# Patient Record
Sex: Female | Born: 1935
Health system: Southern US, Community
[De-identification: ages and names within clinical notes are randomized; demographics above are authoritative.]

## PROBLEM LIST (undated history)

## (undated) DIAGNOSIS — M7120 Synovial cyst of popliteal space [Baker], unspecified knee: Secondary | ICD-10-CM

## (undated) DIAGNOSIS — Z85528 Personal history of other malignant neoplasm of kidney: Secondary | ICD-10-CM

## (undated) DIAGNOSIS — I2699 Other pulmonary embolism without acute cor pulmonale: Secondary | ICD-10-CM

## (undated) DIAGNOSIS — I10 Essential (primary) hypertension: Secondary | ICD-10-CM

## (undated) DIAGNOSIS — E7800 Pure hypercholesterolemia, unspecified: Secondary | ICD-10-CM

## (undated) DIAGNOSIS — M1990 Unspecified osteoarthritis, unspecified site: Secondary | ICD-10-CM

## (undated) DIAGNOSIS — J189 Pneumonia, unspecified organism: Secondary | ICD-10-CM

## (undated) DIAGNOSIS — N289 Disorder of kidney and ureter, unspecified: Secondary | ICD-10-CM

## (undated) DIAGNOSIS — R112 Nausea with vomiting, unspecified: Secondary | ICD-10-CM

## (undated) DIAGNOSIS — Z9889 Other specified postprocedural states: Secondary | ICD-10-CM

## (undated) DIAGNOSIS — C801 Malignant (primary) neoplasm, unspecified: Secondary | ICD-10-CM

## (undated) DIAGNOSIS — I739 Peripheral vascular disease, unspecified: Secondary | ICD-10-CM

## (undated) DIAGNOSIS — J449 Chronic obstructive pulmonary disease, unspecified: Secondary | ICD-10-CM

## (undated) DIAGNOSIS — M4800 Spinal stenosis, site unspecified: Secondary | ICD-10-CM

## (undated) DIAGNOSIS — I82409 Acute embolism and thrombosis of unspecified deep veins of unspecified lower extremity: Secondary | ICD-10-CM

## (undated) HISTORY — PX: LEG SURGERY: SHX1003

## (undated) HISTORY — DX: Other pulmonary embolism without acute cor pulmonale: I26.99

## (undated) HISTORY — PX: BACK SURGERY: SHX140

## (undated) HISTORY — DX: Spinal stenosis, site unspecified: M48.00

## (undated) HISTORY — PX: HERNIA REPAIR: SHX51

## (undated) HISTORY — PX: APPENDECTOMY: SHX54

## (undated) HISTORY — PX: ABDOMINAL HYSTERECTOMY: SHX81

---

## 1999-07-06 ENCOUNTER — Encounter: Payer: Self-pay | Admitting: Urology

## 1999-07-06 ENCOUNTER — Encounter: Admission: RE | Admit: 1999-07-06 | Discharge: 1999-07-06 | Payer: Self-pay | Admitting: Urology

## 1999-07-14 ENCOUNTER — Encounter: Payer: Self-pay | Admitting: Urology

## 1999-07-15 ENCOUNTER — Ambulatory Visit (HOSPITAL_COMMUNITY): Admission: RE | Admit: 1999-07-15 | Discharge: 1999-07-15 | Payer: Self-pay | Admitting: Urology

## 2008-03-20 HISTORY — PX: KIDNEY SURGERY: SHX687

## 2010-11-21 ENCOUNTER — Emergency Department (HOSPITAL_COMMUNITY)
Admission: EM | Admit: 2010-11-21 | Discharge: 2010-11-21 | Disposition: A | Discharge status: Home / Self Care | Payer: Medicare Other | Attending: Emergency Medicine | Admitting: Emergency Medicine

## 2010-11-21 DIAGNOSIS — F17200 Nicotine dependence, unspecified, uncomplicated: Secondary | ICD-10-CM | POA: Insufficient documentation

## 2010-11-21 DIAGNOSIS — B029 Zoster without complications: Secondary | ICD-10-CM

## 2010-11-21 DIAGNOSIS — Z85528 Personal history of other malignant neoplasm of kidney: Secondary | ICD-10-CM | POA: Insufficient documentation

## 2010-11-21 DIAGNOSIS — I10 Essential (primary) hypertension: Secondary | ICD-10-CM | POA: Insufficient documentation

## 2010-11-21 DIAGNOSIS — Z7982 Long term (current) use of aspirin: Secondary | ICD-10-CM | POA: Insufficient documentation

## 2010-11-21 DIAGNOSIS — Z86718 Personal history of other venous thrombosis and embolism: Secondary | ICD-10-CM | POA: Insufficient documentation

## 2010-11-21 DIAGNOSIS — M79609 Pain in unspecified limb: Secondary | ICD-10-CM | POA: Insufficient documentation

## 2010-11-21 DIAGNOSIS — E7800 Pure hypercholesterolemia, unspecified: Secondary | ICD-10-CM | POA: Insufficient documentation

## 2010-11-21 HISTORY — DX: Personal history of other malignant neoplasm of kidney: Z85.528

## 2010-11-21 HISTORY — DX: Acute embolism and thrombosis of unspecified deep veins of unspecified lower extremity: I82.409

## 2010-11-21 HISTORY — DX: Essential (primary) hypertension: I10

## 2010-11-21 MED ORDER — VALACYCLOVIR HCL 1 G PO TABS: 1000.0000 mg | ORAL_TABLET | Freq: Three times a day (TID) | ORAL | Status: AC

## 2010-11-21 MED ORDER — PREDNISONE 10 MG PO TABS: ORAL_TABLET | ORAL | Status: AC

## 2010-11-21 MED ORDER — HYDROCODONE-ACETAMINOPHEN 5-325 MG PO TABS: 2.0000 | ORAL_TABLET | Freq: Four times a day (QID) | ORAL | Status: AC | PRN

## 2010-11-21 MED ORDER — FAMOTIDINE 20 MG PO TABS: 20.0000 mg | ORAL_TABLET | Freq: Two times a day (BID) | ORAL | Status: DC

## 2010-11-21 NOTE — ED Notes (Signed)
Pt has a hx of a blood clot to her rt leg.  Pt woke this a.m with severe pain to her left leg.  Daughter noticed that her foot was colder to touch than the other.  Pt also reports a bruise to the back of her left leg that wasn't there yesterday.

## 2010-11-21 NOTE — ED Notes (Signed)
Pt has noted red area approx size of quarter on distal knee. Pt states this just started yesterday. Is painful like stinging at times.

## 2010-11-21 NOTE — ED Provider Notes (Signed)
Scribed for Dr. Tomi Bamberger, the patient was seen in room 17. This chart was scribed by Hortense Ramal. This patient's care was started at 15:00.   History    CSN: [id number] Arrival date & time: 11/21/2010  2:07 PM  Chief Complaint  Patient presents with  . Leg Pain   Patient is a 75 y.o. female presenting with leg pain. The history is provided by the patient.  Leg Pain  Pertinent negatives include no numbness, no inability to bear weight, no loss of motion, no muscle weakness, no loss of sensation and no tingling.    NECOLA TROLL is a 75 y.o. female who presents to the Emergency Department complaining of left leg pain. She has had some pain in this leg chronically and dx with osteoarthritis. The pain in her left calf was worse when she woke up this morning and found a red, flat rash at the site of pain. She denies associated fevers, she is ambulatory and pain is not worse with walking.   She has a hx of DVT in vein of right leg with surgery and stent placement 2010 at [facility] ( the vein was a vein donated by her left leg), Fem Pop bypass on right ; cancer in both kidneys, she has NKDA. PCP: Dr. Delphina Cahill (cardio, Dr. Meredith Pel at [facility]).  PT reports having chicken pox as a child.   Daughter is concerned she has another clot in her leg. Pt has been off plavix and has been on ASA 325 mg a day.   Past Medical History  Diagnosis Date  . History of kidney cancer   . Hypertension   . DVT (deep venous thrombosis)   Peripheral vascular disease High cholesterol   Past Surgical History  Procedure Date  . Abdominal hysterectomy   . Hernia repair   . Appendectomy   . Kidney surgery     History  Substance Use Topics  . Smoking status: Current Everyday Smoker  . Smokeless tobacco: Not on file  . Alcohol Use: No  No drug use  Medications ASA 325 mg daily Simvastin 20 mg daily Lisinopril/HCTZ 20/12.5 mg daily  Review of Systems  Constitutional: Negative for fever.    Respiratory: Negative for shortness of breath.   Cardiovascular: Negative for chest pain.  Musculoskeletal: Positive for arthralgias (chronically). Negative for back pain, joint swelling and gait problem.  Neurological: Negative for tingling and numbness.  All other systems reviewed and are negative.    Physical Exam  BP 117/56  Pulse 79  Temp(Src) 98.6 F (37 C) (Oral)  Resp 18  Ht 8" (0.203 m)  Wt 168 lb (76.204 kg)  BMI 1,845.57 kg/m2  SpO2 99%  Physical Exam  Nursing note and vitals reviewed. Constitutional: She is oriented to person, place, and time. She appears well-developed and well-nourished.       Awake, alert, nontoxic appearance.  HENT:  Head: Normocephalic and atraumatic.  Eyes: Conjunctivae and EOM are normal. Pupils are equal, round, and reactive to light. Right eye exhibits no discharge. Left eye exhibits no discharge.  Neck: Normal range of motion. Neck supple.  Cardiovascular: Normal rate, regular rhythm and normal heart sounds.   No murmur heard. Pulmonary/Chest: Effort normal. No respiratory distress. She has no wheezes. She has no rales. She exhibits no tenderness.  Abdominal: There is no tenderness. There is no rebound.  Musculoskeletal: She exhibits no tenderness.       Baseline ROM, no obvious new focal weakness. Her back is without tenderness  or  visible rash. Pt has a cluster of purplish discoloration of the posterior left calf with small purplish round areas surrounding it that appears to be early shingles. Has a red area lateral to her left knee and two small red areas on the prox medial thigh.Both feet are cool to touch and get warmer as get closer to the knee, no discoloration, toes not pale, cyanotic. Pulses palpable. No swelling in her calf, foot, ankles.   Neurological: She is alert and oriented to person, place, and time.       Mental status and motor strength appears baseline for patient and situation.  Skin: Skin is warm, dry and intact. Rash  noted.       See muscular exam  Psychiatric: She has a normal mood and affect.        ED Course  Procedures  DIAGNOSTIC STUDIES: Oxygen Saturation is 99% on room air, normal by my interpretation.    PROCEDURES: Doppler pulses done by nursing staff and are present.  16:25. Discussed discharge instructions and course of care with patient and family at bedside.  MDM: I feel this rash is most consistant with early shingles. She does not have acute vascular compromise on exam.  IMPRESSION: Diagnoses that have been ruled out:  Diagnoses that are still under consideration:  Final diagnoses:  Shingles    PLAN:  Discharge home I have reviewed the discharge instructions with the patient  CONDITION ON DISCHARGE: stable  DISCHARGE MEDICATIONS: New Prescriptions   FAMOTIDINE (PEPCID) 20 MG TABLET    Take 1 tablet (20 mg total) by mouth 2 (two) times daily.   HYDROCODONE-ACETAMINOPHEN (NORCO) 5-325 MG PER TABLET    Take 2 tablets by mouth every 6 (six) hours as needed for pain.   PREDNISONE (DELTASONE) 10 MG TABLET    Take 3 po QD x 3 d then 2 po QD x 3 d then 1 po QD x 3d   VALACYCLOVIR (VALTREX) 1000 MG TABLET    Take 1 tablet (1,000 mg total) by mouth 3 (three) times daily.    I personally performed the services described in this documentation, which was scribed in my presence. The recorded information has been reviewed and considered. Rolland Porter, MD, Abram Sander        Janice Norrie, MD 11/21/10 [phone]

## 2010-11-21 NOTE — ED Notes (Signed)
Pt presents with lt leg pain; states is in her calf, pulses equally strong bilaterally with feet cool to touch. 2010 pt reports DVT in rt leg. Pt states leg continues to ache and pain has been increasing.  No warmth or redness noted in lt leg.

## 2010-12-19 DIAGNOSIS — I82409 Acute embolism and thrombosis of unspecified deep veins of unspecified lower extremity: Secondary | ICD-10-CM

## 2010-12-19 DIAGNOSIS — I2699 Other pulmonary embolism without acute cor pulmonale: Secondary | ICD-10-CM

## 2010-12-19 HISTORY — DX: Other pulmonary embolism without acute cor pulmonale: I26.99

## 2010-12-19 HISTORY — DX: Acute embolism and thrombosis of unspecified deep veins of unspecified lower extremity: I82.409

## 2011-01-09 ENCOUNTER — Encounter (HOSPITAL_COMMUNITY): Payer: Self-pay | Admitting: Emergency Medicine

## 2011-01-09 ENCOUNTER — Inpatient Hospital Stay (HOSPITAL_COMMUNITY)
Admission: EM | Admit: 2011-01-09 | Discharge: 2011-01-14 | DRG: 176 | Disposition: A | Discharge status: Home / Self Care | Payer: Medicare Other | Attending: Internal Medicine | Admitting: Internal Medicine

## 2011-01-09 ENCOUNTER — Emergency Department (HOSPITAL_COMMUNITY): Payer: Medicare Other

## 2011-01-09 ENCOUNTER — Other Ambulatory Visit: Payer: Self-pay

## 2011-01-09 DIAGNOSIS — Z905 Acquired absence of kidney: Secondary | ICD-10-CM

## 2011-01-09 DIAGNOSIS — I824Y9 Acute embolism and thrombosis of unspecified deep veins of unspecified proximal lower extremity: Secondary | ICD-10-CM | POA: Diagnosis present

## 2011-01-09 DIAGNOSIS — R1110 Vomiting, unspecified: Secondary | ICD-10-CM | POA: Diagnosis present

## 2011-01-09 DIAGNOSIS — Z85528 Personal history of other malignant neoplasm of kidney: Secondary | ICD-10-CM

## 2011-01-09 DIAGNOSIS — I517 Cardiomegaly: Secondary | ICD-10-CM

## 2011-01-09 DIAGNOSIS — I2699 Other pulmonary embolism without acute cor pulmonale: Principal | ICD-10-CM

## 2011-01-09 DIAGNOSIS — T40605A Adverse effect of unspecified narcotics, initial encounter: Secondary | ICD-10-CM | POA: Diagnosis present

## 2011-01-09 DIAGNOSIS — I10 Essential (primary) hypertension: Secondary | ICD-10-CM | POA: Diagnosis present

## 2011-01-09 DIAGNOSIS — Z86718 Personal history of other venous thrombosis and embolism: Secondary | ICD-10-CM

## 2011-01-09 DIAGNOSIS — R112 Nausea with vomiting, unspecified: Secondary | ICD-10-CM | POA: Diagnosis present

## 2011-01-09 DIAGNOSIS — R109 Unspecified abdominal pain: Secondary | ICD-10-CM | POA: Diagnosis present

## 2011-01-09 DIAGNOSIS — E785 Hyperlipidemia, unspecified: Secondary | ICD-10-CM | POA: Diagnosis present

## 2011-01-09 DIAGNOSIS — M5430 Sciatica, unspecified side: Secondary | ICD-10-CM | POA: Diagnosis present

## 2011-01-09 DIAGNOSIS — N179 Acute kidney failure, unspecified: Secondary | ICD-10-CM | POA: Diagnosis present

## 2011-01-09 DIAGNOSIS — C649 Malignant neoplasm of unspecified kidney, except renal pelvis: Secondary | ICD-10-CM | POA: Insufficient documentation

## 2011-01-09 HISTORY — DX: Pure hypercholesterolemia, unspecified: E78.00

## 2011-01-09 LAB — URINALYSIS, ROUTINE W REFLEX MICROSCOPIC
Glucose, UA: NEGATIVE mg/dL
Hgb urine dipstick: NEGATIVE
Ketones, ur: NEGATIVE mg/dL
Protein, ur: NEGATIVE mg/dL
pH: 6 (ref 5.0–8.0)

## 2011-01-09 LAB — HEPATIC FUNCTION PANEL
ALT: 7 U/L (ref 0–35)
AST: 16 U/L (ref 0–37)
Alkaline Phosphatase: 99 U/L (ref 39–117)
Bilirubin, Direct: 0.2 mg/dL (ref 0.0–0.3)
Indirect Bilirubin: 0.4 mg/dL (ref 0.3–0.9)
Total Protein: 7.7 g/dL (ref 6.0–8.3)

## 2011-01-09 LAB — DIFFERENTIAL
Eosinophils Relative: 1 % (ref 0–5)
Lymphocytes Relative: 11 % — ABNORMAL LOW (ref 12–46)
Lymphs Abs: 0.8 10*3/uL (ref 0.7–4.0)
Monocytes Absolute: 0.7 10*3/uL (ref 0.1–1.0)
Monocytes Relative: 10 % (ref 3–12)

## 2011-01-09 LAB — BASIC METABOLIC PANEL
CO2: 26 mEq/L (ref 19–32)
Calcium: 9.7 mg/dL (ref 8.4–10.5)
Creatinine, Ser: 1.78 mg/dL — ABNORMAL HIGH (ref 0.50–1.10)
Glucose, Bld: 127 mg/dL — ABNORMAL HIGH (ref 70–99)

## 2011-01-09 LAB — CARDIAC PANEL(CRET KIN+CKTOT+MB+TROPI)
Relative Index: INVALID (ref 0.0–2.5)
Total CK: 71 U/L (ref 7–177)

## 2011-01-09 LAB — CBC
HCT: 37.5 % (ref 36.0–46.0)
Hemoglobin: 12.2 g/dL (ref 12.0–15.0)
MCV: 94.2 fL (ref 78.0–100.0)
RBC: 3.98 MIL/uL (ref 3.87–5.11)
WBC: 7.3 10*3/uL (ref 4.0–10.5)

## 2011-01-09 LAB — APTT: aPTT: 35 seconds (ref 24–37)

## 2011-01-09 MED ORDER — BIOTENE DRY MOUTH MT LIQD
Freq: Two times a day (BID) | OROMUCOSAL | Status: DC
  Administered 2011-01-09 – 2011-01-12 (×5): via OROMUCOSAL
  Administered 2011-01-12: 1 via OROMUCOSAL
  Administered 2011-01-13: 08:00:00 via OROMUCOSAL
  Administered 2011-01-13: 1 via OROMUCOSAL
  Administered 2011-01-14: 08:00:00 via OROMUCOSAL

## 2011-01-09 MED ORDER — WARFARIN SODIUM 7.5 MG PO TABS
7.5000 mg | ORAL_TABLET | Freq: Once | ORAL | Status: AC
  Administered 2011-01-09: 7.5 mg via ORAL
  Filled 2011-01-09: qty 1

## 2011-01-09 MED ORDER — SODIUM CHLORIDE 0.9 % IJ SOLN
INTRAMUSCULAR | Status: AC
  Administered 2011-01-09: 3 mL
  Filled 2011-01-09: qty 3

## 2011-01-09 MED ORDER — ASPIRIN EC 325 MG PO TBEC
325.0000 mg | DELAYED_RELEASE_TABLET | Freq: Every day | ORAL | Status: DC
  Administered 2011-01-09 – 2011-01-11 (×3): 325 mg via ORAL
  Filled 2011-01-09 (×3): qty 1

## 2011-01-09 MED ORDER — SODIUM CHLORIDE 0.9 % IV SOLN: Freq: Once | INTRAVENOUS | Status: DC

## 2011-01-09 MED ORDER — SIMVASTATIN 20 MG PO TABS
20.0000 mg | ORAL_TABLET | Freq: Every day | ORAL | Status: DC
  Administered 2011-01-09 – 2011-01-13 (×5): 20 mg via ORAL
  Filled 2011-01-09 (×5): qty 1

## 2011-01-09 MED ORDER — ONDANSETRON HCL 4 MG PO TABS
4.0000 mg | ORAL_TABLET | Freq: Four times a day (QID) | ORAL | Status: DC | PRN
  Filled 2011-01-09: qty 1

## 2011-01-09 MED ORDER — ACETAMINOPHEN 650 MG RE SUPP: 650.0000 mg | Freq: Four times a day (QID) | RECTAL | Status: DC | PRN

## 2011-01-09 MED ORDER — ACETAMINOPHEN 325 MG PO TABS
650.0000 mg | ORAL_TABLET | Freq: Four times a day (QID) | ORAL | Status: DC | PRN
  Administered 2011-01-11 – 2011-01-14 (×6): 650 mg via ORAL
  Filled 2011-01-09 (×6): qty 2

## 2011-01-09 MED ORDER — IOHEXOL 300 MG/ML  SOLN
80.0000 mL | Freq: Once | INTRAMUSCULAR | Status: AC | PRN
  Administered 2011-01-09: 80 mL via INTRAVENOUS

## 2011-01-09 MED ORDER — HEPARIN BOLUS VIA INFUSION
4000.0000 [IU] | Freq: Once | INTRAVENOUS | Status: AC
  Administered 2011-01-09: 4000 [IU] via INTRAVENOUS
  Filled 2011-01-09: qty 4000

## 2011-01-09 MED ORDER — HEPARIN BOLUS VIA INFUSION: 4000.0000 [IU] | Freq: Once | INTRAVENOUS | Status: DC

## 2011-01-09 MED ORDER — HYDROCODONE-ACETAMINOPHEN 5-325 MG PO TABS
1.0000 | ORAL_TABLET | ORAL | Status: DC | PRN
  Administered 2011-01-09 (×2): 1 via ORAL
  Administered 2011-01-10 (×2): 2 via ORAL
  Filled 2011-01-09: qty 1
  Filled 2011-01-09 (×2): qty 2
  Filled 2011-01-09: qty 1

## 2011-01-09 MED ORDER — HEPARIN (PORCINE) IN NACL 100-0.45 UNIT/ML-% IJ SOLN
1300.0000 [IU]/h | INTRAMUSCULAR | Status: DC
  Administered 2011-01-10 – 2011-01-12 (×3): 1100 [IU]/h via INTRAVENOUS
  Administered 2011-01-13 (×2): 1300 [IU]/h via INTRAVENOUS
  Filled 2011-01-09 (×5): qty 250

## 2011-01-09 MED ORDER — SODIUM CHLORIDE 0.9 % IV SOLN: INTRAVENOUS | Status: AC

## 2011-01-09 MED ORDER — HYDROMORPHONE HCL 1 MG/ML IJ SOLN
1.0000 mg | Freq: Once | INTRAMUSCULAR | Status: AC
  Administered 2011-01-09: 1 mg via INTRAVENOUS
  Filled 2011-01-09: qty 1

## 2011-01-09 MED ORDER — SODIUM CHLORIDE 0.45 % IV SOLN: INTRAVENOUS | Status: DC

## 2011-01-09 MED ORDER — ONDANSETRON HCL 4 MG/2ML IJ SOLN
4.0000 mg | Freq: Four times a day (QID) | INTRAMUSCULAR | Status: DC | PRN
  Administered 2011-01-10 (×2): 4 mg via INTRAVENOUS
  Filled 2011-01-09 (×2): qty 2

## 2011-01-09 MED ORDER — ONDANSETRON HCL 4 MG/2ML IJ SOLN
4.0000 mg | Freq: Once | INTRAMUSCULAR | Status: AC
  Administered 2011-01-09: 4 mg via INTRAVENOUS
  Filled 2011-01-09: qty 2

## 2011-01-09 MED ORDER — SODIUM CHLORIDE 0.9 % IV BOLUS (SEPSIS)
1000.0000 mL | Freq: Once | INTRAVENOUS | Status: AC
  Administered 2011-01-09: 1000 mL via INTRAVENOUS

## 2011-01-09 MED ORDER — FAMOTIDINE 20 MG PO TABS
20.0000 mg | ORAL_TABLET | Freq: Two times a day (BID) | ORAL | Status: DC | PRN
  Filled 2011-01-09 (×2): qty 1

## 2011-01-09 MED ORDER — ONDANSETRON HCL 4 MG/2ML IJ SOLN
4.0000 mg | Freq: Once | INTRAMUSCULAR | Status: AC
Start: 2011-01-09 — End: 2011-01-09
  Administered 2011-01-09: 4 mg via INTRAVENOUS
  Filled 2011-01-09: qty 2

## 2011-01-09 MED ORDER — FAMOTIDINE 20 MG PO TABS
20.0000 mg | ORAL_TABLET | Freq: Two times a day (BID) | ORAL | Status: DC
  Administered 2011-01-09 – 2011-01-14 (×11): 20 mg via ORAL
  Filled 2011-01-09 (×9): qty 1

## 2011-01-09 MED ORDER — HEPARIN (PORCINE) IN NACL 100-0.45 UNIT/ML-% IJ SOLN
18.0000 [IU]/kg/h | INTRAMUSCULAR | Status: DC
  Administered 2011-01-09: 18 [IU]/kg/h via INTRAVENOUS
  Filled 2011-01-09: qty 250

## 2011-01-09 NOTE — ED Notes (Signed)
Pt taken to ct. Nad.  

## 2011-01-09 NOTE — ED Notes (Signed)
Dr Warnell Forester talking with family now.

## 2011-01-09 NOTE — Progress Notes (Signed)
ANTICOAGULATION CONSULT NOTE - Initial Consult  Pharmacy Consult for Heparin and Warfarin Indication: pulmonary embolus  No Known Allergies  Patient Measurements: Height: 5\' 5"  (165.1 cm) Weight: 169 lb 8 oz (76.885 kg) IBW/kg (Calculated) : 57  Adjusted Body Weight:77kg  Vital Signs: Temp: 98 F (36.7 C) (10/22 1351) Temp src: Oral (10/22 0627) BP: 156/79 mmHg (10/22 1351) Pulse Rate: 77  (10/22 1351)  Labs:  Basename 01/09/11 1019 01/09/11 0630  HGB -- 12.2  HCT -- 37.5  PLT -- 147*  APTT 35 --  LABPROT 14.5 --  INR 1.11 --  HEPARINUNFRC -- --  CREATININE -- 1.78*  CKTOTAL -- --  CKMB -- --  TROPONINI -- --   Estimated Creatinine Clearance: 29.3 ml/min (by C-G formula based on Cr of 1.78).  Medical History: Past Medical History  Diagnosis Date  . History of kidney cancer   . Hypertension   . DVT (deep venous thrombosis)   . Hypercholesteremia   . Osteoporosis     Medications:   Reviewed  Assessment: Initial anticoagulation for PE. Heparin started in ED  Goal of Therapy:  INR 2-3 Heparin level 0.3- 0.7    Plan:   Heparin 4000 units bolus followed by 1400 units per hour. Warfarin 7.5mg  today. Minimum 5 day overlap with INR over 2.0 for 24 hours, Daily Protime Daily Heparin level. Daily CBC  Lisa Lucas, [place] J 01/09/2011,2:22 PM

## 2011-01-09 NOTE — Progress Notes (Signed)
ANTICOAGULATION CONSULT NOTE - Initial Consult  Pharmacy Consult for Heparin Indication: pulmonary embolus  No Known Allergies  Patient Measurements: Height: 5' 5.5" (166.4 cm) Weight: 169 lb (76.658 kg) IBW/kg (Calculated) : 58.15  Adjusted Body Weight: 76  Vital Signs: Temp: 98.6 F (37 C) (10/22 0627) Temp src: Oral (10/22 0627) BP: 123/64 mmHg (10/22 0921) Pulse Rate: 74  (10/22 0921)  Labs:  Basename 01/09/11 0630  HGB 12.2  HCT 37.5  PLT 147*  APTT --  LABPROT --  INR --  HEPARINUNFRC --  CREATININE 1.78*  CKTOTAL --  CKMB --  TROPONINI --   Estimated Creatinine Clearance: 29.6 ml/min (by C-G formula based on Cr of 1.78).  Medical History: Past Medical History  Diagnosis Date  . History of kidney cancer   . Hypertension   . DVT (deep venous thrombosis)   . Hypercholesteremia   . Osteoporosis     Medications:  Medications Prior to Admission  Medication Dose Route Frequency Provider Last Rate Last Dose  . 0.45 % sodium chloride infusion   Intravenous STAT Maudry Diego, MD      . 0.9 %  sodium chloride infusion   Intravenous Once Maudry Diego, MD      . heparin 100 units/mL bolus via infusion 4,000 Units  4,000 Units Intravenous Once Maudry Diego, MD      . HYDROmorphone (DILAUDID) injection 1 mg  1 mg Intravenous Once Maudry Diego, MD   1 mg at 01/09/11 0739  . iohexol (OMNIPAQUE) 300 MG/ML injection 80 mL  80 mL Intravenous Once PRN Hind I. Elsaid   80 mL at 01/09/11 0853  . ondansetron (ZOFRAN) injection 4 mg  4 mg Intravenous Once Maudry Diego, MD   4 mg at 01/09/11 0738  . ondansetron (ZOFRAN) injection 4 mg  4 mg Intravenous Once Maudry Diego, MD   4 mg at 01/09/11 1014  . sodium chloride 0.9 % bolus 1,000 mL  1,000 mL Intravenous Once Hind I. Elsaid   1,000 mL at 01/09/11 [id number]   Medications Prior to Admission  Medication Sig Dispense Refill  . aspirin 325 MG EC tablet Take 325 mg by mouth daily.        . Calcium  Carbonate-Vitamin D (CALCIUM 600 + D PO) Take 1 tablet by mouth daily.        . Calcium-Magnesium-Zinc 1000-400-15 MG TABS Take 1 tablet by mouth daily.        . famotidine (PEPCID) 20 MG tablet Take 1 tablet (20 mg total) by mouth 2 (two) times daily.  30 tablet  0  . lisinopril-hydrochlorothiazide (PRINZIDE,ZESTORETIC) 20-12.5 MG per tablet Take 1 tablet by mouth daily.        Marland Kitchen OVER THE COUNTER MEDICATION Apply 1 patch topically daily as needed. OTC: FAMILY CARE PAIN RELIEF PATCH: for pain       . simvastatin (ZOCOR) 20 MG tablet Take 20 mg by mouth at bedtime.        . valACYclovir (VALTREX) 1000 MG tablet Take 1 tablet (1,000 mg total) by mouth 3 (three) times daily.  21 tablet  0    Assessment: Okay for Protocol No baseline coag labs  Goal of Therapy:  Heparin level 0.3-0.7 units/ml   Plan:  4000 units bolus Drip @ 18 units/kg/hr. Check Heparin Level and CBC in 8 hours and daily while on Heparin. Baseline coag labs.  Biagio Quint R 01/09/2011,10:12 AM

## 2011-01-09 NOTE — H&P (Signed)
PCP:   Wende Neighbors, MD   Chief Complaint:  Right flank pain and shortness of breath for 2 days  HPI: This 75 year old female with history of bilateral kidney cancer , sp bilateral partial nephrectomy ,done at [facility] on 2010,history of DVT with some bad reaction to coumadin per daughter, accordingly placed on aspirin and plavix and currently the patient only on aspirin, patient today with sudden onset right flank pain associated with shortness of breath, no nausea or vomiting , no burning micturition, has act scan of abdomen done with contrast which did show pulmonary embolism. Review of Systems:  The patient denies anorexia, fever, weight loss,, vision loss, decreased hearing, hoarseness, chest pain, syncope,  she complains of dyspnea and unable to complete sentence, she also has left leg pain where further investigated at [facility] with as per daughter no clot was found and her peripheral circulation is good, peripheral edema,patient denies any balance deficits, hemoptysis, abdominal pain, melena, hematochezia, severe indigestion/heartburn, hematuria, incontinence, genital sores, muscle weakness, suspicious skin lesions, transient blindness, difficulty walking, depression, unusual weight change, abnormal bleeding, enlarged lymph nodes, angioedema, and breast masses.  Past Medical History: Past Medical History  Diagnosis Date  . History of kidney cancer   . Hypertension   . DVT (deep venous thrombosis)   . Hypercholesteremia   . Osteoporosis History of peripheral vascular disease . Marland Kitchen     Past Surgical History  Procedure Date  . Abdominal hysterectomy   . Hernia repair   . Appendectomy   . Kidney surgery     Medications: Prior to Admission medications   Medication Sig Start Date End Date Taking? Authorizing Provider  aspirin 325 MG EC tablet Take 325 mg by mouth daily.     Yes Historical Provider, MD  Calcium Carbonate-Vitamin D (CALCIUM 600 + D PO) Take 1 tablet by  mouth daily.     Yes Historical Provider, MD  Calcium-Magnesium-Zinc 1000-400-15 MG TABS Take 1 tablet by mouth daily.     Yes Historical Provider, MD  famotidine (PEPCID) 20 MG tablet Take 20 mg by mouth 2 (two) times daily as needed. gas  11/21/10 11/21/11 Yes Janice Norrie, MD  lisinopril-hydrochlorothiazide (PRINZIDE,ZESTORETIC) 20-12.5 MG per tablet Take 1 tablet by mouth daily.     Yes Historical Provider, MD  OVER THE COUNTER MEDICATION Apply 1 patch topically daily as needed. OTC: FAMILY CARE PAIN RELIEF PATCH: for pain    Yes Historical Provider, MD  simvastatin (ZOCOR) 20 MG tablet Take 20 mg by mouth at bedtime.     Yes Historical Provider, MD  valACYclovir (VALTREX) 1000 MG tablet Take 1 tablet (1,000 mg total) by mouth 3 (three) times daily. 11/21/10 11/21/11  Janice Norrie, MD    Allergies:  No Known Allergies  Social History:  reports that she quit smoking about 4 weeks ago. She does not have any smokeless tobacco history on file. She reports that she does not drink alcohol or use illicit drugs.lives with her daughter , she is a widow  Family History: history of hypertension, diabetes Physical Exam:lying on bed ,not on respiratory distress or active shortness brearh HEENT: no IVP, NO lymph node LUNG: NORMAL VESICULAR BREATHING  HEART": S1and S2 , no gallop ,no murmur ABD: soft non tender ,bowel sound present. No rebound   Extremities: no edema. No cyanosis , pulse intact CNS: awake and fully oriented , no neuro deficit.    Filed Vitals:   01/09/11 [id number] 01/09/11 [id number] 01/09/11 [id number] 01/09/11 1127  BP: 144/85 123/64  103/52  Pulse: 91 74  74  Temp: 98.6 F (37 C)     TempSrc: Oral     Resp:  19  18  Height: 5\' 8"  (1.727 m)  5' 5.5" (1.664 m)   Weight: 74.844 kg (165 lb)  76.658 kg (169 lb)   SpO2: 96% 97%  98%      Labs on Admission:   [facility] 01/09/11 0630  NA 136  K 4.6  CL 101  CO2 26  GLUCOSE 127*  BUN 30*  CREATININE 1.78*  CALCIUM 9.7  MG --  PHOS --     Basename 01/09/11 0732  AST 16  ALT 7  ALKPHOS 99  BILITOT 0.6  PROT 7.7  ALBUMIN 3.6    Basename 01/09/11 0732  LIPASE 23  AMYLASE --    Basename 01/09/11 0630  WBC 7.3  NEUTROABS 5.8  HGB 12.2  HCT 37.5  MCV 94.2  PLT 147*   No results found for this basename: CKTOTAL:3,CKMB:3,CKMBINDEX:3,TROPONINI:3 in the last 72 hours No results found for this basename: TSH,T4TOTAL,FREET3,T3FREE,THYROIDAB in the last 72 hours No results found for this basename: VITAMINB12:2,FOLATE:2,FERRITIN:2,TIBC:2,IRON:2,RETICCTPCT:2 in the last 72 hours  Radiological Exams on Admission: Ct Abdomen Pelvis W Contrast  01/09/2011  *RADIOLOGY REPORT*  Clinical Data: RLQ/right flank pain  CT ABDOMEN AND PELVIS WITH CONTRAST  Technique:  Multidetector CT imaging of the abdomen and pelvis was performed following the standard protocol during bolus administration of intravenous contrast.  Contrast: 79mL OMNIPAQUE IOHEXOL 300 MG/ML IV SOLN  Comparison: None.  Findings: Pulmonary embolus at the bifurcation of the main pulmonary artery extending to the right lower lobe pulmonary artery (series 2/image 3).  Mild patchy opacity in the right lower lobe.  Contrast within the distal esophagus, possibly reflecting esophageal dysmotility or gastroesophageal reflux.  Liver, pancreas, and adrenal glands are grossly unremarkable.  2.3 x 3.0 cm hypoenhancing lesion in the spleen (series 2/image 27), incompletely characterized, likely benign hemangioma or lymphangioma.  Cholelithiasis (series 2/image 29), without associated inflammatory changes.  1.5 x 2.3 cm cystic lesion minimally complicated by a rim calcification in the anterior left upper pole (series 2/image 41). Additional tiny probable renal cysts and areas of cortical scarring/atrophy.  No renal calculi or hydronephrosis.  No evidence of bowel obstruction.  Appendix is not discretely visualized and may be surgically absent.  Extensive colonic diverticulosis, without  associated inflammatory changes.  Atherosclerotic calcifications of the abdominal aorta and branch vessels.  No abdominopelvic ascites.  No suspicious abdominopelvic lymphadenopathy.  Status post hysterectomy.  No adnexal masses.  No ureteral or bladder calculi.  Large left bladder diverticulum.  At least three midline/right paramidline ventral hernias containing fat.  Mild degenerative changes of the visualized thoracolumbar spine.  IMPRESSION:  Pulmonary embolus at the bifurcation of the main pulmonary artery and extending into the right lower lobe pulmonary artery.  Critical Value/emergent results were called by telephone at the time of interpretation on 01/09/2011  at 0900 hours  to  Dr. Milton Ferguson, who verbally acknowledged these results.  Original Report Authenticated By: Julian Hy, M.D.    Assessment/Plan Present on Admission:  .Pulmonary embolism: IV heparin and coumadin will be started , will observe any reaction to coumadin ,she need to be on long term Anticoagulation if she develop reaction will switch to Lovenox. .Abdominal  pain, other specified site: likely referral from her pulmonary embolism , she has cyst on the kidney will get [place] of kidney , and venous doppler of  lower extremities  .Renal failure, acute baseline unknown , monitor  Vomiting likely reaction to dilaudid , Zofran added .HTN (hypertension)home medications  Ary Lavine I. 01/09/2011, 12:04 PM

## 2011-01-09 NOTE — ED Notes (Signed)
Per EMS - pt from home - pt c/o rt abd/flank pain and shortness of breath that began approx 2 days ago.

## 2011-01-09 NOTE — ED Notes (Signed)
Daughter had questions. Advised Dr. Warnell Forester to come down and address family's questions.

## 2011-01-09 NOTE — Progress Notes (Signed)
*  PRELIMINARY RESULTS* Echocardiogram 2D Echocardiogram has been performed.  Tera Partridge 01/09/2011, 2:33 PM

## 2011-01-09 NOTE — ED Notes (Signed)
Called Dr. Laurie Panda  Hospitalist.

## 2011-01-09 NOTE — ED Provider Notes (Signed)
History   This chart was scribed for Maudry Diego, MD by Carolyne Littles. The patient was seen in room APA05/APA05 and the patient's care was started at 7:19AM.   CSN: [id number] Arrival date & time: 01/09/2011  6:33 AM   First MD Initiated Contact with Patient 01/09/11 [phone]      Chief Complaint  Patient presents with  . Abdominal Pain  . Flank Pain  . Shortness of Breath   HPI Lisa Lucas is a 75 y.o. female who presents to the Emergency Department complaining of constant,non-radiating sharp RLQ abdominal and right flank pain onset yesterday and gradually worsening since with associated mild productive cough. Notes abdominal pain is aggravated by palpation and deep breathing and relieved by nothing. Denies dysuria, nausea, vomiting, diarrhea. Patient's family reports h/o appendectomy and bilateral partial nephrectomies performed at [facility].   PCP- Dr. Nevada Crane  HPI ELEMENTS:  Location: RLQ and right flank  Onset: yesterday Duration: persistent since onset  Timing: constant  Quality: sharp  Modifying factors: agg by deep breath and palpation Context:  as above  Associated symptoms: +productive cough. Denies dysuria.    Past Medical History  Diagnosis Date  . History of kidney cancer   . Hypertension   . DVT (deep venous thrombosis)   . Hypercholesteremia   . Osteoporosis     Past Surgical History  Procedure Date  . Abdominal hysterectomy   . Hernia repair   . Appendectomy   . Kidney surgery     No family history on file.  History  Substance Use Topics  . Smoking status: Former Smoker    Quit date: 12/10/2010  . Smokeless tobacco: Not on file  . Alcohol Use: No    OB History    Grav Para Term Preterm Abortions TAB SAB Ect Mult Living                  Review of Systems  Constitutional: Negative for fatigue.  HENT: Negative for congestion, sinus pressure and ear discharge.   Eyes: Negative for discharge.  Respiratory: Positive for cough.     Cardiovascular: Negative for chest pain.  Gastrointestinal: Positive for abdominal pain. Negative for diarrhea.  Genitourinary: Negative for dysuria, frequency and hematuria.  Musculoskeletal: Negative for back pain.  Skin: Negative for rash.  Neurological: Negative for seizures and headaches.  Hematological: Negative.   Psychiatric/Behavioral: Negative for hallucinations.    Allergies  Review of patient's allergies indicates no known allergies.  Home Medications   Current Outpatient Rx  Name Route Sig Dispense Refill  . ASPIRIN 325 MG PO TBEC Oral Take 325 mg by mouth daily.      Marland Kitchen CALCIUM 600 + D PO Oral Take 1 tablet by mouth daily.      Marland Kitchen CALCIUM-MAGNESIUM-ZINC 1000-400-15 MG PO TABS Oral Take 1 tablet by mouth daily.      Marland Kitchen FAMOTIDINE 20 MG PO TABS Oral Take 1 tablet (20 mg total) by mouth 2 (two) times daily. 30 tablet 0  . LISINOPRIL-HYDROCHLOROTHIAZIDE 20-12.5 MG PO TABS Oral Take 1 tablet by mouth daily.      Marland Kitchen OVER THE COUNTER MEDICATION Topical Apply 1 patch topically daily as needed. OTC: FAMILY CARE PAIN RELIEF PATCH: for pain     . SIMVASTATIN 20 MG PO TABS Oral Take 20 mg by mouth at bedtime.      Marland Kitchen VALACYCLOVIR HCL 1 G PO TABS Oral Take 1 tablet (1,000 mg total) by mouth 3 (three) times daily. 21  tablet 0    BP 123/64  Pulse 74  Temp(Src) 98.6 F (37 C) (Oral)  Resp 19  Ht 5\' 8"  (1.727 m)  Wt 165 lb (74.844 kg)  BMI 25.09 kg/m2  SpO2 97%  Physical Exam  Nursing note and vitals reviewed. Constitutional: She is oriented to person, place, and time. She appears well-developed and well-nourished. No distress.  HENT:  Head: Normocephalic and atraumatic.  Eyes: EOM are normal. No scleral icterus.  Neck: Neck supple. No thyromegaly present.  Cardiovascular: Normal rate, regular rhythm and intact distal pulses.  Exam reveals no gallop and no friction rub.   No murmur heard. Pulmonary/Chest: Effort normal. No stridor. She has wheezes. She has no rales. She  exhibits no tenderness.  Abdominal: She exhibits no distension. There is tenderness (moderate) in the right lower quadrant. There is rebound.       Right flank tenderness.   Musculoskeletal: Normal range of motion. She exhibits no edema.  Lymphadenopathy:    She has no cervical adenopathy.  Neurological: She is alert and oriented to person, place, and time. Coordination normal.  Skin: Skin is warm and dry.  Psychiatric: She has a normal mood and affect. Her behavior is normal.    ED Course  Procedures (including critical care time)  DIAGNOSTIC STUDIES:   COORDINATION OF CARE: 9:33AM- Patient and family informed of lab and imaging results and cause of abdominal pain. Also informed of intent to admit to hospital and administer blood thinners to treat PE. Patient's family and patient agree with plan set forth at this time. Patient family member notes patient with h/o blood clots and recently had study performed to rule out DVT in bilateral lower legs following a surgical procedure.   Labs Reviewed  CBC - Abnormal; Notable for the following:    Platelets 147 (*)    All other components within normal limits  DIFFERENTIAL - Abnormal; Notable for the following:    Neutrophils Relative 79 (*)    Lymphocytes Relative 11 (*)    All other components within normal limits  BASIC METABOLIC PANEL - Abnormal; Notable for the following:    Glucose, Bld 127 (*)    BUN 30 (*)    Creatinine, Ser 1.78 (*)    GFR calc non Af Amer 27 (*)    GFR calc Af Amer 32 (*)    All other components within normal limits  URINALYSIS, ROUTINE W REFLEX MICROSCOPIC - Abnormal; Notable for the following:    Appearance HAZY (*)    All other components within normal limits  HEPATIC FUNCTION PANEL  LIPASE, BLOOD   Ct Abdomen Pelvis W Contrast  01/09/2011  *RADIOLOGY REPORT*  Clinical Data: RLQ/right flank pain  CT ABDOMEN AND PELVIS WITH CONTRAST  Technique:  Multidetector CT imaging of the abdomen and pelvis was  performed following the standard protocol during bolus administration of intravenous contrast.  Contrast: 38mL OMNIPAQUE IOHEXOL 300 MG/ML IV SOLN  Comparison: None.  Findings: Pulmonary embolus at the bifurcation of the main pulmonary artery extending to the right lower lobe pulmonary artery (series 2/image 3).  Mild patchy opacity in the right lower lobe.  Contrast within the distal esophagus, possibly reflecting esophageal dysmotility or gastroesophageal reflux.  Liver, pancreas, and adrenal glands are grossly unremarkable.  2.3 x 3.0 cm hypoenhancing lesion in the spleen (series 2/image 27), incompletely characterized, likely benign hemangioma or lymphangioma.  Cholelithiasis (series 2/image 29), without associated inflammatory changes.  1.5 x 2.3 cm cystic lesion minimally complicated by  a rim calcification in the anterior left upper pole (series 2/image 41). Additional tiny probable renal cysts and areas of cortical scarring/atrophy.  No renal calculi or hydronephrosis.  No evidence of bowel obstruction.  Appendix is not discretely visualized and may be surgically absent.  Extensive colonic diverticulosis, without associated inflammatory changes.  Atherosclerotic calcifications of the abdominal aorta and branch vessels.  No abdominopelvic ascites.  No suspicious abdominopelvic lymphadenopathy.  Status post hysterectomy.  No adnexal masses.  No ureteral or bladder calculi.  Large left bladder diverticulum.  At least three midline/right paramidline ventral hernias containing fat.  Mild degenerative changes of the visualized thoracolumbar spine.  IMPRESSION:  Pulmonary embolus at the bifurcation of the main pulmonary artery and extending into the right lower lobe pulmonary artery.  Critical Value/emergent results were called by telephone at the time of interpretation on 01/09/2011  at 0900 hours  to  Dr. Milton Ferguson, who verbally acknowledged these results.  Original Report Authenticated By: Julian Hy,  M.D.     No diagnosis found.    MDM  abd pain.  From pe  CRITICAL CARE Performed by: Brodan Grewell L   Total critical care time: 45 min  Critical care time was exclusive of separately billable procedures and treating other patients.  Critical care was necessary to treat or prevent imminent or life-threatening deterioration.  Critical care was time spent personally by me on the following activities: development of treatment plan with patient and/or surrogate as well as nursing, discussions with consultants, evaluation of patient's response to treatment, examination of patient, obtaining history from patient or surrogate, ordering and performing treatments and interventions, ordering and review of laboratory studies, ordering and review of radiographic studies, pulse oximetry and re-evaluation of patient's condition.     The chart was scribed for me under my direct supervision.  I personally performed the history, physical, and medical decision making and all procedures in the evaluation of this patient.Maudry Diego, MD 01/09/11 [phone]

## 2011-01-09 NOTE — ED Notes (Signed)
Pt resting. Waiting on result of test and to be eval by edp. Family at bedside

## 2011-01-10 ENCOUNTER — Inpatient Hospital Stay (HOSPITAL_COMMUNITY): Payer: Medicare Other

## 2011-01-10 LAB — PROTIME-INR
INR: 1.32 (ref 0.00–1.49)
Prothrombin Time: 16.6 seconds — ABNORMAL HIGH (ref 11.6–15.2)

## 2011-01-10 LAB — COMPREHENSIVE METABOLIC PANEL
BUN: 25 mg/dL — ABNORMAL HIGH (ref 6–23)
CO2: 26 mEq/L (ref 19–32)
Calcium: 9.2 mg/dL (ref 8.4–10.5)
Chloride: 102 mEq/L (ref 96–112)
Creatinine, Ser: 1.51 mg/dL — ABNORMAL HIGH (ref 0.50–1.10)
GFR calc Af Amer: 39 mL/min — ABNORMAL LOW (ref >90)
GFR calc non Af Amer: 33 mL/min — ABNORMAL LOW (ref >90)
Glucose, Bld: 97 mg/dL (ref 70–99)
Total Bilirubin: 0.5 mg/dL (ref 0.3–1.2)

## 2011-01-10 LAB — CBC
HCT: 32.5 % — ABNORMAL LOW (ref 36.0–46.0)
Hemoglobin: 10.5 g/dL — ABNORMAL LOW (ref 12.0–15.0)
MCH: 30.7 pg (ref 26.0–34.0)
MCHC: 32.3 g/dL (ref 30.0–36.0)
MCV: 95 fL (ref 78.0–100.0)
RBC: 3.42 MIL/uL — ABNORMAL LOW (ref 3.87–5.11)

## 2011-01-10 LAB — APTT: aPTT: 127 seconds — ABNORMAL HIGH (ref 24–37)

## 2011-01-10 MED ORDER — SODIUM CHLORIDE 0.9 % IJ SOLN
INTRAMUSCULAR | Status: AC
  Filled 2011-01-10: qty 3

## 2011-01-10 MED ORDER — BISACODYL 5 MG PO TBEC: 5.0000 mg | DELAYED_RELEASE_TABLET | Freq: Every day | ORAL | Status: DC | PRN

## 2011-01-10 MED ORDER — SENNOSIDES-DOCUSATE SODIUM 8.6-50 MG PO TABS: 1.0000 | ORAL_TABLET | Freq: Two times a day (BID) | ORAL | Status: DC | PRN

## 2011-01-10 MED ORDER — SODIUM CHLORIDE 0.9 % IJ SOLN
INTRAMUSCULAR | Status: AC
  Administered 2011-01-10: 3 mL
  Filled 2011-01-10: qty 3

## 2011-01-10 MED ORDER — HYDROMORPHONE HCL 1 MG/ML IJ SOLN
0.5000 mg | Freq: Four times a day (QID) | INTRAMUSCULAR | Status: DC | PRN
  Administered 2011-01-10 – 2011-01-12 (×2): 0.5 mg via INTRAVENOUS
  Filled 2011-01-10 (×2): qty 1

## 2011-01-10 MED ORDER — SODIUM CHLORIDE 0.9 % IJ SOLN
INTRAMUSCULAR | Status: AC
  Administered 2011-01-10: 11:00:00
  Filled 2011-01-10: qty 10

## 2011-01-10 MED ORDER — WARFARIN SODIUM 7.5 MG PO TABS
7.5000 mg | ORAL_TABLET | Freq: Once | ORAL | Status: AC
  Administered 2011-01-10: 7.5 mg via ORAL
  Filled 2011-01-10: qty 1

## 2011-01-10 NOTE — Progress Notes (Signed)
UR Chart Review Completed  

## 2011-01-10 NOTE — Progress Notes (Signed)
ANTICOAGULATION CONSULT NOTE -  Consult  Pharmacy Consult for Heparin and Warfarin Indication: pulmonary embolus  No Known Allergies  Patient Measurements: Height: 5\' 5"  (165.1 cm) Weight: 173 lb 11.2 oz (78.79 kg) IBW/kg (Calculated) : 57  Adjusted Body Weight:77kg  Vital Signs: Temp: 98.4 F (36.9 C) (10/23 0552) Temp src: Oral (10/23 0552) BP: 128/77 mmHg (10/23 0552) Pulse Rate: 73  (10/23 0552)  Labs:  Basename 01/10/11 0601 01/10/11 0124 01/09/11 1936 01/09/11 1710 01/09/11 1019 01/09/11 0630  HGB 10.5* -- -- -- -- 12.2  HCT 32.5* -- -- -- -- 37.5  PLT 129* -- -- -- -- 147*  APTT 127* -- -- -- 35 --  LABPROT 16.6* -- -- -- 14.5 --  INR 1.32 -- -- -- 1.11 --  HEPARINUNFRC 0.38 0.40 -- 0.85* -- --  CREATININE 1.51* -- -- -- -- 1.78*  CKTOTAL -- -- 71 -- -- --  CKMB -- -- 2.3 -- -- --  TROPONINI -- -- <0.30 -- -- --   Estimated Creatinine Clearance: 34.9 ml/min (by C-G formula based on Cr of 1.51).  Medical History: Past Medical History  Diagnosis Date  . History of kidney cancer   . Hypertension   . DVT (deep venous thrombosis)   . Hypercholesteremia   . Osteoporosis     Medications:   Reviewed  Assessment: Initial anticoagulation for PE. Heparin level is therapeutic.Heparin rate decreased to 1100 units per hour on 01-09-11. INR sub-therapeutic. Pltc 122K. Daily CBC's ordered. Goal of Therapy:  INR 2-3 Heparin level 0.3- 0.7    Plan:   Continue Heparin at 1100 units per hour. Warfarin 7.5mg  today. Minimum 5 day overlap with INR over 2.0 for 24 hours, Daily Protime Daily Heparin level. Daily CBC  Lisa Lucas J 01/10/2011,10:08 AM

## 2011-01-10 NOTE — Progress Notes (Signed)
CRITICAL VALUE ALERT  Critical value received:  [place] of legs  Date of notification:  01/10/2011  Time of notification:  1700  Critical value read back:yes  Nurse who received alert:  Christinia Gully, RN  MD notified (1st page):  Dr. Laurie Panda  Time of first page:  1705  MD notified (2nd page):Dr. Laurie Panda  Time of second page:1815  Responding MD:  Dr. Laurie Panda  Time MD responded:  [phone]

## 2011-01-10 NOTE — Progress Notes (Signed)
Subjective: Complains of vomiting twice , no abdominal  Pain ,, has no BM since Sunday, still with shortness of breath with minimal activity , she is currently on 3 liter oxygen.  Objective: Vital signs in last 24 hours: Temp:  [97.9 F (36.6 C)-98.4 F (36.9 C)] 98.4 F (36.9 C) (10/23 0552) Pulse Rate:  [73-77] 73  (10/23 0552) Resp:  [18-22] 18  (10/23 0552) BP: (97-156)/(62-79) 128/77 mmHg (10/23 0552) SpO2:  [90 %-97 %] 90 % (10/23 0552) FiO2 (%):  [2 %] 2 % (10/22 1351) Weight:  [76.885 kg (169 lb 8 oz)-78.79 kg (173 lb 11.2 oz)] 173 lb 11.2 oz (78.79 kg) (10/23 0552) Weight change: 1.814 kg (4 lb) Last BM Date: 01/08/11  Intake/Output from previous day: 10/22 0701 - 10/23 0700 In: 168 [P.O.:80; I.V.:88] Out: 200 [Urine:200] Intake/Output this shift: Total I/O In: 120 [P.O.:120] Out: -   ON EXAM:lying on bed ,not on respiratory distress or active shortness brearh  HEENT: no IVP, NO lymph node  LUNG: some raleson the right lung base    HEART": S1and S2 , no gallop ,no murmur  ABD: soft non tender ,bowel sound present. No rebound  Extremities: no edema. No cyanosis , pulse intact  CNS: awake and fully oriented , no neuro deficit.   Lab Results:  Basename 01/10/11 0601 01/09/11 0630  WBC 5.1 7.3  HGB 10.5* 12.2  HCT 32.5* 37.5  PLT 129* 147*   BMET  Basename 01/10/11 0601 01/09/11 0630  NA 135 136  K 4.6 4.6  CL 102 101  CO2 26 26  GLUCOSE 97 127*  BUN 25* 30*  CREATININE 1.51* 1.78*  CALCIUM 9.2 9.7    Studies/Results: Ct Abdomen Pelvis W Contrast  01/09/2011  *RADIOLOGY REPORT*  Clinical Data: RLQ/right flank pain  CT ABDOMEN AND PELVIS WITH CONTRAST  Technique:  Multidetector CT imaging of the abdomen and pelvis was performed following the standard protocol during bolus administration of intravenous contrast.  Contrast: 51mL OMNIPAQUE IOHEXOL 300 MG/ML IV SOLN  Comparison: None.  Findings: Pulmonary embolus at the bifurcation of the main pulmonary  artery extending to the right lower lobe pulmonary artery (series 2/image 3).  Mild patchy opacity in the right lower lobe.  Contrast within the distal esophagus, possibly reflecting esophageal dysmotility or gastroesophageal reflux.  Liver, pancreas, and adrenal glands are grossly unremarkable.  2.3 x 3.0 cm hypoenhancing lesion in the spleen (series 2/image 27), incompletely characterized, likely benign hemangioma or lymphangioma.  Cholelithiasis (series 2/image 29), without associated inflammatory changes.  1.5 x 2.3 cm cystic lesion minimally complicated by a rim calcification in the anterior left upper pole (series 2/image 41). Additional tiny probable renal cysts and areas of cortical scarring/atrophy.  No renal calculi or hydronephrosis.  No evidence of bowel obstruction.  Appendix is not discretely visualized and may be surgically absent.  Extensive colonic diverticulosis, without associated inflammatory changes.  Atherosclerotic calcifications of the abdominal aorta and branch vessels.  No abdominopelvic ascites.  No suspicious abdominopelvic lymphadenopathy.  Status post hysterectomy.  No adnexal masses.  No ureteral or bladder calculi.  Large left bladder diverticulum.  At least three midline/right paramidline ventral hernias containing fat.  Mild degenerative changes of the visualized thoracolumbar spine.  IMPRESSION:  Pulmonary embolus at the bifurcation of the main pulmonary artery and extending into the right lower lobe pulmonary artery.  Critical Value/emergent results were called by telephone at the time of interpretation on 01/09/2011  at 0900 hours  to  Dr. Broadus John  Zammit, who verbally acknowledged these results.  Original Report Authenticated By: Julian Hy, M.D.    Medications:  Continuous:   . sodium chloride    . heparin 1,100 Units/hr (01/10/11 0456)  . DISCONTD: heparin 18 Units/kg/hr (01/09/11 1032)    Assessment/Plan:  1- pulmonary embolism, has history of DVT in the past  ,not on coumadin secondary to reaction?, will get more information from patient MD, venous doppler pending, echo pending, she has rales on the right lower lung likely secondary to infarct ,w ill get CXR, will continue iv heparin and coumadin. Dr Nevada Crane [id number] 2-thrompocytopenia: keep monitor  3- anemia: stool guaiac, anemia panel 4- vomiting cause not clear will observe , abdomen soft and non tender 5-renal insuffiencey: likely chronic with history of kidney ca sp bilateral surgery . 6 - hypoxia  Secondary to PULMONARY EMBOLISM  LOS: 1 day   Theo Reither I. 01/10/2011, 1:05 PM

## 2011-01-10 NOTE — Plan of Care (Signed)
Problem: Phase II Progression Outcomes Goal: Therapeutic drug levels for anticoagulation Outcome: Progressing Still not therapeutic

## 2011-01-11 ENCOUNTER — Inpatient Hospital Stay (HOSPITAL_COMMUNITY): Payer: Medicare Other

## 2011-01-11 LAB — PROTIME-INR
INR: 1.27 (ref 0.00–1.49)
Prothrombin Time: 16.2 seconds — ABNORMAL HIGH (ref 11.6–15.2)

## 2011-01-11 LAB — COMPREHENSIVE METABOLIC PANEL
AST: 13 U/L (ref 0–37)
BUN: 25 mg/dL — ABNORMAL HIGH (ref 6–23)
CO2: 29 mEq/L (ref 19–32)
Calcium: 9.3 mg/dL (ref 8.4–10.5)
Chloride: 101 mEq/L (ref 96–112)
Creatinine, Ser: 1.61 mg/dL — ABNORMAL HIGH (ref 0.50–1.10)
GFR calc Af Amer: 36 mL/min — ABNORMAL LOW (ref >90)
GFR calc non Af Amer: 31 mL/min — ABNORMAL LOW (ref >90)
Glucose, Bld: 90 mg/dL (ref 70–99)
Total Bilirubin: 0.2 mg/dL — ABNORMAL LOW (ref 0.3–1.2)

## 2011-01-11 LAB — CBC
Hemoglobin: 10.7 g/dL — ABNORMAL LOW (ref 12.0–15.0)
MCH: 30.7 pg (ref 26.0–34.0)
Platelets: 151 10*3/uL (ref 150–400)
RBC: 3.49 MIL/uL — ABNORMAL LOW (ref 3.87–5.11)
WBC: 3.9 10*3/uL — ABNORMAL LOW (ref 4.0–10.5)

## 2011-01-11 LAB — HEPARIN LEVEL (UNFRACTIONATED): Heparin Unfractionated: 0.56 IU/mL (ref 0.30–0.70)

## 2011-01-11 MED ORDER — WARFARIN SODIUM 7.5 MG PO TABS
7.5000 mg | ORAL_TABLET | Freq: Once | ORAL | Status: AC
  Administered 2011-01-11: 7.5 mg via ORAL
  Filled 2011-01-11: qty 1

## 2011-01-11 NOTE — Progress Notes (Signed)
Received consult for diet education for warfarin diet restrictions (pt going home on coumadin). Educated pt on diet principles; discussed high vitamin k food sources and importance of consistency of vitamin high k foods in diet.  Also assured pt that hospital menus are planned to comply with this restriction. Provided "Vitamin K and Medication" handout. Expect good compliance. I appreciate the consult.   Joaquim Lai, RD, LDN Pager: [phone]

## 2011-01-11 NOTE — Progress Notes (Signed)
ANTICOAGULATION CONSULT NOTE   Pharmacy Consult for Heparin and Warfarin Indication: pulmonary embolus  No Known Allergies  Patient Measurements: Height: 5\' 5"  (165.1 cm) Weight: 173 lb 11.2 oz (78.79 kg) IBW/kg (Calculated) : 57  Adjusted Body Weight:77kg  Vital Signs: Temp: 98.6 F (37 C) (10/24 0500) Temp src: Oral (10/24 0500) BP: 90/60 mmHg (10/24 0500) Pulse Rate: 68  (10/24 0500)  Labs:  Basename 01/11/11 0447 01/10/11 0601 01/10/11 0124 01/09/11 1936 01/09/11 1019 01/09/11 0630  HGB 10.7* 10.5* -- -- -- --  HCT 33.3* 32.5* -- -- -- 37.5  PLT 151 129* -- -- -- 147*  APTT -- 127* -- -- 35 --  LABPROT 16.2* 16.6* -- -- 14.5 --  INR 1.27 1.32 -- -- 1.11 --  HEPARINUNFRC 0.56 0.38 0.40 -- -- --  CREATININE 1.61* 1.51* -- -- -- 1.78*  CKTOTAL -- -- -- 71 -- --  CKMB -- -- -- 2.3 -- --  TROPONINI -- -- -- <0.30 -- --   Estimated Creatinine Clearance: 32.8 ml/min (by C-G formula based on Cr of 1.61).  Medical History: Past Medical History  Diagnosis Date  . History of kidney cancer   . Hypertension   . DVT (deep venous thrombosis)   . Hypercholesteremia   . Osteoporosis    Medications: Reviewed  Assessment: Initial anticoagulation for PE. Heparin level is therapeutic INR sub-therapeutic. Platelets improved  Goal of Therapy:  INR 2-3 Heparin level 0.3- 0.7   Plan:   Continue Heparin at 1100 units per hour. Warfarin 7.5mg  today. Minimum 5 day overlap with INR over 2.0 for 24 hours, Daily Protime Daily Heparin level. Daily CBC  Glendon Fiser A 01/11/2011,8:25 AM

## 2011-01-11 NOTE — Progress Notes (Signed)
Chart reviewed.  Subjective: No pain. No nausea. No dyspnea at rest. She does feel dyspneic on exertion.  objective: Vital signs in last 24 hours: Filed Vitals:   01/10/11 0552 01/10/11 1449 01/10/11 2057 01/11/11 0500  BP: 128/77 119/74 150/87 90/60  Pulse: 73 68 73 68  Temp: 98.4 F (36.9 C) 97.9 F (36.6 C) 98.5 F (36.9 C) 98.6 F (37 C)  TempSrc: Oral  Oral Oral  Resp: 18 19 18 20   Height:      Weight: 78.79 kg (173 lb 11.2 oz)     SpO2: 90% 91% 93% 97%   Weight change:   Intake/Output Summary (Last 24 hours) at 01/11/11 1444 Last data filed at 01/11/11 0459  Gross per 24 hour  Intake    372 ml  Output      0 ml  Net    372 ml   General: Comfortable. Lungs clear to auscultation bilaterally without wheeze rhonchi or rales Cardiovascular regular rate rhythm without murmurs gallops rubs Abdomen soft nontender nondistended Extremities no clubbing cyanosis or edema  Lab Results: Basic Metabolic Panel:  Lab [id number] 0447 01/10/11 0601  NA 136 135  K 4.8 4.6  CL 101 102  CO2 29 26  GLUCOSE 90 97  BUN 25* 25*  CREATININE 1.61* 1.51*  CALCIUM 9.3 9.2  MG -- --  PHOS -- --   Liver Function Tests:  Lab 01/11/11 0447 01/10/11 0601  AST 13 14  ALT 5 5  ALKPHOS 79 79  BILITOT 0.2* 0.5  PROT 6.3 6.3  ALBUMIN 2.9* 3.0*    Lab 01/09/11 0732  LIPASE 23  AMYLASE --   No results found for this basename: AMMONIA:2 in the last 168 hours CBC:  Lab 01/11/11 0447 01/10/11 0601 01/09/11 0630  WBC 3.9* 5.1 --  NEUTROABS -- -- 5.8  HGB 10.7* 10.5* --  HCT 33.3* 32.5* --  MCV 95.4 95.0 --  PLT 151 129* --   Cardiac Enzymes:  Lab 01/09/11 1936  CKTOTAL 71  CKMB 2.3  CKMBINDEX --  TROPONINI <0.30   BNP: No results found for this basename: POCBNP:3 in the last 168 hours D-Dimer: No results found for this basename: DDIMER:2 in the last 168 hours CBG: No results found for this basename: GLUCAP:6 in the last 168 hours Hemoglobin A1C: No results found for  this basename: HGBA1C in the last 168 hours Fasting Lipid Panel: No results found for this basename: CHOL,HDL,LDLCALC,TRIG,CHOLHDL,LDLDIRECT in the last 168 hours Thyroid Function Tests: No results found for this basename: TSH,T4TOTAL,FREET4,T3FREE,THYROIDAB in the last 168 hours Anemia Panel: No results found for this basename: VITAMINB12,FOLATE,FERRITIN,TIBC,IRON,RETICCTPCT in the last 168 hours   Micro Results: No results found for this or any previous visit (from the past 240 hour(s)). Studies/Results: Dg Chest 2 View  01/11/2011  *RADIOLOGY REPORT*  Clinical Data: Shortness of breath.  CHEST - 2 VIEW 01/11/2011:  Comparison: None.  Findings: Cardiac silhouette normal in size.  Thoracic aorta tortuous and atherosclerotic.  Hilar and mediastinal contours otherwise unremarkable.  Airspace consolidation in the right middle and lower lobes, silhouetting the right heart border and the the right hemidiaphragm.  Probable small associated right pleural effusion.  Left lung clear.  Pulmonary vascularity normal.  Mild degenerative changes involving the thoracic spine.  IMPRESSION: Pneumonia and/or atelectasis involving the right middle lobe and right lower lobe.  Probable small associated right pleural effusion.  Original Report Authenticated By: Deniece Portela, M.D.   US Venous Img Lower Bilateral  01/10/2011  *  RADIOLOGY REPORT*  Clinical Data: Pulmonary embolism.  VENOUS DUPLEX ULTRASOUND OF BILATERAL LOWER EXTREMITIES  Technique:  Gray-scale sonography with graded compression, as well as color Doppler and duplex ultrasound, were performed to evaluate the deep venous system of both lower extremities from the level of the common femoral vein through the popliteal and proximal calf veins.  Spectral Doppler was utilized to evaluate flow at rest and with distal augmentation maneuvers.  Comparison:  None.  Findings: There is evidence of thrombus in the right popliteal vein.  This thrombus causes  significant luminal narrowing but is not completely occlusive to flow.  Left lower extremity shows no evidence of deep venous thrombosis.  Small focal fluid collection in the left popliteal fossa measures approximately 2.3 x 1.2 x  3.0 cm and is consistent with Chovan's cyst.  IMPRESSION: Nonocclusive DVT in the right popliteal vein.  Small left popliteal fossa Ferrufino's cyst.  Original Report Authenticated By: Azzie Roup, M.D.    Scheduled Meds:   . antiseptic oral rinse   Mouth Rinse BID  . aspirin  325 mg Oral Daily  . famotidine  20 mg Oral BID  . simvastatin  20 mg Oral QHS  . sodium chloride      . warfarin  7.5 mg Oral ONCE-1800  . warfarin  7.5 mg Oral ONCE-1800   Continuous Infusions:   . heparin 1,100 Units/hr (01/11/11 0458)   PRN Meds:.acetaminophen, bisacodyl, famotidine, HYDROmorphone (DILAUDID) injection, ondansetron (ZOFRAN) IV, ondansetron, senna-docusate, DISCONTD: acetaminophen, DISCONTD: HYDROcodone-acetaminophen Assessment/Plan: Principal Problem:  *Pulmonary embolism Active Problems:  History of DVT (deep vein thrombosis)  Renal failure, acute  Vomiting secondary to hydrocodone.  Hyperlipidemia  HTN (hypertension) Acute DVT right popliteal vein.  Vomiting resolved. She thinks it was related to pain medication. Her pain is being controlled with Tylenol. She feels slightly dyspneic on exertion. She's only ambulated to the bathroom. No chest pain. No bleeding. Her daughter reports that she had hives and vomiting with Coumadin previously, neither of which she has had during this hospitalization on Coumadin. I will get records from [facility] check a room air saturation.    LOS: 2 days   Lisa Lucas L 01/11/2011, 2:44 PM

## 2011-01-12 LAB — CBC
MCH: 30.7 pg (ref 26.0–34.0)
MCHC: 32.6 g/dL (ref 30.0–36.0)
RBC: 3.62 MIL/uL — ABNORMAL LOW (ref 3.87–5.11)
WBC: 3.9 10*3/uL — ABNORMAL LOW (ref 4.0–10.5)

## 2011-01-12 LAB — PROTIME-INR: INR: 1.25 (ref 0.00–1.49)

## 2011-01-12 MED ORDER — SODIUM CHLORIDE 0.9 % IJ SOLN
INTRAMUSCULAR | Status: AC
  Administered 2011-01-12: 10 mL
  Filled 2011-01-12: qty 10

## 2011-01-12 MED ORDER — GABAPENTIN 600 MG PO TABS
300.0000 mg | ORAL_TABLET | Freq: Every day | ORAL | Status: DC
  Filled 2011-01-12: qty 0.5

## 2011-01-12 MED ORDER — SODIUM CHLORIDE 0.9 % IJ SOLN
INTRAMUSCULAR | Status: AC
  Administered 2011-01-12: 3 mL
  Filled 2011-01-12: qty 3

## 2011-01-12 MED ORDER — WARFARIN SODIUM 10 MG PO TABS
10.0000 mg | ORAL_TABLET | Freq: Once | ORAL | Status: AC
  Administered 2011-01-12: 10 mg via ORAL
  Filled 2011-01-12: qty 1

## 2011-01-12 MED ORDER — GABAPENTIN 300 MG PO CAPS
300.0000 mg | ORAL_CAPSULE | Freq: Every day | ORAL | Status: DC
  Administered 2011-01-12 – 2011-01-13 (×2): 300 mg via ORAL
  Filled 2011-01-12 (×2): qty 1

## 2011-01-12 NOTE — Progress Notes (Signed)
Subjective: No pain. No nausea. No dyspnea at rest. No rash or nausea. Her daughter has multiple questions, mainly about chronic left leg pain which she has been told is from osteoarthritis. Her daughter has asked me to call her vascular surgeon, Dr. Nicola Girt at [facility]. His telephone number is [phone].  objective: Vital signs in last 24 hours: Filed Vitals:   01/11/11 1611 01/11/11 2132 01/12/11 0639 01/12/11 0656  BP:  131/80 134/74   Pulse:  76 72   Temp:  98.2 F (36.8 C) 98.2 F (36.8 C)   TempSrc:  Oral Oral   Resp:  18 18   Height:      Weight:      SpO2: 93% 94% 94% 94%   Weight change:   Intake/Output Summary (Last 24 hours) at 01/12/11 1453 Last data filed at 01/12/11 1021  Gross per 24 hour  Intake    740 ml  Output      1 ml  Net    739 ml   General: Comfortable. Currently getting a bath. Lungs clear to auscultation bilaterally without wheeze rhonchi or rales Cardiovascular regular rate rhythm without murmurs gallops rubs Abdomen soft nontender nondistended Extremities no clubbing cyanosis or edema  Lab Results: Basic Metabolic Panel:  Lab [id number] 0447 01/10/11 0601  NA 136 135  K 4.8 4.6  CL 101 102  CO2 29 26  GLUCOSE 90 97  BUN 25* 25*  CREATININE 1.61* 1.51*  CALCIUM 9.3 9.2  MG -- --  PHOS -- --   Liver Function Tests:  Lab 01/11/11 0447 01/10/11 0601  AST 13 14  ALT 5 5  ALKPHOS 79 79  BILITOT 0.2* 0.5  PROT 6.3 6.3  ALBUMIN 2.9* 3.0*    Lab 01/09/11 0732  LIPASE 23  AMYLASE --   No results found for this basename: AMMONIA:2 in the last 168 hours CBC:  Lab 01/12/11 0539 01/11/11 0447 01/09/11 0630  WBC 3.9* 3.9* --  NEUTROABS -- -- 5.8  HGB 11.1* 10.7* --  HCT 34.1* 33.3* --  MCV 94.2 95.4 --  PLT 163 151 --   Cardiac Enzymes:  Lab 01/09/11 1936  CKTOTAL 71  CKMB 2.3  CKMBINDEX --  TROPONINI <0.30   BNP: No results found for this basename: POCBNP:3 in the last 168 hours D-Dimer: No results found for  this basename: DDIMER:2 in the last 168 hours CBG: No results found for this basename: GLUCAP:6 in the last 168 hours Hemoglobin A1C: No results found for this basename: HGBA1C in the last 168 hours Fasting Lipid Panel: No results found for this basename: CHOL,HDL,LDLCALC,TRIG,CHOLHDL,LDLDIRECT in the last 168 hours Thyroid Function Tests: No results found for this basename: TSH,T4TOTAL,FREET4,T3FREE,THYROIDAB in the last 168 hours Anemia Panel: No results found for this basename: VITAMINB12,FOLATE,FERRITIN,TIBC,IRON,RETICCTPCT in the last 168 hours   Micro Results: No results found for this or any previous visit (from the past 240 hour(s)). Studies/Results: Dg Chest 2 View  01/11/2011  *RADIOLOGY REPORT*  Clinical Data: Shortness of breath.  CHEST - 2 VIEW 01/11/2011:  Comparison: None.  Findings: Cardiac silhouette normal in size.  Thoracic aorta tortuous and atherosclerotic.  Hilar and mediastinal contours otherwise unremarkable.  Airspace consolidation in the right middle and lower lobes, silhouetting the right heart border and the the right hemidiaphragm.  Probable small associated right pleural effusion.  Left lung clear.  Pulmonary vascularity normal.  Mild degenerative changes involving the thoracic spine.  IMPRESSION: Pneumonia and/or atelectasis involving the right middle lobe  and right lower lobe.  Probable small associated right pleural effusion.  Original Report Authenticated By: Deniece Portela, M.D.   US Venous Img Lower Bilateral  01/10/2011  *RADIOLOGY REPORT*  Clinical Data: Pulmonary embolism.  VENOUS DUPLEX ULTRASOUND OF BILATERAL LOWER EXTREMITIES  Technique:  Gray-scale sonography with graded compression, as well as color Doppler and duplex ultrasound, were performed to evaluate the deep venous system of both lower extremities from the level of the common femoral vein through the popliteal and proximal calf veins.  Spectral Doppler was utilized to evaluate flow at rest  and with distal augmentation maneuvers.  Comparison:  None.  Findings: There is evidence of thrombus in the right popliteal vein.  This thrombus causes significant luminal narrowing but is not completely occlusive to flow.  Left lower extremity shows no evidence of deep venous thrombosis.  Small focal fluid collection in the left popliteal fossa measures approximately 2.3 x 1.2 x  3.0 cm and is consistent with Burmester's cyst.  IMPRESSION: Nonocclusive DVT in the right popliteal vein.  Small left popliteal fossa Devereux's cyst.  Original Report Authenticated By: Azzie Roup, M.D.    Scheduled Meds:    . antiseptic oral rinse   Mouth Rinse BID  . famotidine  20 mg Oral BID  . simvastatin  20 mg Oral QHS  . sodium chloride      . sodium chloride      . warfarin  10 mg Oral ONCE-1800  . warfarin  7.5 mg Oral ONCE-1800   Continuous Infusions:    . heparin 1,300 Units/hr (01/12/11 0810)   PRN Meds:.acetaminophen, bisacodyl, famotidine, HYDROmorphone (DILAUDID) injection, ondansetron (ZOFRAN) IV, ondansetron, senna-docusate Assessment/Plan: Principal Problem:  *Pulmonary embolism Active Problems:  History of DVT (deep vein thrombosis)  Renal failure, acute  Vomiting secondary to hydrocodone.  Hyperlipidemia  HTN (hypertension) Acute DVT right popliteal vein.  Patient has had no adverse effects from Coumadin. I am still awaiting records from [facility]. I have left a message with her surgeon, Dr. Nicola Girt he and await a call back. Continue current for now. Her left leg pain is chronic but her daughter has many questions about it. She reports that it happened after some type of vascular procedure. I will speak with Dr. Nicola Girt about it and return to examine the patient further once she is done bathing.   LOS: 3 days   Rosalie Buenaventura L 01/12/2011, 2:53 PM

## 2011-01-12 NOTE — Plan of Care (Signed)
Problem: Phase II Progression Outcomes Goal: Therapeutic drug levels for anticoagulation Heparin levels therapeutic but not coumadin levels

## 2011-01-12 NOTE — Progress Notes (Signed)
ANTICOAGULATION CONSULT NOTE   Pharmacy Consult for Heparin and Warfarin Indication: pulmonary embolus  No Known Allergies  Patient Measurements: Height: 5\' 5"  (165.1 cm) Weight: 173 lb 11.2 oz (78.79 kg) IBW/kg (Calculated) : 57  Adjusted Body Weight:77kg  Vital Signs: Temp: 98.2 F (36.8 C) (10/25 0639) Temp src: Oral (10/25 0639) BP: 134/74 mmHg (10/25 0639) Pulse Rate: 72  (10/25 0639)  Labs:  Basename 01/12/11 0539 01/11/11 0447 01/10/11 0601 01/09/11 1936 01/09/11 1019  HGB 11.1* 10.7* -- -- --  HCT 34.1* 33.3* 32.5* -- --  PLT 163 151 129* -- --  APTT -- -- 127* -- 35  LABPROT 16.0* 16.2* 16.6* -- --  INR 1.25 1.27 1.32 -- --  HEPARINUNFRC 0.25* 0.56 0.38 -- --  CREATININE -- 1.61* 1.51* -- --  CKTOTAL -- -- -- 71 --  CKMB -- -- -- 2.3 --  TROPONINI -- -- -- <0.30 --   Estimated Creatinine Clearance: 32.8 ml/min (by C-G formula based on Cr of 1.61).  Medical History: Past Medical History  Diagnosis Date  . History of kidney cancer   . Hypertension   . DVT (deep venous thrombosis)   . Hypercholesteremia   . Osteoporosis    Medications: Reviewed  Assessment: Initial anticoagulation for PE. Heparin level is below goal today INR sub-therapeutic. Platelets improved  Goal of Therapy:  INR 2-3 Heparin level 0.3- 0.7   Plan: Increase heparin to 1300 units/hr Re-check heparin level in 6 hours Consider switching to full dose Lovenox and stop heparin Coumadin 10mg  today at 6pm Minimum 5 day overlap coumadin & heparin/Lovenox with INR over 2.0 for 24 hours Daily Protime Daily Heparin level.   [place] Crane, Rhea Kaelin A 01/12/2011,8:12 AM

## 2011-01-13 LAB — PROTIME-INR
INR: 1.69 — ABNORMAL HIGH (ref 0.00–1.49)
Prothrombin Time: 20.2 seconds — ABNORMAL HIGH (ref 11.6–15.2)

## 2011-01-13 LAB — CBC
HCT: 34.7 % — ABNORMAL LOW (ref 36.0–46.0)
RDW: 13.9 % (ref 11.5–15.5)
WBC: 4.3 10*3/uL (ref 4.0–10.5)

## 2011-01-13 LAB — HEPARIN LEVEL (UNFRACTIONATED): Heparin Unfractionated: 0.55 IU/mL (ref 0.30–0.70)

## 2011-01-13 MED ORDER — WARFARIN SODIUM 5 MG PO TABS: 5.0000 mg | ORAL_TABLET | Freq: Once | ORAL | Status: DC

## 2011-01-13 MED ORDER — WARFARIN SODIUM 7.5 MG PO TABS
7.5000 mg | ORAL_TABLET | Freq: Once | ORAL | Status: AC
  Administered 2011-01-13: 7.5 mg via ORAL
  Filled 2011-01-13: qty 1

## 2011-01-13 NOTE — Progress Notes (Signed)
Subjective: Has ambulated the halls without difficulty. Started on gabapentin last night for neuropathic pain/sciatica of her left leg. Her pain radiates down her left buttock to her leg. She has tried multiple medications in the past without much improvement in this chronic pain. No bleeding. No rash. No vomiting.  objective: Vital signs in last 24 hours: Filed Vitals:   01/12/11 1430 01/13/11 0700 01/13/11 1500 01/13/11 1611  BP: 143/86 121/68 124/84   Pulse: 82 80 82 83  Temp: 97.6 F (36.4 C) 97.7 F (36.5 C) 98.1 F (36.7 C)   TempSrc: Oral     Resp: 20 20 16    Height:      Weight:      SpO2: 94% 97% 93% 97%   Weight change:   Intake/Output Summary (Last 24 hours) at 01/13/11 1614 Last data filed at 01/13/11 1200  Gross per 24 hour  Intake    840 ml  Output      0 ml  Net    840 ml   General: Was ambulating in the hallway earlier today. Lungs clear to auscultation bilaterally without wheeze rhonchi or rales Cardiovascular regular rate rhythm without murmurs gallops rubs Abdomen soft nontender nondistended Extremities no clubbing cyanosis or edema Musculoskeletal: No joint effusions erythema or tenderness. Straight leg raise positive on the left leg  Lab Results: Basic Metabolic Panel:  Lab [id number] 0447 01/10/11 0601  NA 136 135  K 4.8 4.6  CL 101 102  CO2 29 26  GLUCOSE 90 97  BUN 25* 25*  CREATININE 1.61* 1.51*  CALCIUM 9.3 9.2  MG -- --  PHOS -- --   Liver Function Tests:  Lab 01/11/11 0447 01/10/11 0601  AST 13 14  ALT 5 5  ALKPHOS 79 79  BILITOT 0.2* 0.5  PROT 6.3 6.3  ALBUMIN 2.9* 3.0*    Lab 01/09/11 0732  LIPASE 23  AMYLASE --   No results found for this basename: AMMONIA:2 in the last 168 hours CBC:  Lab 01/13/11 0516 01/12/11 0539 01/09/11 0630  WBC 4.3 3.9* --  NEUTROABS -- -- 5.8  HGB 11.3* 11.1* --  HCT 34.7* 34.1* --  MCV 94.0 94.2 --  PLT 169 163 --   Cardiac Enzymes:  Lab 01/09/11 1936  CKTOTAL 71  CKMB 2.3    CKMBINDEX --  TROPONINI <0.30   BNP: No results found for this basename: POCBNP:3 in the last 168 hours D-Dimer: No results found for this basename: DDIMER:2 in the last 168 hours CBG: No results found for this basename: GLUCAP:6 in the last 168 hours Hemoglobin A1C: No results found for this basename: HGBA1C in the last 168 hours Fasting Lipid Panel: No results found for this basename: CHOL,HDL,LDLCALC,TRIG,CHOLHDL,LDLDIRECT in the last 168 hours Thyroid Function Tests: No results found for this basename: TSH,T4TOTAL,FREET4,T3FREE,THYROIDAB in the last 168 hours Anemia Panel: No results found for this basename: VITAMINB12,FOLATE,FERRITIN,TIBC,IRON,RETICCTPCT in the last 168 hours   Micro Results: No results found for this or any previous visit (from the past 240 hour(s)). Studies/Results: No results found.  Scheduled Meds:    . antiseptic oral rinse   Mouth Rinse BID  . famotidine  20 mg Oral BID  . gabapentin  300 mg Oral QHS  . simvastatin  20 mg Oral QHS  . warfarin  10 mg Oral ONCE-1800  . warfarin  7.5 mg Oral ONCE-1800  . DISCONTD: gabapentin  300 mg Oral QHS  . DISCONTD: warfarin  5 mg Oral ONCE-1800   Continuous  Infusions:    . heparin 1,300 Units/hr (01/13/11 0306)   PRN Meds:.acetaminophen, bisacodyl, famotidine, HYDROmorphone (DILAUDID) injection, ondansetron (ZOFRAN) IV, ondansetron, senna-docusate Assessment/Plan: Principal Problem:  *Pulmonary embolism Active Problems:  History of DVT (deep vein thrombosis)  Renal failure, acute  Vomiting secondary to hydrocodone.  Hyperlipidemia  HTN (hypertension) Acute DVT right popliteal vein. Sciatica  Continue current. Will give 7-1/2 mg of Coumadin today. Hopefully she will be therapeutic tomorrow.   LOS: 4 days   Tilden Broz L 01/13/2011, 4:14 PM

## 2011-01-14 MED ORDER — WARFARIN SODIUM 1 MG PO TABS: 3.0000 mg | ORAL_TABLET | Freq: Once | ORAL | Status: DC

## 2011-01-14 MED ORDER — WARFARIN SODIUM 3 MG PO TABS: ORAL_TABLET | ORAL | Status: DC

## 2011-01-14 MED ORDER — ACETAMINOPHEN 325 MG PO TABS: 650.0000 mg | ORAL_TABLET | Freq: Four times a day (QID) | ORAL | Status: AC | PRN

## 2011-01-14 MED ORDER — GABAPENTIN 300 MG PO CAPS: ORAL_CAPSULE | ORAL | Status: DC

## 2011-01-14 MED ORDER — COUMADIN BOOK
Freq: Once | Status: DC
  Administered 2011-01-14: 12:00:00
  Filled 2011-01-14: qty 1

## 2011-01-14 MED ORDER — WARFARIN SODIUM 5 MG PO TABS: ORAL_TABLET | ORAL | Status: DC

## 2011-01-14 MED ORDER — WARFARIN SODIUM 1 MG PO TABS
3.0000 mg | ORAL_TABLET | Freq: Once | ORAL | Status: AC
  Administered 2011-01-14: 3 mg via ORAL
  Filled 2011-01-14: qty 3

## 2011-01-14 NOTE — Discharge Summary (Signed)
Physician Discharge Summary  Patient ID: DILAN WALDMANN MRN: [id number] DOB/AGE: 73-Oct-1937 73 y.o.  Admit date: 01/09/2011 Discharge date: 01/14/2011  Discharge Diagnoses:  Principal Problem:  *Pulmonary embolism Active Problems:  History of DVT (deep vein thrombosis)  Renal failure, acute  Vomiting  Hyperlipidemia  HTN (hypertension)  sciatica  Current Discharge Medication List    START taking these medications   Details  acetaminophen (TYLENOL) 325 MG tablet Take 2 tablets (650 mg total) by mouth every 6 (six) hours as needed (or Fever >/= 101). Qty: 30 tablet    gabapentin (NEURONTIN) 300 MG capsule 1 capsule by mouth twice daily for a week then 3 times daily Qty: 80 capsule, Refills: 0    warfarin (COUMADIN) 5 MG tablet Alternate one whole tablet/one half tablet nightly or as directed Qty: 20 tablet, Refills: 0      CONTINUE these medications which have NOT CHANGED   Details  Calcium Carbonate-Vitamin D (CALCIUM 600 + D PO) Take 1 tablet by mouth daily.      Calcium-Magnesium-Zinc 1000-400-15 MG TABS Take 1 tablet by mouth daily.      famotidine (PEPCID) 20 MG tablet Take 20 mg by mouth 2 (two) times daily as needed. gas     lisinopril-hydrochlorothiazide (PRINZIDE,ZESTORETIC) 20-12.5 MG per tablet Take 1 tablet by mouth daily.      OVER THE COUNTER MEDICATION Apply 1 patch topically daily as needed. OTC: FAMILY CARE PAIN RELIEF PATCH: for pain     simvastatin (ZOCOR) 20 MG tablet Take 20 mg by mouth at bedtime.      valACYclovir (VALTREX) 1000 MG tablet Take 1 tablet (1,000 mg total) by mouth 3 (three) times daily. Qty: 21 tablet, Refills: 0      STOP taking these medications     aspirin 325 MG EC tablet         Discharge Orders    Future Orders Please Complete By Expires   Activity as tolerated - No restrictions      Discharge instructions      Comments:   Warfarin compatible diet      Follow-up Information    Follow up with [facility]  home care [phone].      Make an appointment with HALL,ZACK. (Monday or Tuesday to check coumadin)    Contact information:   [address] [place] [phone]          Disposition: Home or Self Care  Discharged Condition: Stable  Consults:   none  Labs:   Results for orders placed during the hospital encounter of 01/09/11 (from the past 48 hour(s))  HEPARIN LEVEL     Status: Normal   Collection Time   01/12/11  3:14 PM      Component Value Range Comment   Heparin Unfractionated 0.52  0.30 - 0.70 (IU/mL)   HEPARIN LEVEL     Status: Normal   Collection Time   01/13/11  5:16 AM      Component Value Range Comment   Heparin Unfractionated 0.55  0.30 - 0.70 (IU/mL)   CBC     Status: Abnormal   Collection Time   01/13/11  5:16 AM      Component Value Range Comment   WBC 4.3  4.0 - 10.5 (K/uL)    RBC 3.69 (*) 3.87 - 5.11 (MIL/uL)    Hemoglobin 11.3 (*) 12.0 - 15.0 (g/dL)    HCT 34.7 (*) 36.0 - 46.0 (%)    MCV 94.0  78.0 -  100.0 (fL)    MCH 30.6  26.0 - 34.0 (pg)    MCHC 32.6  30.0 - 36.0 (g/dL)    RDW 13.9  11.5 - 15.5 (%)    Platelets 169  150 - 400 (K/uL)   PROTIME-INR     Status: Abnormal   Collection Time   01/13/11  5:16 AM      Component Value Range Comment   Prothrombin Time 20.2 (*) 11.6 - 15.2 (seconds)    INR 1.69 (*) 0.00 - 1.49    HEPARIN LEVEL     Status: Normal   Collection Time   01/14/11  6:37 AM      Component Value Range Comment   Heparin Unfractionated 0.63  0.30 - 0.70 (IU/mL)   PROTIME-INR     Status: Abnormal   Collection Time   01/14/11  8:15 AM      Component Value Range Comment   Prothrombin Time 29.4 (*) 11.6 - 15.2 (seconds)    INR 2.73 (*) 0.00 - 1.49      Diagnostics:  Dg Chest 2 View  01/11/2011  *RADIOLOGY REPORT*  Clinical Data: Shortness of breath.  CHEST - 2 VIEW 01/11/2011:  Comparison: None.  Findings: Cardiac silhouette normal in size.  Thoracic aorta tortuous and atherosclerotic.  Hilar and  mediastinal contours otherwise unremarkable.  Airspace consolidation in the right middle and lower lobes, silhouetting the right heart border and the the right hemidiaphragm.  Probable small associated right pleural effusion.  Left lung clear.  Pulmonary vascularity normal.  Mild degenerative changes involving the thoracic spine.  IMPRESSION: Pneumonia and/or atelectasis involving the right middle lobe and right lower lobe.  Probable small associated right pleural effusion.  Original Report Authenticated By: Deniece Portela, M.D.   Ct Abdomen Pelvis W Contrast  01/09/2011  *RADIOLOGY REPORT*  Clinical Data: RLQ/right flank pain  CT ABDOMEN AND PELVIS WITH CONTRAST  Technique:  Multidetector CT imaging of the abdomen and pelvis was performed following the standard protocol during bolus administration of intravenous contrast.  Contrast: 40mL OMNIPAQUE IOHEXOL 300 MG/ML IV SOLN  Comparison: None.  Findings: Pulmonary embolus at the bifurcation of the main pulmonary artery extending to the right lower lobe pulmonary artery (series 2/image 3).  Mild patchy opacity in the right lower lobe.  Contrast within the distal esophagus, possibly reflecting esophageal dysmotility or gastroesophageal reflux.  Liver, pancreas, and adrenal glands are grossly unremarkable.  2.3 x 3.0 cm hypoenhancing lesion in the spleen (series 2/image 27), incompletely characterized, likely benign hemangioma or lymphangioma.  Cholelithiasis (series 2/image 29), without associated inflammatory changes.  1.5 x 2.3 cm cystic lesion minimally complicated by a rim calcification in the anterior left upper pole (series 2/image 41). Additional tiny probable renal cysts and areas of cortical scarring/atrophy.  No renal calculi or hydronephrosis.  No evidence of bowel obstruction.  Appendix is not discretely visualized and may be surgically absent.  Extensive colonic diverticulosis, without associated inflammatory changes.  Atherosclerotic  calcifications of the abdominal aorta and branch vessels.  No abdominopelvic ascites.  No suspicious abdominopelvic lymphadenopathy.  Status post hysterectomy.  No adnexal masses.  No ureteral or bladder calculi.  Large left bladder diverticulum.  At least three midline/right paramidline ventral hernias containing fat.  Mild degenerative changes of the visualized thoracolumbar spine.  IMPRESSION:  Pulmonary embolus at the bifurcation of the main pulmonary artery and extending into the right lower lobe pulmonary artery.  Critical Value/emergent results were called by telephone at the time of interpretation  on 01/09/2011  at 0900 hours  to  Dr. Milton Ferguson, who verbally acknowledged these results.  Original Report Authenticated By: Julian Hy, M.D.   US Venous Img Lower Bilateral  01/10/2011  *RADIOLOGY REPORT*  Clinical Data: Pulmonary embolism.  VENOUS DUPLEX ULTRASOUND OF BILATERAL LOWER EXTREMITIES  Technique:  Gray-scale sonography with graded compression, as well as color Doppler and duplex ultrasound, were performed to evaluate the deep venous system of both lower extremities from the level of the common femoral vein through the popliteal and proximal calf veins.  Spectral Doppler was utilized to evaluate flow at rest and with distal augmentation maneuvers.  Comparison:  None.  Findings: There is evidence of thrombus in the right popliteal vein.  This thrombus causes significant luminal narrowing but is not completely occlusive to flow.  Left lower extremity shows no evidence of deep venous thrombosis.  Small focal fluid collection in the left popliteal fossa measures approximately 2.3 x 1.2 x  3.0 cm and is consistent with Klare's cyst.  IMPRESSION: Nonocclusive DVT in the right popliteal vein.  Small left popliteal fossa Winberry's cyst.  Original Report Authenticated By: Azzie Roup, M.D.   EKG: Normal sinus rhythm  Echo:  Left ventricle: The cavity size was normal. There was mild to  moderate concentric hypertrophy. Systolic function was vigorous. The estimated ejection fraction was in the range of 65% to 70%. Wall motion was normal; there were no regional wall motion abnormalities. - Aortic valve: Mildly calcified annulus. - Right ventricle: The cavity size was normal. Wall thickness was mildly increased.  Hospital Course: see H&P for complete admission details. The patient is a 75 year old black female with history of kidney cancer who presented with bright back pain and dyspnea. She had minimal cough. No fevers. She had normal lung sounds, no respiratory distress, normal vital signs. CT scan showed pulmonary embolus at the bifurcation of the main pulmonary artery extending to the right lower lobe pulmonary artery with surrounding patchy opacity. There was some sketchy history of a Coumadin reaction. Her primary care physician and noted no previous history of Coumadin allergy. Patient was started on IV heparin and Coumadin and monitored closely. She had no rash, no bleedin of g or adverse effects. At the time of discharge, she is therapeutic. Her pain and dyspnea has improved. She has remained in normal sinus rhythm. Echocardiogram was unremarkable.  She had complaints of chronic left leg pain. Dopplers of the legs showed right leg DVT but none in the left. Upon further examination she did have a positive straight leg raise, and reported pain radiating down the left buttock to the leg. Her daughter reports that this has been very troublesome for her and she has not responded to Tylenol or other medications. She was started on gabapentin for neuropathic pain/sciatica and this can be titrated upward as tolerated. Her other medical problems remained stable during hospitalization. I received records from [facility] but there is no mention of any Coumadin intolerance or allergy. I also left a message with her vascular surgeon but never got a call back. Total time discharge greater  than 30 minutes.  Discharge Exam: Blood pressure 118/78, pulse 70, temperature 97.7 F (36.5 C), temperature source Oral, resp. rate 18, height 5\' 5"  (1.651 m), weight 78.79 kg (173 lb 11.2 oz), SpO2 94.00%.  Exam unchanged from 01/13/2011   Signed: Pajonal L 01/14/2011, 11:13 AM

## 2011-01-14 NOTE — Progress Notes (Signed)
ANTICOAGULATION CONSULT NOTE   Pharmacy Consult for Heparin and Warfarin Indication: pulmonary embolus  No Known Allergies  Patient Measurements: Height: 5\' 5"  (165.1 cm) Weight: 173 lb 11.2 oz (78.79 kg) IBW/kg (Calculated) : 57  Adjusted Body Weight:77kg  Vital Signs: Temp: 97.7 F (36.5 C) (10/27 0703) Temp src: Oral (10/27 0703) BP: 118/78 mmHg (10/27 0703) Pulse Rate: 70  (10/27 0703)  Labs:  Lisa Lucas 01/14/11 0815 01/14/11 [id number] 01/13/11 0516 01/12/11 1514 01/12/11 0539  HGB -- -- 11.3* -- 11.1*  HCT -- -- 34.7* -- 34.1*  PLT -- -- 169 -- 163  APTT -- -- -- -- --  LABPROT 29.4* -- 20.2* -- 16.0*  INR 2.73* -- 1.69* -- 1.25  HEPARINUNFRC -- 0.63 0.55 0.52 --  CREATININE -- -- -- -- --  CKTOTAL -- -- -- -- --  CKMB -- -- -- -- --  TROPONINI -- -- -- -- --   Estimated Creatinine Clearance: 32.8 ml/min (by C-G formula based on Cr of 1.61).  Medical History: Past Medical History  Diagnosis Date  . History of kidney cancer   . Hypertension   . DVT (deep venous thrombosis)   . Hypercholesteremia   . Osteoporosis    Medications: Reviewed  Assessment: PE. Heparin level is at goal today INR therapeutic for first time today.  Today is day 6 overlap, but INR therapeutic for less than 24 hours. Large increase in INR (> 1.0) No medication interaction with Warfarin observed. No bleeding reported. Platelets improved  Goal of Therapy:  INR 2-3 Heparin level 0.3- 0.7   Plan: Continue heparin at 1300 units/hr Consider switching to full dose Lovenox and stop heparin. Coumadin 3mg  today at 6 pm (lower dose due to large jump in INR) Minimum 5 day overlap coumadin & heparin/Lovenox with INR over 2.0 for 24 hours (met overlap, not 24 hours) Daily Protime Daily Heparin level.   Lisa Lucas, [place] J 01/14/2011,9:13 AM

## 2011-01-14 NOTE — Progress Notes (Signed)
Pt discharged home today per Dr. Conley Canal. Pt's IV site D/C'd and WNL. Pt's VS stable at this time. Pt provided with home medication list, discharge instructions, coumadin booklet and prescriptions. Pt verbalized understanding. Pt left floor via WC accompanied by daughter and NT in stable condition. Sharyn Blitz, RN

## 2011-03-24 DIAGNOSIS — Z7901 Long term (current) use of anticoagulants: Secondary | ICD-10-CM | POA: Diagnosis not present

## 2011-03-24 DIAGNOSIS — T81718A Complication of other artery following a procedure, not elsewhere classified, initial encounter: Secondary | ICD-10-CM | POA: Diagnosis not present

## 2011-03-24 DIAGNOSIS — I2699 Other pulmonary embolism without acute cor pulmonale: Secondary | ICD-10-CM | POA: Diagnosis not present

## 2011-04-07 DIAGNOSIS — T81718A Complication of other artery following a procedure, not elsewhere classified, initial encounter: Secondary | ICD-10-CM | POA: Diagnosis not present

## 2011-04-07 DIAGNOSIS — Z7901 Long term (current) use of anticoagulants: Secondary | ICD-10-CM | POA: Diagnosis not present

## 2011-04-07 DIAGNOSIS — I2699 Other pulmonary embolism without acute cor pulmonale: Secondary | ICD-10-CM | POA: Diagnosis not present

## 2011-04-26 DIAGNOSIS — Z79899 Other long term (current) drug therapy: Secondary | ICD-10-CM | POA: Diagnosis not present

## 2011-04-26 DIAGNOSIS — Z7901 Long term (current) use of anticoagulants: Secondary | ICD-10-CM | POA: Diagnosis not present

## 2011-04-26 DIAGNOSIS — Z885 Allergy status to narcotic agent status: Secondary | ICD-10-CM | POA: Diagnosis not present

## 2011-04-26 DIAGNOSIS — M79609 Pain in unspecified limb: Secondary | ICD-10-CM | POA: Diagnosis not present

## 2011-04-26 DIAGNOSIS — Z86718 Personal history of other venous thrombosis and embolism: Secondary | ICD-10-CM | POA: Diagnosis not present

## 2011-04-26 DIAGNOSIS — F17200 Nicotine dependence, unspecified, uncomplicated: Secondary | ICD-10-CM | POA: Diagnosis not present

## 2011-04-26 DIAGNOSIS — I70209 Unspecified atherosclerosis of native arteries of extremities, unspecified extremity: Secondary | ICD-10-CM | POA: Diagnosis not present

## 2011-04-26 DIAGNOSIS — I2699 Other pulmonary embolism without acute cor pulmonale: Secondary | ICD-10-CM | POA: Diagnosis not present

## 2011-04-26 DIAGNOSIS — I739 Peripheral vascular disease, unspecified: Secondary | ICD-10-CM | POA: Diagnosis not present

## 2011-04-26 DIAGNOSIS — Z9889 Other specified postprocedural states: Secondary | ICD-10-CM | POA: Diagnosis not present

## 2011-04-26 DIAGNOSIS — R5381 Other malaise: Secondary | ICD-10-CM | POA: Diagnosis not present

## 2011-04-26 DIAGNOSIS — E785 Hyperlipidemia, unspecified: Secondary | ICD-10-CM | POA: Diagnosis not present

## 2011-04-26 DIAGNOSIS — I10 Essential (primary) hypertension: Secondary | ICD-10-CM | POA: Diagnosis not present

## 2011-05-09 DIAGNOSIS — I2699 Other pulmonary embolism without acute cor pulmonale: Secondary | ICD-10-CM | POA: Diagnosis not present

## 2011-05-09 DIAGNOSIS — Z7901 Long term (current) use of anticoagulants: Secondary | ICD-10-CM | POA: Diagnosis not present

## 2011-05-29 ENCOUNTER — Other Ambulatory Visit: Payer: Self-pay | Admitting: Internal Medicine

## 2011-06-07 DIAGNOSIS — I10 Essential (primary) hypertension: Secondary | ICD-10-CM | POA: Diagnosis not present

## 2011-06-07 DIAGNOSIS — Z86711 Personal history of pulmonary embolism: Secondary | ICD-10-CM | POA: Diagnosis not present

## 2011-06-07 DIAGNOSIS — Z7901 Long term (current) use of anticoagulants: Secondary | ICD-10-CM | POA: Diagnosis not present

## 2011-06-07 DIAGNOSIS — I4891 Unspecified atrial fibrillation: Secondary | ICD-10-CM | POA: Diagnosis not present

## 2011-06-28 ENCOUNTER — Ambulatory Visit (HOSPITAL_COMMUNITY)
Admission: RE | Admit: 2011-06-28 | Discharge: 2011-06-28 | Disposition: A | Discharge status: Home / Self Care | Payer: Medicare Other | Source: Ambulatory Visit | Attending: Internal Medicine | Admitting: Internal Medicine

## 2011-06-28 ENCOUNTER — Other Ambulatory Visit (HOSPITAL_COMMUNITY): Payer: Self-pay | Admitting: Internal Medicine

## 2011-06-28 DIAGNOSIS — M25559 Pain in unspecified hip: Secondary | ICD-10-CM | POA: Diagnosis not present

## 2011-06-28 DIAGNOSIS — M169 Osteoarthritis of hip, unspecified: Secondary | ICD-10-CM | POA: Diagnosis not present

## 2011-06-28 DIAGNOSIS — M25859 Other specified joint disorders, unspecified hip: Secondary | ICD-10-CM | POA: Diagnosis not present

## 2011-06-28 DIAGNOSIS — M25551 Pain in right hip: Secondary | ICD-10-CM

## 2011-06-29 DIAGNOSIS — Z86711 Personal history of pulmonary embolism: Secondary | ICD-10-CM | POA: Diagnosis not present

## 2011-06-29 DIAGNOSIS — M79609 Pain in unspecified limb: Secondary | ICD-10-CM | POA: Diagnosis not present

## 2011-06-29 DIAGNOSIS — Z7901 Long term (current) use of anticoagulants: Secondary | ICD-10-CM | POA: Diagnosis not present

## 2011-07-26 DIAGNOSIS — T81718A Complication of other artery following a procedure, not elsewhere classified, initial encounter: Secondary | ICD-10-CM | POA: Diagnosis not present

## 2011-07-26 DIAGNOSIS — I2699 Other pulmonary embolism without acute cor pulmonale: Secondary | ICD-10-CM | POA: Diagnosis not present

## 2011-07-26 DIAGNOSIS — Z7901 Long term (current) use of anticoagulants: Secondary | ICD-10-CM | POA: Diagnosis not present

## 2011-07-26 DIAGNOSIS — G4700 Insomnia, unspecified: Secondary | ICD-10-CM | POA: Diagnosis not present

## 2011-08-23 DIAGNOSIS — N289 Disorder of kidney and ureter, unspecified: Secondary | ICD-10-CM | POA: Diagnosis not present

## 2011-08-23 DIAGNOSIS — I10 Essential (primary) hypertension: Secondary | ICD-10-CM | POA: Diagnosis not present

## 2011-08-23 DIAGNOSIS — E785 Hyperlipidemia, unspecified: Secondary | ICD-10-CM | POA: Diagnosis not present

## 2011-08-23 DIAGNOSIS — D649 Anemia, unspecified: Secondary | ICD-10-CM | POA: Diagnosis not present

## 2011-08-23 DIAGNOSIS — Z23 Encounter for immunization: Secondary | ICD-10-CM | POA: Diagnosis not present

## 2011-09-14 DIAGNOSIS — M25569 Pain in unspecified knee: Secondary | ICD-10-CM | POA: Diagnosis not present

## 2011-09-15 DIAGNOSIS — M25569 Pain in unspecified knee: Secondary | ICD-10-CM | POA: Diagnosis not present

## 2011-09-20 DIAGNOSIS — Z7901 Long term (current) use of anticoagulants: Secondary | ICD-10-CM | POA: Diagnosis not present

## 2011-09-20 DIAGNOSIS — I2699 Other pulmonary embolism without acute cor pulmonale: Secondary | ICD-10-CM | POA: Diagnosis not present

## 2011-09-20 DIAGNOSIS — I10 Essential (primary) hypertension: Secondary | ICD-10-CM | POA: Diagnosis not present

## 2011-09-26 DIAGNOSIS — IMO0002 Reserved for concepts with insufficient information to code with codable children: Secondary | ICD-10-CM | POA: Diagnosis not present

## 2011-10-04 DIAGNOSIS — I2699 Other pulmonary embolism without acute cor pulmonale: Secondary | ICD-10-CM | POA: Diagnosis not present

## 2011-10-04 DIAGNOSIS — T81718A Complication of other artery following a procedure, not elsewhere classified, initial encounter: Secondary | ICD-10-CM | POA: Diagnosis not present

## 2011-10-04 DIAGNOSIS — I10 Essential (primary) hypertension: Secondary | ICD-10-CM | POA: Diagnosis not present

## 2011-10-04 DIAGNOSIS — K921 Melena: Secondary | ICD-10-CM | POA: Diagnosis not present

## 2011-10-04 DIAGNOSIS — Z7901 Long term (current) use of anticoagulants: Secondary | ICD-10-CM | POA: Diagnosis not present

## 2011-11-02 DIAGNOSIS — K921 Melena: Secondary | ICD-10-CM | POA: Diagnosis not present

## 2011-11-02 DIAGNOSIS — I2699 Other pulmonary embolism without acute cor pulmonale: Secondary | ICD-10-CM | POA: Diagnosis not present

## 2011-11-02 DIAGNOSIS — Z7901 Long term (current) use of anticoagulants: Secondary | ICD-10-CM | POA: Diagnosis not present

## 2011-11-02 DIAGNOSIS — T81718A Complication of other artery following a procedure, not elsewhere classified, initial encounter: Secondary | ICD-10-CM | POA: Diagnosis not present

## 2011-11-30 ENCOUNTER — Telehealth: Payer: Self-pay

## 2011-11-30 ENCOUNTER — Other Ambulatory Visit (HOSPITAL_COMMUNITY): Payer: Self-pay | Admitting: Internal Medicine

## 2011-11-30 DIAGNOSIS — Z7901 Long term (current) use of anticoagulants: Secondary | ICD-10-CM | POA: Diagnosis not present

## 2011-11-30 DIAGNOSIS — T81718A Complication of other artery following a procedure, not elsewhere classified, initial encounter: Secondary | ICD-10-CM | POA: Diagnosis not present

## 2011-11-30 DIAGNOSIS — M5410 Radiculopathy, site unspecified: Secondary | ICD-10-CM

## 2011-11-30 DIAGNOSIS — I2699 Other pulmonary embolism without acute cor pulmonale: Secondary | ICD-10-CM | POA: Diagnosis not present

## 2011-11-30 DIAGNOSIS — M545 Low back pain, unspecified: Secondary | ICD-10-CM

## 2011-11-30 DIAGNOSIS — M79609 Pain in unspecified limb: Secondary | ICD-10-CM | POA: Diagnosis not present

## 2011-11-30 NOTE — Telephone Encounter (Signed)
Received referral from Dr. Wende Neighbors for this pt to have a screening colonoscopy. Mailed it to the [place] address that was on the referral. Some gentleman brought it by the office and told Manuela Schwartz that this was his mom and she died 76 years ago.  I called Dr. Juel Burrow office and spoke to [place]. She confirmed that the address should be [place], so I am mailing out the info to that address.

## 2011-12-05 ENCOUNTER — Ambulatory Visit (HOSPITAL_COMMUNITY)
Admission: RE | Admit: 2011-12-05 | Discharge: 2011-12-05 | Disposition: A | Discharge status: Home / Self Care | Payer: Medicare Other | Source: Ambulatory Visit | Attending: Internal Medicine | Admitting: Internal Medicine

## 2011-12-05 DIAGNOSIS — M545 Low back pain, unspecified: Secondary | ICD-10-CM | POA: Insufficient documentation

## 2011-12-05 DIAGNOSIS — M5137 Other intervertebral disc degeneration, lumbosacral region: Secondary | ICD-10-CM | POA: Diagnosis not present

## 2011-12-05 DIAGNOSIS — M48061 Spinal stenosis, lumbar region without neurogenic claudication: Secondary | ICD-10-CM | POA: Diagnosis not present

## 2011-12-05 DIAGNOSIS — M5410 Radiculopathy, site unspecified: Secondary | ICD-10-CM

## 2011-12-05 DIAGNOSIS — M51379 Other intervertebral disc degeneration, lumbosacral region without mention of lumbar back pain or lower extremity pain: Secondary | ICD-10-CM | POA: Insufficient documentation

## 2011-12-05 DIAGNOSIS — M5126 Other intervertebral disc displacement, lumbar region: Secondary | ICD-10-CM | POA: Diagnosis not present

## 2011-12-14 ENCOUNTER — Telehealth: Payer: Self-pay

## 2011-12-14 NOTE — Telephone Encounter (Signed)
Pt is scheduled for OV with Neil Crouch, PA on 12/20/2011. Has family hx of colon cancer, daughter. ( Appt time is 10:00 Am).

## 2011-12-20 ENCOUNTER — Encounter: Payer: Self-pay | Admitting: Gastroenterology

## 2011-12-20 ENCOUNTER — Other Ambulatory Visit: Payer: Self-pay | Admitting: Gastroenterology

## 2011-12-20 ENCOUNTER — Ambulatory Visit (INDEPENDENT_AMBULATORY_CARE_PROVIDER_SITE_OTHER): Payer: Medicare Other | Admitting: Gastroenterology

## 2011-12-20 VITALS — BP 145/86 | HR 80 | Temp 97.0°F | Ht 65.5 in | Wt 184.4 lb

## 2011-12-20 DIAGNOSIS — Z86718 Personal history of other venous thrombosis and embolism: Secondary | ICD-10-CM | POA: Diagnosis not present

## 2011-12-20 DIAGNOSIS — Z7901 Long term (current) use of anticoagulants: Secondary | ICD-10-CM

## 2011-12-20 DIAGNOSIS — K625 Hemorrhage of anus and rectum: Secondary | ICD-10-CM | POA: Insufficient documentation

## 2011-12-20 DIAGNOSIS — I2699 Other pulmonary embolism without acute cor pulmonale: Secondary | ICD-10-CM

## 2011-12-20 DIAGNOSIS — Z800 Family history of malignant neoplasm of digestive organs: Secondary | ICD-10-CM

## 2011-12-20 MED ORDER — PEG 3350-KCL-NA BICARB-NACL 420 G PO SOLR: 4000.0000 mL | ORAL | Status: DC

## 2011-12-20 NOTE — Progress Notes (Signed)
Faxed to PCP

## 2011-12-20 NOTE — Assessment & Plan Note (Signed)
Patient presents today for further evaluation of intermittent small volume rectal bleeding. She has a family history of colon cancer in 2 first-degree relatives. Her daughter was in her 66s when diagnosed, her brother was at age 74. Patient has never had a colonoscopy. She denies any history anemia although did have a mild anemia during last year's hospitalization at time of her DVT/PE in October 2012. She is on chronic anticoagulation therapy for history of DVT and pulmonary embolus. According to her history she had DVT previously and was on Coumadin for a period of time until she was switched to Plavix and aspirin for some sort reaction. She's been back on Coumadin now for a year after her second DVT along with pulmonary embolus.  We will check a CBC to evaluate for anemia. I have offered her a colonoscopy for rectal bleeding and her family history of colon cancer. We will plan to hold her Coumadin 4 days prior to procedure. I will contact Dr. Juel Burrow office and verified it is okay with him that we hold Coumadin.  I have discussed the risks, alternatives, benefits with regards to but not limited to the risk of reaction to medication, bleeding, infection, perforation and the patient is agreeable to proceed. Written consent to be obtained.

## 2011-12-20 NOTE — Progress Notes (Signed)
Primary Care Physician:  Wende Neighbors, MD  Primary Gastroenterologist:  Barney Drain, MD   Chief Complaint  Patient presents with  . Colonoscopy    HPI:  Lisa Lucas is a 76 y.o. female here at request of Dr. Wende Neighbors for further evaluation of rectal bleeding. She has rare heartburn. No dysphagia. No vomiting. Good appetite. No constipation or diarrhea. Some brbpr on toilet tissue about one month ago. No melena. No h/o anemia per patient, but some anemia during hospitalization 12/2010 for DVT/PE. Initially Hgb was 12.2, at discharge 11.3. No abdominal pain or weight loss. FH of colon cancer in two first degree relatives, daughter was in her 28s, brother was 42.   Current Outpatient Prescriptions  Medication Sig Dispense Refill  . Calcium Carbonate-Vitamin D (CALCIUM 600 + D PO) Take 1 tablet by mouth daily.        . Calcium-Magnesium-Zinc 1000-400-15 MG TABS Take 1 tablet by mouth daily.        Marland Kitchen gabapentin (NEURONTIN) 300 MG capsule 1 capsule by mouth twice daily for a week then 3 times daily  80 capsule  0  . lisinopril-hydrochlorothiazide (PRINZIDE,ZESTORETIC) 20-12.5 MG per tablet Take 1 tablet by mouth daily.        Marland Kitchen warfarin (COUMADIN) 5 MG tablet Take 5 mg by mouth. Monday, Wednesday, Thursday, Friday, and Saturday a whole talbet Sunday and Tuesday half a tablet      . DISCONTD: warfarin (COUMADIN) 5 MG tablet Alternate one whole tablet/one half tablet nightly or as directed  20 tablet  0  . famotidine (PEPCID) 20 MG tablet Take 20 mg by mouth 2 (two) times daily as needed. gas         Allergies as of 12/20/2011  . (No Known Allergies)    Past Medical History  Diagnosis Date  . History of kidney cancer   . Hypertension   . DVT (deep venous thrombosis) 12/2010    right lower extremity and PE but also had prior DVT and had been on coumadin but converted to plavix/asa due to "reaction" until she presented with another DVT/PE in 12/2010.  Marland Kitchen Hypercholesteremia   .  Osteoporosis   . Pulmonary embolism 12/2010  . Spinal stenosis     Past Surgical History  Procedure Date  . Abdominal hysterectomy   . Hernia repair   . Appendectomy   . Kidney surgery 2010    bilateral renal cancer s/p bilateral partial nephrectomy  . Leg surgery     right leg for blood clot    Family History  Problem Relation Age of Onset  . Colon cancer Daughter     age 59s  . Colon cancer Brother     age 12    History   Social History  . Marital Status: Widowed    Spouse Name: N/A    Number of Children: 3  . Years of Education: N/A   Occupational History  . Not on file.   Social History Main Topics  . Smoking status: Former Smoker    Quit date: 12/10/2010  . Smokeless tobacco: Not on file  . Alcohol Use: No  . Drug Use: No  . Sexually Active: Not Currently   Other Topics Concern  . Not on file   Social History Narrative  . No narrative on file      ROS:  General: Negative for anorexia, weight loss, fever, chills, fatigue, weakness. Eyes: Negative for vision changes.  ENT: Negative for hoarseness, difficulty swallowing , nasal  congestion. CV: Negative for chest pain, angina, palpitations, dyspnea on exertion, peripheral edema.  Respiratory: Negative for dyspnea at rest, dyspnea on exertion, cough, sputum, wheezing.  GI: See history of present illness. GU:  Negative for dysuria, hematuria, urinary incontinence, urinary frequency, nocturnal urination.  MS: Negative for joint pain. C/O low back pain and left leg pain.  Derm: Negative for rash or itching.  Neuro: Negative for weakness, abnormal sensation, seizure, frequent headaches, memory loss, confusion.  Psych: Negative for anxiety, depression, suicidal ideation, hallucinations.  Endo: Negative for unusual weight change.  Heme: Negative for bruising or bleeding. Allergy: Negative for rash or hives.    Physical Examination:  BP 145/86  Pulse 80  Temp 97 F (36.1 C) (Temporal)  Ht 5' 5.5"  (1.664 m)  Wt 184 lb 6.4 oz (83.643 kg)  BMI 30.22 kg/m2   General: Well-nourished, well-developed in no acute distress.  Head: Normocephalic, atraumatic.   Eyes: Conjunctiva pink, no icterus. Mouth: Oropharyngeal mucosa moist and pink , no lesions erythema or exudate. Neck: Supple without thyromegaly, masses, or lymphadenopathy.  Lungs: Clear to auscultation bilaterally.  Heart: Regular rate and rhythm, no murmurs rubs or gallops.  Abdomen: Bowel sounds are normal, nontender, nondistended, no hepatosplenomegaly or masses, no abdominal bruits or    hernia , no rebound or guarding.   Rectal: not performed Extremities: No lower extremity edema. No clubbing or deformities.  Neuro: Alert and oriented x 4 , grossly normal neurologically.  Skin: Warm and dry, no rash or jaundice.   Psych: Alert and cooperative, normal mood and affect.   Imaging Studies: Mr Lumbar Spine Wo Contrast  12/05/2011  *RADIOLOGY REPORT*  Clinical Data: Low back pain radiating to both legs.  No known injury.  History of bilateral renal cell carcinoma status post bilateral partial nephrectomy in 2010.  MRI LUMBAR SPINE WITHOUT CONTRAST  Technique:  Multiplanar and multiecho pulse sequences of the lumbar spine were obtained without intravenous contrast.  Comparison: Abdominal pelvic CT 01/09/2011.  Findings: Prior CT demonstrates five lumbar type vertebral bodies. The alignment is stable with a mild convex left scoliosis and a slight anterolisthesis at the L2-L3 and L3-L4 levels.  There is no evidence of acute fracture, pars defect or osseous metastatic disease.  The conus medullaris extends to the L1-L2 level and appears normal. There are no paraspinal abnormalities. Prominence of the left adrenal gland appears stable.  An exophytic lesion projecting anteriorly from the mid left kidney appears unchanged from the prior CT, measuring 2.6 x 1.2 cm transverse.  This demonstrates intermediate T1 and low T2 signal.  There is probable  postsurgical change in the interpolar region of the right kidney.  Tiny cystic lesions are present in both kidneys.  There is no retroperitoneal lymphadenopathy. Sigmoid diverticular changes are noted.  L1-L2:  Mild disc bulging eccentric to the right.  There is mild narrowing of the right lateral recess.  The foramina appear sufficiently patent.  L2-L3:  Disc bulging, facet and ligamentous hypertrophy are asymmetric to the right.  There is mild resulting central, right lateral recess and right foraminal stenosis.  L3-L4:  Disc bulging, facet and ligamentous hypertrophy are more symmetric.  There is mild resulting central and right lateral recess stenosis.  The foramina are patent.  L4-L5:  There is disc bulging with a moderate sized posterolateral disc protrusion on the left.  This flattens the thecal sac and posteriorly displaces the left L5 nerve root.  The left foramen is mildly narrowed inferiorly without definite L4  nerve root encroachment.  The right lateral recess is mildly narrowed.  There is mild facet and ligamentous hypertrophy bilaterally.  L5-S1:  There is stable disc bulging with paraspinal osteophytes and bilateral facet hypertrophy.  Osteophytes contribute to mild left foraminal stenosis which appears stable.  There is no S1 nerve root encroachment.  IMPRESSION:  1.  Moderate sized posterolateral disc protrusion on the left at L4- L5 results in posterior displacement of the left L5 nerve root. The right lateral recess is also mildly narrowed. 2.  Mild multifactorial spinal stenosis at L2-L3 and L3-L4.  The right lateral recess is mildly narrowed at both levels, and at L2- L3, the right foramen is mildly narrowed. 3.  Chronic degenerative disc disease at L5-S1 with stable chronic mild left foraminal stenosis. 4.  No evidence of metastatic disease.  The kidneys appear grossly unchanged from the prior CT of 11 months ago with an indeterminate lesion involving the anterior interpolar left kidney,  possibly an ablated renal cell carcinoma.   Original Report Authenticated By: Vivia Ewing, M.D.

## 2011-12-21 DIAGNOSIS — D649 Anemia, unspecified: Secondary | ICD-10-CM | POA: Diagnosis not present

## 2011-12-21 DIAGNOSIS — M79609 Pain in unspecified limb: Secondary | ICD-10-CM | POA: Diagnosis not present

## 2011-12-21 DIAGNOSIS — Z7901 Long term (current) use of anticoagulants: Secondary | ICD-10-CM | POA: Diagnosis not present

## 2011-12-21 DIAGNOSIS — K921 Melena: Secondary | ICD-10-CM | POA: Diagnosis not present

## 2011-12-21 LAB — CBC WITH DIFFERENTIAL/PLATELET
Basophils Relative: 1 % (ref 0–1)
Eosinophils Absolute: 0.1 10*3/uL (ref 0.0–0.7)
HCT: 37.2 % (ref 36.0–46.0)
Hemoglobin: 12.3 g/dL (ref 12.0–15.0)
MCH: 29.6 pg (ref 26.0–34.0)
MCHC: 33.1 g/dL (ref 30.0–36.0)
MCV: 89.6 fL (ref 78.0–100.0)
Monocytes Absolute: 0.4 10*3/uL (ref 0.1–1.0)
Monocytes Relative: 10 % (ref 3–12)
Neutro Abs: 1.9 10*3/uL (ref 1.7–7.7)

## 2011-12-22 ENCOUNTER — Telehealth: Payer: Self-pay

## 2011-12-22 NOTE — Telephone Encounter (Signed)
noted 

## 2011-12-22 NOTE — Telephone Encounter (Signed)
I also called the pt and confirmed that Dr. Nevada Crane had told her to hold the coumadin after Sun 12/24/2011.

## 2011-12-22 NOTE — Progress Notes (Signed)
Quick Note:  Please let pt know her H/H looks good. TCS as planned. ______

## 2011-12-22 NOTE — Telephone Encounter (Signed)
I had faxed over request to Dr. Wende Neighbors to ask if OK to hold coumadin 4 days prior to colonoscopy. I called and spoke to Sybil at his office this AM and she said that pt was seen in their office yesterday and Dr. Nevada Crane said it was OK to hold the coumadin for 4 days prior to procedure and then she could resume. Pt is scheduled for 12/29/2011, and Dr. Nevada Crane told pt that she could hold after Sunday October 6th. She is going to fax the note to [place] also.

## 2011-12-25 ENCOUNTER — Encounter (HOSPITAL_COMMUNITY): Payer: Self-pay

## 2011-12-25 NOTE — Progress Notes (Signed)
Quick Note:  Called and informed pt. ______ 

## 2011-12-28 MED ORDER — SODIUM CHLORIDE 0.45 % IV SOLN
INTRAVENOUS | Status: DC
  Administered 2011-12-29: 11:00:00 via INTRAVENOUS

## 2011-12-29 ENCOUNTER — Ambulatory Visit (HOSPITAL_COMMUNITY)
Admission: RE | Admit: 2011-12-29 | Discharge: 2011-12-29 | Disposition: A | Discharge status: Home / Self Care | Payer: Medicare Other | Source: Ambulatory Visit | Attending: Gastroenterology | Admitting: Gastroenterology

## 2011-12-29 ENCOUNTER — Encounter (HOSPITAL_COMMUNITY)
Admission: RE | Disposition: A | Discharge status: Home / Self Care | Payer: Self-pay | Source: Ambulatory Visit | Attending: Gastroenterology

## 2011-12-29 ENCOUNTER — Encounter (HOSPITAL_COMMUNITY): Payer: Self-pay

## 2011-12-29 DIAGNOSIS — K625 Hemorrhage of anus and rectum: Secondary | ICD-10-CM

## 2011-12-29 DIAGNOSIS — D126 Benign neoplasm of colon, unspecified: Secondary | ICD-10-CM | POA: Diagnosis not present

## 2011-12-29 DIAGNOSIS — E7800 Pure hypercholesterolemia, unspecified: Secondary | ICD-10-CM | POA: Diagnosis not present

## 2011-12-29 DIAGNOSIS — Z7901 Long term (current) use of anticoagulants: Secondary | ICD-10-CM

## 2011-12-29 DIAGNOSIS — Z800 Family history of malignant neoplasm of digestive organs: Secondary | ICD-10-CM

## 2011-12-29 DIAGNOSIS — I10 Essential (primary) hypertension: Secondary | ICD-10-CM | POA: Diagnosis not present

## 2011-12-29 DIAGNOSIS — K644 Residual hemorrhoidal skin tags: Secondary | ICD-10-CM | POA: Insufficient documentation

## 2011-12-29 DIAGNOSIS — K5730 Diverticulosis of large intestine without perforation or abscess without bleeding: Secondary | ICD-10-CM

## 2011-12-29 HISTORY — PX: COLONOSCOPY: SHX5424

## 2011-12-29 SURGERY — COLONOSCOPY
Anesthesia: Moderate Sedation

## 2011-12-29 MED ORDER — MEPERIDINE HCL 100 MG/ML IJ SOLN
INTRAMUSCULAR | Status: AC
  Filled 2011-12-29: qty 2

## 2011-12-29 MED ORDER — MIDAZOLAM HCL 5 MG/5ML IJ SOLN
INTRAMUSCULAR | Status: AC
  Filled 2011-12-29: qty 10

## 2011-12-29 MED ORDER — MEPERIDINE HCL 100 MG/ML IJ SOLN
INTRAMUSCULAR | Status: DC | PRN
  Administered 2011-12-29 (×2): 25 mg via INTRAVENOUS

## 2011-12-29 MED ORDER — STERILE WATER FOR IRRIGATION IR SOLN
Status: DC | PRN
  Administered 2011-12-29: 11:00:00

## 2011-12-29 MED ORDER — MIDAZOLAM HCL 5 MG/5ML IJ SOLN
INTRAMUSCULAR | Status: DC | PRN
  Administered 2011-12-29: 1 mg via INTRAVENOUS
  Administered 2011-12-29: 2 mg via INTRAVENOUS
  Administered 2011-12-29: 1 mg via INTRAVENOUS

## 2011-12-29 NOTE — H&P (Signed)
  Primary Care Physician:  Wende Neighbors, MD Primary Gastroenterologist:  Dr. Oneida Alar  Pre-Procedure History & Physical: HPI:  KLOI BAZ is a 76 y.o. female here for  BRBPR.   Past Medical History  Diagnosis Date  . History of kidney cancer   . Hypertension   . DVT (deep venous thrombosis) 12/2010    right lower extremity and PE but also had prior DVT and had been on coumadin but converted to plavix/asa due to "reaction" until she presented with another DVT/PE in 12/2010.  Marland Kitchen Hypercholesteremia   . Osteoporosis   . Pulmonary embolism 12/2010  . Spinal stenosis     Past Surgical History  Procedure Date  . Abdominal hysterectomy   . Hernia repair   . Appendectomy   . Kidney surgery 2010    bilateral renal cancer s/p bilateral partial nephrectomy  . Leg surgery     right leg for blood clot    Prior to Admission medications   Medication Sig Start Date End Date Taking? Authorizing Provider  Calcium Carbonate-Vitamin D (CALCIUM 600 + D PO) Take 1 tablet by mouth daily.    Yes Historical Provider, MD  lisinopril-hydrochlorothiazide (PRINZIDE,ZESTORETIC) 20-12.5 MG per tablet Take 1 tablet by mouth daily.    Yes Historical Provider, MD  warfarin (COUMADIN) 5 MG tablet Take 2.5-5 mg by mouth daily. Take a whole tablet on Monday, Wednesday, Thursday, Friday, and Saturday. Take  half a tablet on Sunday and Tuesday 01/14/11  Yes Delfina Redwood, MD    Allergies as of 12/20/2011  . (No Known Allergies)    Family History  Problem Relation Age of Onset  . Colon cancer Daughter     age 56s  . Colon cancer Brother     age 73    History   Social History  . Marital Status: Widowed    Spouse Name: N/A    Number of Children: 3  . Years of Education: N/A   Occupational History  . Not on file.   Social History Main Topics  . Smoking status: Former Smoker    Quit date: 12/10/2010  . Smokeless tobacco: Not on file  . Alcohol Use: No  . Drug Use: No  . Sexually Active:  Not Currently   Other Topics Concern  . Not on file   Social History Narrative  . No narrative on file    Review of Systems: See HPI, otherwise negative ROS   Physical Exam: BP 138/83  Pulse 78  Temp 97.7 F (36.5 C) (Oral)  Resp 23 General:   Alert,  pleasant and cooperative in NAD Head:  Normocephalic and atraumatic. Neck:  Supple; Lungs:  Clear throughout to auscultation.    Heart:  Regular rate and rhythm. Abdomen:  Soft, nontender and nondistended. Normal bowel sounds, without guarding, and without rebound.   Neurologic:  Alert and  oriented x4;  grossly normal neurologically.  Impression/Plan:    BRBPR  PLAN: TCS TODAY

## 2011-12-29 NOTE — Op Note (Addendum)
[facility] [address] [place], [address]   COLONOSCOPY PROCEDURE REPORT  PATIENT: Lisa Lucas, Lisa Lucas  MR#: [id number] BIRTHDATE: Aug 10, 1935 , 35  yrs. old GENDER: Female ENDOSCOPIST: Barney Drain, MD REFERRED [id number] Hall, M.D. PROCEDURE DATE:  12/29/2011 PROCEDURE:   Colonoscopy with biopsy and Colonoscopy with snare polypectomy INDICATIONS:rectal bleeding. MEDICATIONS: Demerol 50 mg IV and Versed 4 mg IV  DESCRIPTION OF PROCEDURE:    Physical exam was performed.  Informed consent was obtained from the patient after explaining the benefits, risks, and alternatives to procedure.  The patient was connected to monitor and placed in left lateral position. Continuous oxygen was provided by nasal cannula and IV medicine administered through an indwelling cannula.  After administration of sedation and rectal exam, the patients rectum was intubated and the [id number])  colonoscope was advanced under direct visualization to the cecum.  The scope was removed slowly by carefully examining the color, texture, anatomy, and integrity mucosa on the way out.  The patient was recovered in endoscopy and discharged home in satisfactory condition.       COLON FINDINGS: Four sessile polyps measuring 3-9 mm in size were found in the ascending colon, descending colon, and sigmoid colon. A polypectomy was performed with cold forceps and using snare cautery.  The resection was complete and the polyp tissue was completely retrieved, Moderate diverticulosis was noted in the transverse colon, descending colon, and sigmoid colon.  , The colon mucosa was otherwise normal.  , and Moderate sized hemorrhoids were found.  PREP QUALITY: good. CECAL W/D TIME: 29 minutes  COMPLICATIONS: None  ENDOSCOPIC IMPRESSION: 1.   Four sessile polyps measuring 3-9 mm in size were found in the ascending colon, descending colon, and sigmoid colon; polypectomy was performed with cold  forceps and using snare cautery 2.   Moderate diverticulosis was noted in the transverse colon, descending colon, and sigmoid colon 3.   The colon mucosa was otherwise normal 4.   Moderate sized hemorrhoids CAUSING INTERMITTENT RECTAL BLEEDING   RECOMMENDATIONS: AWAIT BIOPSY.  FOLLOW A SOFT DIET.  START A HIGH FIBER DIET OCT 17.  AVOID ITEMS THAT CAUSE BLOATING.  HOLD COUMADIN.  RESTART 10/17.  BIOPSY RESULTS SHOULD BE BACK IN 7 DAYS.  NO MRI FOR 30 DAYS.  NEXT TCS WITH AN OVERTUBE IN 10-15 YEARS IF THE BENEFITS OUTWEIGH THE RISKS.      _______________________________ Lorrin MaisBarney Drain, MD 12/29/2011 2:55 PM Revised: 12/29/2011 2:55 PM    PATIENT NAME:  Lisa Lucas MR#: [id number]

## 2012-01-01 ENCOUNTER — Telehealth: Payer: Self-pay | Admitting: Gastroenterology

## 2012-01-01 DIAGNOSIS — G894 Chronic pain syndrome: Secondary | ICD-10-CM | POA: Diagnosis not present

## 2012-01-01 DIAGNOSIS — M545 Low back pain, unspecified: Secondary | ICD-10-CM | POA: Diagnosis not present

## 2012-01-01 DIAGNOSIS — M5126 Other intervertebral disc displacement, lumbar region: Secondary | ICD-10-CM | POA: Diagnosis not present

## 2012-01-01 DIAGNOSIS — IMO0002 Reserved for concepts with insufficient information to code with codable children: Secondary | ICD-10-CM | POA: Diagnosis not present

## 2012-01-01 NOTE — Telephone Encounter (Signed)
Please call pt. She had a simple adenoma removed from her colon.    FOLLOW A SOFT DIET.  START A HIGH FIBER DIET OCT 17. AVOID ITEMS THAT CAUSE BLOATING.   HOLD COUMADIN. RESTART 10/17.  NO MRI FOR 30 DAYS.

## 2012-01-02 NOTE — Telephone Encounter (Signed)
Called and informed pt.  

## 2012-01-03 ENCOUNTER — Encounter (HOSPITAL_COMMUNITY): Payer: Self-pay | Admitting: Gastroenterology

## 2012-01-03 DIAGNOSIS — M5126 Other intervertebral disc displacement, lumbar region: Secondary | ICD-10-CM | POA: Diagnosis not present

## 2012-01-03 DIAGNOSIS — IMO0002 Reserved for concepts with insufficient information to code with codable children: Secondary | ICD-10-CM | POA: Diagnosis not present

## 2012-01-03 DIAGNOSIS — M545 Low back pain, unspecified: Secondary | ICD-10-CM | POA: Diagnosis not present

## 2012-01-03 NOTE — Telephone Encounter (Signed)
Path faxed to PCP 

## 2012-01-11 DIAGNOSIS — Z7901 Long term (current) use of anticoagulants: Secondary | ICD-10-CM | POA: Diagnosis not present

## 2012-01-11 DIAGNOSIS — I803 Phlebitis and thrombophlebitis of lower extremities, unspecified: Secondary | ICD-10-CM | POA: Diagnosis not present

## 2012-01-11 DIAGNOSIS — I10 Essential (primary) hypertension: Secondary | ICD-10-CM | POA: Diagnosis not present

## 2012-01-18 DIAGNOSIS — M5126 Other intervertebral disc displacement, lumbar region: Secondary | ICD-10-CM | POA: Diagnosis not present

## 2012-01-18 DIAGNOSIS — IMO0002 Reserved for concepts with insufficient information to code with codable children: Secondary | ICD-10-CM | POA: Diagnosis not present

## 2012-01-18 DIAGNOSIS — M545 Low back pain, unspecified: Secondary | ICD-10-CM | POA: Diagnosis not present

## 2012-01-21 ENCOUNTER — Emergency Department (HOSPITAL_COMMUNITY)
Admission: EM | Admit: 2012-01-21 | Discharge: 2012-01-21 | Disposition: A | Discharge status: Home / Self Care | Payer: Medicare Other | Attending: Emergency Medicine | Admitting: Emergency Medicine

## 2012-01-21 ENCOUNTER — Encounter (HOSPITAL_COMMUNITY): Payer: Self-pay | Admitting: *Deleted

## 2012-01-21 DIAGNOSIS — M79609 Pain in unspecified limb: Secondary | ICD-10-CM | POA: Diagnosis not present

## 2012-01-21 DIAGNOSIS — Z8739 Personal history of other diseases of the musculoskeletal system and connective tissue: Secondary | ICD-10-CM | POA: Insufficient documentation

## 2012-01-21 DIAGNOSIS — E7800 Pure hypercholesterolemia, unspecified: Secondary | ICD-10-CM | POA: Diagnosis not present

## 2012-01-21 DIAGNOSIS — Z86711 Personal history of pulmonary embolism: Secondary | ICD-10-CM | POA: Insufficient documentation

## 2012-01-21 DIAGNOSIS — M810 Age-related osteoporosis without current pathological fracture: Secondary | ICD-10-CM | POA: Insufficient documentation

## 2012-01-21 DIAGNOSIS — Z85528 Personal history of other malignant neoplasm of kidney: Secondary | ICD-10-CM | POA: Diagnosis not present

## 2012-01-21 DIAGNOSIS — I10 Essential (primary) hypertension: Secondary | ICD-10-CM | POA: Insufficient documentation

## 2012-01-21 DIAGNOSIS — G8929 Other chronic pain: Secondary | ICD-10-CM | POA: Diagnosis not present

## 2012-01-21 DIAGNOSIS — Z86718 Personal history of other venous thrombosis and embolism: Secondary | ICD-10-CM | POA: Diagnosis not present

## 2012-01-21 DIAGNOSIS — E875 Hyperkalemia: Secondary | ICD-10-CM | POA: Diagnosis not present

## 2012-01-21 DIAGNOSIS — Z87891 Personal history of nicotine dependence: Secondary | ICD-10-CM | POA: Diagnosis not present

## 2012-01-21 DIAGNOSIS — Z79899 Other long term (current) drug therapy: Secondary | ICD-10-CM | POA: Diagnosis not present

## 2012-01-21 LAB — CBC WITH DIFFERENTIAL/PLATELET
Basophils Absolute: 0 10*3/uL (ref 0.0–0.1)
Basophils Relative: 1 % (ref 0–1)
Eosinophils Absolute: 0.1 10*3/uL (ref 0.0–0.7)
Eosinophils Relative: 2 % (ref 0–5)
HCT: 40 % (ref 36.0–46.0)
MCHC: 32 g/dL (ref 30.0–36.0)
Monocytes Absolute: 0.4 10*3/uL (ref 0.1–1.0)
Neutro Abs: 2.4 10*3/uL (ref 1.7–7.7)
RDW: 14.3 % (ref 11.5–15.5)

## 2012-01-21 LAB — BASIC METABOLIC PANEL
Calcium: 9.5 mg/dL (ref 8.4–10.5)
Creatinine, Ser: 1.68 mg/dL — ABNORMAL HIGH (ref 0.50–1.10)
GFR calc Af Amer: 34 mL/min — ABNORMAL LOW (ref >90)
GFR calc non Af Amer: 29 mL/min — ABNORMAL LOW (ref >90)

## 2012-01-21 LAB — TROPONIN I: Troponin I: <0.3 ng/mL (ref <0.30)

## 2012-01-21 MED ORDER — SODIUM POLYSTYRENE SULFONATE 15 GM/60ML PO SUSP
30.0000 g | Freq: Once | ORAL | Status: AC
  Administered 2012-01-21: 30 g via ORAL
  Filled 2012-01-21: qty 120

## 2012-01-21 MED ORDER — FENTANYL CITRATE 0.05 MG/ML IJ SOLN
50.0000 ug | Freq: Once | INTRAMUSCULAR | Status: AC
  Administered 2012-01-21: 50 ug via INTRAVENOUS
  Filled 2012-01-21: qty 2

## 2012-01-21 MED ORDER — FENTANYL CITRATE 0.05 MG/ML IJ SOLN: 50.0000 ug | Freq: Once | INTRAMUSCULAR | Status: DC

## 2012-01-21 NOTE — ED Notes (Signed)
I gave the patient a blanket.

## 2012-01-21 NOTE — ED Notes (Signed)
PT discharged to home with family. NAD. 

## 2012-01-21 NOTE — ED Notes (Signed)
Patient present to ED with lower left leg pain for a while and today the pain was worse.  Patient has DVT on that left leg and is currently taking medications for it.  Patient is also experiencing shortness of breath

## 2012-01-21 NOTE — ED Provider Notes (Signed)
History     CSN: [id number]  Arrival date & time 01/21/12  1332   First MD Initiated Contact with Patient 01/21/12 1515      Chief Complaint  Patient presents with  . Leg Pain    (Consider location/radiation/quality/duration/timing/severity/associated sxs/prior treatment) HPI  76 year old female with hx of DVT on Coumadin presents c/o Left lower leg pain.  Hx was obtained from pt and her family member at bedside.  Pt has been having persistent left leg pain for over a year.  Pain begins in her lower back and radiates down to her left leg.  She was seen by a neurosurgeon (Dr. Joya Salm) this past week and he felt she will need back surgery for hernia repair to treat her chronic pain.  However, for the past 2 days the pain has intensified despite taking her home meds including tylenol and gabapentin (pt cannot tolerates codeine).  Pain is described as throbbing sensation, worsening with movement or with bending over.  Pt also c/o pins and needles sensation when the pain intensified.  She however denies fever, rash, saddles anesthesia or urinary/bowel incontinence.  Pt also has hx of PE and DVT in same leg and have been taking coumadin.  She denies SOB unless when pain is severe which causes her to grunt.  Denies chest pain.  Has not notice increase leg swelling.    Past Medical History  Diagnosis Date  . History of kidney cancer   . Hypertension   . DVT (deep venous thrombosis) 12/2010    right lower extremity and PE but also had prior DVT and had been on coumadin but converted to plavix/asa due to "reaction" until she presented with another DVT/PE in 12/2010.  Marland Kitchen Hypercholesteremia   . Osteoporosis   . Pulmonary embolism 12/2010  . Spinal stenosis     Past Surgical History  Procedure Date  . Abdominal hysterectomy   . Hernia repair   . Appendectomy   . Kidney surgery 2010    bilateral renal cancer s/p bilateral partial nephrectomy  . Leg surgery     right leg for blood clot  .  Colonoscopy 12/29/2011    Procedure: COLONOSCOPY;  Surgeon: Danie Binder, MD;  Location: [facility];  Service: Endoscopy;  Laterality: N/A;  11:30    Family History  Problem Relation Age of Onset  . Colon cancer Daughter     age 33s  . Colon cancer Brother     age 30    History  Substance Use Topics  . Smoking status: Former Smoker    Quit date: 12/10/2010  . Smokeless tobacco: Not on file  . Alcohol Use: No    OB History    Grav Para Term Preterm Abortions TAB SAB Ect Mult Living                  Review of Systems  All other systems reviewed and are negative.    Allergies  Hydrocodone and Oxycodone  Home Medications   Current Outpatient Rx  Name  Route  Sig  Dispense  Refill  . CALCIUM 600 + D PO   Oral   Take 1 tablet by mouth daily.          Marland Kitchen LISINOPRIL-HYDROCHLOROTHIAZIDE 20-12.5 MG PO TABS   Oral   Take 1 tablet by mouth daily.            BP 132/69  Pulse 83  Temp 98.6 F (37 C) (Oral)  Resp 20  SpO2 95%  Physical Exam  Nursing note and vitals reviewed. Constitutional: She is oriented to person, place, and time. She appears well-developed and well-nourished. No distress.       Awake, alert, nontoxic appearance  HENT:  Head: Atraumatic.  Eyes: Conjunctivae normal are normal. Right eye exhibits no discharge. Left eye exhibits no discharge.  Neck: Neck supple.  Cardiovascular: Normal rate, regular rhythm and intact distal pulses.  Exam reveals no gallop and no friction rub.   No murmur heard. Pulmonary/Chest: Effort normal. No respiratory distress. She has no wheezes. She has no rales. She exhibits no tenderness.  Abdominal: Soft. There is no tenderness. There is no rebound.  Musculoskeletal: She exhibits no edema and no tenderness.       ROM appears intact, no obvious focal weakness.  Mild tenderness to paralumbar region, no midline spine tenderness, no step off.   Positive straight leg raise L>R.  Dorsalis Pedis and Posterior  Tibialis pulses palpable bilat.    BLE: No palpable cords, erythema, calves swelling, negative Homan's sign.     Neurological: She is alert and oriented to person, place, and time.       Mental status and motor strength appears intact.  Patella DTR 2+ bilaterally, no foot drop.    Skin: No rash noted.  Psychiatric: She has a normal mood and affect.    ED Course  Procedures (including critical care time)   Labs Reviewed  CBC WITH DIFFERENTIAL  BASIC METABOLIC PANEL  D-DIMER, QUANTITATIVE   No results found.   No diagnosis found.   Date: 01/21/2012  Rate: 65  Rhythm: normal sinus rhythm  QRS Axis: normal  Intervals: normal  ST/T Wave abnormalities: normal  Conduction Disutrbances: none  Narrative Interpretation:   Old EKG Reviewed: No significant changes noted  Results for orders placed during the hospital encounter of 01/21/12  CBC WITH DIFFERENTIAL      Component Value Range   WBC 4.1  4.0 - 10.5 K/uL   RBC 4.23  3.87 - 5.11 MIL/uL   Hemoglobin 12.8  12.0 - 15.0 g/dL   HCT 40.0  36.0 - 46.0 %   MCV 94.6  78.0 - 100.0 fL   MCH 30.3  26.0 - 34.0 pg   MCHC 32.0  30.0 - 36.0 g/dL   RDW 14.3  11.5 - 15.5 %   Platelets 175  150 - 400 K/uL   Neutrophils Relative 57  43 - 77 %   Neutro Abs 2.4  1.7 - 7.7 K/uL   Lymphocytes Relative 30  12 - 46 %   Lymphs Abs 1.2  0.7 - 4.0 K/uL   Monocytes Relative 10  3 - 12 %   Monocytes Absolute 0.4  0.1 - 1.0 K/uL   Eosinophils Relative 2  0 - 5 %   Eosinophils Absolute 0.1  0.0 - 0.7 K/uL   Basophils Relative 1  0 - 1 %   Basophils Absolute 0.0  0.0 - 0.1 K/uL  BASIC METABOLIC PANEL      Component Value Range   Sodium 134 (*) 135 - 145 mEq/L   Potassium 5.5 (*) 3.5 - 5.1 mEq/L   Chloride 107  96 - 112 mEq/L   CO2 17 (*) 19 - 32 mEq/L   Glucose, Bld 88  70 - 99 mg/dL   BUN 34 (*) 6 - 23 mg/dL   Creatinine, Ser 1.68 (*) 0.50 - 1.10 mg/dL   Calcium 9.5  8.4 - 10.5 mg/dL  GFR calc non Af Amer 29 (*) >90 mL/min   GFR  calc Af Amer 34 (*) >90 mL/min  TROPONIN I      Component Value Range   Troponin I <0.30  <0.30 ng/mL  PROTIME-INR      Component Value Range   Prothrombin Time 26.7 (*) 11.6 - 15.2 seconds   INR 2.62 (*) 0.00 - 1.49   No results found.  1. Acute on chronic leg pain.   2. hyperkalemia   MDM  Acute on chronic left lower leg pain 2/2 spinal stenosis and hernia.  NVI, neg homan's sign.  Has hx of PE/DVT but currently on Coumadin.  Since her visit is primarily for pain control, fentanyl given.  Will check CBC/BMP, PT/INR, ECG.  Discussed care with my attending.  Suspicion for PE/DVT is low.     4:57 PM Pt reports improvement of leg pain from receiving fentanyl.  Her labs remarkable for K+ 5.5. No changes in EKG.  Will give kayexalate 30g PO and will recommend pt to f/u with PCP for K+ recheck. Her renal function is abnormal but not far from her baseline, and pt has hx of kidney disease (cancer requires surgery).   Her coumadin is therapeutic.  Will ambulate pt and if she's able to ambulate will d/c with close f/u.  My attending is aware.    BP 148/71  Pulse 60  Temp 98.1 F (36.7 C) (Oral)  Resp 16  SpO2 100%  5:39 PM Pt able to ambulate in the hall.  She does reports back pain and request another dose of treatment.  I recommend pt to f/u closely with PCP for further care  Pt does not have anaphylactic rxn to Codeine, just side effect (nausea, abd discomfort, diarrhea).  Therefore, her PCP may placed her on appropriate pain medication as needed.  Otherwise, pt does not meet criteria for admission.  Doubt DVT, doubt PE, doubt cardiac etiology, no red flags.    Domenic Moras, PA-C 01/21/12 1742

## 2012-01-21 NOTE — ED Provider Notes (Signed)
Medical screening examination/treatment/procedure(s) were performed by non-physician practitioner and as supervising physician I was immediately available for consultation/collaboration.  Varney Biles, MD 01/21/12 2329

## 2012-01-21 NOTE — ED Notes (Signed)
Patient was able to ambulate without any problems.

## 2012-01-25 DIAGNOSIS — C649 Malignant neoplasm of unspecified kidney, except renal pelvis: Secondary | ICD-10-CM | POA: Diagnosis not present

## 2012-01-25 DIAGNOSIS — K5730 Diverticulosis of large intestine without perforation or abscess without bleeding: Secondary | ICD-10-CM | POA: Diagnosis not present

## 2012-01-25 DIAGNOSIS — N390 Urinary tract infection, site not specified: Secondary | ICD-10-CM | POA: Diagnosis not present

## 2012-01-25 DIAGNOSIS — I7090 Unspecified atherosclerosis: Secondary | ICD-10-CM | POA: Diagnosis not present

## 2012-01-25 DIAGNOSIS — N323 Diverticulum of bladder: Secondary | ICD-10-CM | POA: Diagnosis not present

## 2012-01-25 DIAGNOSIS — N289 Disorder of kidney and ureter, unspecified: Secondary | ICD-10-CM | POA: Diagnosis not present

## 2012-01-25 DIAGNOSIS — I2510 Atherosclerotic heart disease of native coronary artery without angina pectoris: Secondary | ICD-10-CM | POA: Diagnosis not present

## 2012-01-26 ENCOUNTER — Other Ambulatory Visit: Payer: Self-pay | Admitting: Neurosurgery

## 2012-01-26 ENCOUNTER — Encounter (HOSPITAL_COMMUNITY): Payer: Self-pay | Admitting: Pharmacy Technician

## 2012-01-27 NOTE — Progress Notes (Signed)
REVIEWED.  TCS OCT 2013 SIMPLE ADENOMAS

## 2012-01-29 ENCOUNTER — Encounter (HOSPITAL_COMMUNITY): Payer: Self-pay

## 2012-01-29 NOTE — Progress Notes (Signed)
Forwarded chart to anesthesia for review of cardiac record.  Ok to proceed with surgery per Dr. Tobias Alexander.

## 2012-01-30 ENCOUNTER — Inpatient Hospital Stay (HOSPITAL_COMMUNITY): Payer: Medicare Other

## 2012-01-30 ENCOUNTER — Encounter (HOSPITAL_COMMUNITY): Payer: Self-pay | Admitting: Anesthesiology

## 2012-01-30 ENCOUNTER — Inpatient Hospital Stay (HOSPITAL_COMMUNITY): Payer: Medicare Other | Admitting: Anesthesiology

## 2012-01-30 ENCOUNTER — Encounter (HOSPITAL_COMMUNITY): Payer: Self-pay | Admitting: *Deleted

## 2012-01-30 ENCOUNTER — Encounter (HOSPITAL_COMMUNITY): Payer: Self-pay

## 2012-01-30 ENCOUNTER — Inpatient Hospital Stay (HOSPITAL_COMMUNITY)
Admission: RE | Admit: 2012-01-30 | Discharge: 2012-02-01 | DRG: 491 | Disposition: A | Discharge status: Home / Self Care | Payer: Medicare Other | Source: Ambulatory Visit | Attending: Neurosurgery | Admitting: Neurosurgery

## 2012-01-30 ENCOUNTER — Encounter (HOSPITAL_COMMUNITY)
Admission: RE | Disposition: A | Discharge status: Home / Self Care | Payer: Self-pay | Source: Ambulatory Visit | Attending: Neurosurgery

## 2012-01-30 DIAGNOSIS — M48061 Spinal stenosis, lumbar region without neurogenic claudication: Secondary | ICD-10-CM | POA: Diagnosis not present

## 2012-01-30 DIAGNOSIS — Z85528 Personal history of other malignant neoplasm of kidney: Secondary | ICD-10-CM | POA: Diagnosis not present

## 2012-01-30 DIAGNOSIS — Z79899 Other long term (current) drug therapy: Secondary | ICD-10-CM

## 2012-01-30 DIAGNOSIS — N179 Acute kidney failure, unspecified: Secondary | ICD-10-CM

## 2012-01-30 DIAGNOSIS — I2699 Other pulmonary embolism without acute cor pulmonale: Secondary | ICD-10-CM

## 2012-01-30 DIAGNOSIS — E7800 Pure hypercholesterolemia, unspecified: Secondary | ICD-10-CM | POA: Diagnosis present

## 2012-01-30 DIAGNOSIS — M810 Age-related osteoporosis without current pathological fracture: Secondary | ICD-10-CM | POA: Diagnosis present

## 2012-01-30 DIAGNOSIS — Z01818 Encounter for other preprocedural examination: Secondary | ICD-10-CM | POA: Diagnosis not present

## 2012-01-30 DIAGNOSIS — Z800 Family history of malignant neoplasm of digestive organs: Secondary | ICD-10-CM

## 2012-01-30 DIAGNOSIS — M519 Unspecified thoracic, thoracolumbar and lumbosacral intervertebral disc disorder: Secondary | ICD-10-CM | POA: Diagnosis not present

## 2012-01-30 DIAGNOSIS — I10 Essential (primary) hypertension: Secondary | ICD-10-CM | POA: Diagnosis not present

## 2012-01-30 DIAGNOSIS — M545 Low back pain, unspecified: Secondary | ICD-10-CM | POA: Diagnosis not present

## 2012-01-30 DIAGNOSIS — Z888 Allergy status to other drugs, medicaments and biological substances status: Secondary | ICD-10-CM

## 2012-01-30 DIAGNOSIS — M51379 Other intervertebral disc degeneration, lumbosacral region without mention of lumbar back pain or lower extremity pain: Secondary | ICD-10-CM | POA: Diagnosis present

## 2012-01-30 DIAGNOSIS — Z23 Encounter for immunization: Secondary | ICD-10-CM | POA: Diagnosis not present

## 2012-01-30 DIAGNOSIS — M5126 Other intervertebral disc displacement, lumbar region: Principal | ICD-10-CM | POA: Diagnosis present

## 2012-01-30 DIAGNOSIS — Z7901 Long term (current) use of anticoagulants: Secondary | ICD-10-CM

## 2012-01-30 DIAGNOSIS — E785 Hyperlipidemia, unspecified: Secondary | ICD-10-CM

## 2012-01-30 DIAGNOSIS — Z86718 Personal history of other venous thrombosis and embolism: Secondary | ICD-10-CM

## 2012-01-30 DIAGNOSIS — IMO0002 Reserved for concepts with insufficient information to code with codable children: Secondary | ICD-10-CM | POA: Diagnosis not present

## 2012-01-30 DIAGNOSIS — C649 Malignant neoplasm of unspecified kidney, except renal pelvis: Secondary | ICD-10-CM

## 2012-01-30 DIAGNOSIS — R1110 Vomiting, unspecified: Secondary | ICD-10-CM

## 2012-01-30 DIAGNOSIS — M5137 Other intervertebral disc degeneration, lumbosacral region: Secondary | ICD-10-CM | POA: Diagnosis not present

## 2012-01-30 DIAGNOSIS — Z86711 Personal history of pulmonary embolism: Secondary | ICD-10-CM | POA: Diagnosis not present

## 2012-01-30 DIAGNOSIS — K625 Hemorrhage of anus and rectum: Secondary | ICD-10-CM

## 2012-01-30 DIAGNOSIS — I700 Atherosclerosis of aorta: Secondary | ICD-10-CM | POA: Diagnosis not present

## 2012-01-30 HISTORY — DX: Other specified postprocedural states: Z98.890

## 2012-01-30 HISTORY — DX: Unspecified osteoarthritis, unspecified site: M19.90

## 2012-01-30 HISTORY — PX: LUMBAR LAMINECTOMY/DECOMPRESSION MICRODISCECTOMY: SHX5026

## 2012-01-30 HISTORY — DX: Other specified postprocedural states: R11.2

## 2012-01-30 LAB — PROTIME-INR: Prothrombin Time: 13 seconds (ref 11.6–15.2)

## 2012-01-30 LAB — CBC
Hemoglobin: 12.4 g/dL (ref 12.0–15.0)
MCH: 30.4 pg (ref 26.0–34.0)
MCV: 94.6 fL (ref 78.0–100.0)
Platelets: 144 10*3/uL — ABNORMAL LOW (ref 150–400)
RBC: 4.08 MIL/uL (ref 3.87–5.11)
WBC: 4.4 10*3/uL (ref 4.0–10.5)

## 2012-01-30 LAB — BASIC METABOLIC PANEL
CO2: 25 mEq/L (ref 19–32)
Glucose, Bld: 94 mg/dL (ref 70–99)
Potassium: 5.1 mEq/L (ref 3.5–5.1)
Sodium: 138 mEq/L (ref 135–145)

## 2012-01-30 LAB — SURGICAL PCR SCREEN
MRSA, PCR: NEGATIVE
Staphylococcus aureus: POSITIVE — AB

## 2012-01-30 LAB — APTT: aPTT: 26 seconds (ref 24–37)

## 2012-01-30 SURGERY — LUMBAR LAMINECTOMY/DECOMPRESSION MICRODISCECTOMY 1 LEVEL
Anesthesia: General | Site: Back | Laterality: Left | Wound class: Clean

## 2012-01-30 MED ORDER — LACTATED RINGERS IV SOLN
INTRAVENOUS | Status: DC | PRN
  Administered 2012-01-30 (×2): via INTRAVENOUS

## 2012-01-30 MED ORDER — ZOLPIDEM TARTRATE 5 MG PO TABS: 5.0000 mg | ORAL_TABLET | Freq: Every evening | ORAL | Status: DC | PRN

## 2012-01-30 MED ORDER — ACETAMINOPHEN 325 MG PO TABS
650.0000 mg | ORAL_TABLET | ORAL | Status: DC | PRN
  Administered 2012-02-01: 650 mg via ORAL
  Filled 2012-01-30: qty 2

## 2012-01-30 MED ORDER — SODIUM CHLORIDE 0.9 % IV SOLN
INTRAVENOUS | Status: DC
  Administered 2012-01-30: 23:00:00 via INTRAVENOUS

## 2012-01-30 MED ORDER — FENTANYL CITRATE 0.05 MG/ML IJ SOLN
INTRAMUSCULAR | Status: DC | PRN
  Administered 2012-01-30: 100 ug via INTRAVENOUS

## 2012-01-30 MED ORDER — LISINOPRIL-HYDROCHLOROTHIAZIDE 20-12.5 MG PO TABS: 1.0000 | ORAL_TABLET | Freq: Every day | ORAL | Status: DC

## 2012-01-30 MED ORDER — ARTIFICIAL TEARS OP OINT
TOPICAL_OINTMENT | OPHTHALMIC | Status: DC | PRN
  Administered 2012-01-30: 1 via OPHTHALMIC

## 2012-01-30 MED ORDER — THROMBIN 5000 UNITS EX SOLR
CUTANEOUS | Status: DC | PRN
  Administered 2012-01-30 (×2): 5000 [IU] via TOPICAL

## 2012-01-30 MED ORDER — ONDANSETRON HCL 4 MG/2ML IJ SOLN: 4.0000 mg | INTRAMUSCULAR | Status: DC | PRN

## 2012-01-30 MED ORDER — ROCURONIUM BROMIDE 100 MG/10ML IV SOLN
INTRAVENOUS | Status: DC | PRN
  Administered 2012-01-30: 40 mg via INTRAVENOUS

## 2012-01-30 MED ORDER — CEFAZOLIN SODIUM-DEXTROSE 2-3 GM-% IV SOLR
INTRAVENOUS | Status: AC
  Filled 2012-01-30: qty 50

## 2012-01-30 MED ORDER — GLYCOPYRROLATE 0.2 MG/ML IJ SOLN
INTRAMUSCULAR | Status: DC | PRN
  Administered 2012-01-30: .8 mg via INTRAVENOUS

## 2012-01-30 MED ORDER — GABAPENTIN 300 MG PO CAPS
300.0000 mg | ORAL_CAPSULE | Freq: Two times a day (BID) | ORAL | Status: DC
  Administered 2012-01-31 – 2012-02-01 (×3): 300 mg via ORAL
  Filled 2012-01-30 (×5): qty 1

## 2012-01-30 MED ORDER — LISINOPRIL 20 MG PO TABS
20.0000 mg | ORAL_TABLET | Freq: Every day | ORAL | Status: DC
  Administered 2012-01-31 – 2012-02-01 (×2): 20 mg via ORAL
  Filled 2012-01-30 (×2): qty 1

## 2012-01-30 MED ORDER — ONDANSETRON HCL 4 MG/2ML IJ SOLN: 4.0000 mg | Freq: Once | INTRAMUSCULAR | Status: DC | PRN

## 2012-01-30 MED ORDER — MENTHOL 3 MG MT LOZG: 1.0000 | LOZENGE | OROMUCOSAL | Status: DC | PRN

## 2012-01-30 MED ORDER — PROPOFOL 10 MG/ML IV BOLUS
INTRAVENOUS | Status: DC | PRN
  Administered 2012-01-30: 150 mg via INTRAVENOUS

## 2012-01-30 MED ORDER — FENTANYL CITRATE 0.05 MG/ML IJ SOLN
INTRAMUSCULAR | Status: DC | PRN
  Administered 2012-01-30: 50 ug via INTRAVENOUS
  Administered 2012-01-30: 150 ug via INTRAVENOUS

## 2012-01-30 MED ORDER — FENTANYL CITRATE 0.05 MG/ML IJ SOLN
INTRAMUSCULAR | Status: AC
  Filled 2012-01-30: qty 2

## 2012-01-30 MED ORDER — MUPIROCIN 2 % EX OINT
TOPICAL_OINTMENT | Freq: Once | CUTANEOUS | Status: DC
  Filled 2012-01-30: qty 22

## 2012-01-30 MED ORDER — DIAZEPAM 5 MG PO TABS: 5.0000 mg | ORAL_TABLET | Freq: Four times a day (QID) | ORAL | Status: DC | PRN

## 2012-01-30 MED ORDER — PHENYLEPHRINE HCL 10 MG/ML IJ SOLN
INTRAMUSCULAR | Status: DC | PRN
  Administered 2012-01-30 (×7): 80 ug via INTRAVENOUS

## 2012-01-30 MED ORDER — METHYLPREDNISOLONE ACETATE 80 MG/ML IJ SUSP
INTRAMUSCULAR | Status: DC | PRN
  Administered 2012-01-30: 80 mg via INTRA_ARTICULAR

## 2012-01-30 MED ORDER — ONDANSETRON HCL 4 MG/2ML IJ SOLN
INTRAMUSCULAR | Status: DC | PRN
  Administered 2012-01-30: 4 mg via INTRAVENOUS

## 2012-01-30 MED ORDER — HYDROMORPHONE HCL PF 1 MG/ML IJ SOLN
0.5000 mg | INTRAMUSCULAR | Status: DC | PRN
  Administered 2012-01-31 – 2012-02-01 (×3): 0.5 mg via INTRAVENOUS
  Filled 2012-01-30 (×3): qty 1

## 2012-01-30 MED ORDER — CEFAZOLIN SODIUM 1-5 GM-% IV SOLN
1.0000 g | Freq: Three times a day (TID) | INTRAVENOUS | Status: AC
  Administered 2012-01-30 – 2012-01-31 (×2): 1 g via INTRAVENOUS
  Filled 2012-01-30 (×2): qty 50

## 2012-01-30 MED ORDER — CEFAZOLIN SODIUM-DEXTROSE 2-3 GM-% IV SOLR
2.0000 g | INTRAVENOUS | Status: AC
  Administered 2012-01-30: 2 g via INTRAVENOUS

## 2012-01-30 MED ORDER — ACETAMINOPHEN 650 MG RE SUPP: 650.0000 mg | RECTAL | Status: DC | PRN

## 2012-01-30 MED ORDER — 0.9 % SODIUM CHLORIDE (POUR BTL) OPTIME
TOPICAL | Status: DC | PRN
  Administered 2012-01-30: 1000 mL

## 2012-01-30 MED ORDER — MUPIROCIN 2 % EX OINT
TOPICAL_OINTMENT | CUTANEOUS | Status: AC
  Administered 2012-01-30: 1 via NASAL
  Filled 2012-01-30: qty 22

## 2012-01-30 MED ORDER — INFLUENZA VIRUS VACC SPLIT PF IM SUSP: 0.5000 mL | INTRAMUSCULAR | Status: DC | PRN

## 2012-01-30 MED ORDER — PHENOL 1.4 % MT LIQD: 1.0000 | OROMUCOSAL | Status: DC | PRN

## 2012-01-30 MED ORDER — NEOSTIGMINE METHYLSULFATE 1 MG/ML IJ SOLN
INTRAMUSCULAR | Status: DC | PRN
  Administered 2012-01-30: 5 mg via INTRAVENOUS

## 2012-01-30 MED ORDER — LIDOCAINE HCL (CARDIAC) 20 MG/ML IV SOLN
INTRAVENOUS | Status: DC | PRN
  Administered 2012-01-30: 100 mg via INTRAVENOUS

## 2012-01-30 MED ORDER — HYDROCHLOROTHIAZIDE 12.5 MG PO CAPS
12.5000 mg | ORAL_CAPSULE | Freq: Every day | ORAL | Status: DC
  Administered 2012-01-31 – 2012-02-01 (×2): 12.5 mg via ORAL
  Filled 2012-01-30 (×2): qty 1

## 2012-01-30 MED ORDER — SODIUM CHLORIDE 0.9 % IJ SOLN: 3.0000 mL | INTRAMUSCULAR | Status: DC | PRN

## 2012-01-30 MED ORDER — SODIUM CHLORIDE 0.9 % IV SOLN: 250.0000 mL | INTRAVENOUS | Status: DC

## 2012-01-30 MED ORDER — HEMOSTATIC AGENTS (NO CHARGE) OPTIME
TOPICAL | Status: DC | PRN
  Administered 2012-01-30: 1 via TOPICAL

## 2012-01-30 MED ORDER — SODIUM CHLORIDE 0.9 % IJ SOLN
3.0000 mL | Freq: Two times a day (BID) | INTRAMUSCULAR | Status: DC
  Administered 2012-01-31: 3 mL via INTRAVENOUS

## 2012-01-30 MED ORDER — EPHEDRINE SULFATE 50 MG/ML IJ SOLN
INTRAMUSCULAR | Status: DC | PRN
  Administered 2012-01-30 (×6): 10 mg via INTRAVENOUS

## 2012-01-30 MED ORDER — MIDAZOLAM HCL 5 MG/5ML IJ SOLN
INTRAMUSCULAR | Status: DC | PRN
  Administered 2012-01-30: 2 mg via INTRAVENOUS
  Administered 2012-01-30: 1 mg via INTRAVENOUS

## 2012-01-30 MED ORDER — HYDROMORPHONE HCL PF 1 MG/ML IJ SOLN: 0.2500 mg | INTRAMUSCULAR | Status: DC | PRN

## 2012-01-30 SURGICAL SUPPLY — 55 items
BENZOIN TINCTURE PRP APPL 2/3 (GAUZE/BANDAGES/DRESSINGS) ×2 IMPLANT
BLADE SURG ROTATE 9660 (MISCELLANEOUS) IMPLANT
BUR ACORN 6.0 (BURR) ×2 IMPLANT
BUR MATCHSTICK NEURO 3.0 LAGG (BURR) ×2 IMPLANT
CANISTER SUCTION 2500CC (MISCELLANEOUS) ×2 IMPLANT
CLOTH BEACON ORANGE TIMEOUT ST (SAFETY) ×2 IMPLANT
CONT SPEC 4OZ CLIKSEAL STRL BL (MISCELLANEOUS) ×2 IMPLANT
DRAPE LAPAROTOMY 100X72X124 (DRAPES) ×2 IMPLANT
DRAPE MICROSCOPE LEICA (MISCELLANEOUS) ×2 IMPLANT
DRAPE POUCH INSTRU U-SHP 10X18 (DRAPES) ×2 IMPLANT
DRSG PAD ABDOMINAL 8X10 ST (GAUZE/BANDAGES/DRESSINGS) IMPLANT
DURAPREP 26ML APPLICATOR (WOUND CARE) ×2 IMPLANT
ELECT REM PT RETURN 9FT ADLT (ELECTROSURGICAL) ×2
ELECTRODE REM PT RTRN 9FT ADLT (ELECTROSURGICAL) ×1 IMPLANT
GAUZE SPONGE 4X4 16PLY XRAY LF (GAUZE/BANDAGES/DRESSINGS) IMPLANT
GLOVE BIO SURGEON STRL SZ 6.5 (GLOVE) ×4 IMPLANT
GLOVE BIOGEL M 8.0 STRL (GLOVE) ×2 IMPLANT
GLOVE BIOGEL PI IND STRL 6.5 (GLOVE) ×1 IMPLANT
GLOVE BIOGEL PI IND STRL 7.5 (GLOVE) ×1 IMPLANT
GLOVE BIOGEL PI INDICATOR 6.5 (GLOVE) ×1
GLOVE BIOGEL PI INDICATOR 7.5 (GLOVE) ×1
GLOVE ECLIPSE 7.5 STRL STRAW (GLOVE) ×4 IMPLANT
GLOVE EXAM NITRILE LRG STRL (GLOVE) IMPLANT
GLOVE EXAM NITRILE MD LF STRL (GLOVE) IMPLANT
GLOVE EXAM NITRILE XL STR (GLOVE) IMPLANT
GLOVE EXAM NITRILE XS STR PU (GLOVE) IMPLANT
GOWN BRE IMP SLV AUR LG STRL (GOWN DISPOSABLE) ×8 IMPLANT
GOWN BRE IMP SLV AUR XL STRL (GOWN DISPOSABLE) IMPLANT
GOWN STRL REIN 2XL LVL4 (GOWN DISPOSABLE) IMPLANT
KIT BASIN OR (CUSTOM PROCEDURE TRAY) ×2 IMPLANT
KIT ROOM TURNOVER OR (KITS) ×2 IMPLANT
NEEDLE HYPO 18GX1.5 BLUNT FILL (NEEDLE) IMPLANT
NEEDLE HYPO 21X1.5 SAFETY (NEEDLE) ×2 IMPLANT
NEEDLE HYPO 25X1 1.5 SAFETY (NEEDLE) ×2 IMPLANT
NEEDLE SPNL 20GX3.5 QUINCKE YW (NEEDLE) ×2 IMPLANT
NS IRRIG 1000ML POUR BTL (IV SOLUTION) ×2 IMPLANT
PACK LAMINECTOMY NEURO (CUSTOM PROCEDURE TRAY) ×2 IMPLANT
PAD ARMBOARD 7.5X6 YLW CONV (MISCELLANEOUS) ×6 IMPLANT
PATTIES SURGICAL .5 X1 (DISPOSABLE) ×2 IMPLANT
RUBBERBAND STERILE (MISCELLANEOUS) ×4 IMPLANT
SPONGE GAUZE 4X4 12PLY (GAUZE/BANDAGES/DRESSINGS) ×2 IMPLANT
SPONGE LAP 4X18 X RAY DECT (DISPOSABLE) IMPLANT
SPONGE SURGIFOAM ABS GEL SZ50 (HEMOSTASIS) ×2 IMPLANT
STRIP CLOSURE SKIN 1/2X4 (GAUZE/BANDAGES/DRESSINGS) ×2 IMPLANT
SUT VIC AB 0 CT1 18XCR BRD8 (SUTURE) ×1 IMPLANT
SUT VIC AB 0 CT1 8-18 (SUTURE) ×1
SUT VIC AB 2-0 CP2 18 (SUTURE) ×2 IMPLANT
SUT VIC AB 3-0 SH 8-18 (SUTURE) ×2 IMPLANT
SYR 20CC LL (SYRINGE) ×2 IMPLANT
SYR 20ML ECCENTRIC (SYRINGE) ×2 IMPLANT
SYR 5ML LL (SYRINGE) ×2 IMPLANT
TAPE CLOTH SURG 4X10 WHT LF (GAUZE/BANDAGES/DRESSINGS) ×2 IMPLANT
TOWEL OR 17X24 6PK STRL BLUE (TOWEL DISPOSABLE) ×2 IMPLANT
TOWEL OR 17X26 10 PK STRL BLUE (TOWEL DISPOSABLE) ×2 IMPLANT
WATER STERILE IRR 1000ML POUR (IV SOLUTION) ×2 IMPLANT

## 2012-01-30 NOTE — Transfer of Care (Signed)
Immediate Anesthesia Transfer of Care Note  Patient: Lisa Lucas  Procedure(s) Performed: Procedure(s) (LRB) with comments: LUMBAR LAMINECTOMY/DECOMPRESSION MICRODISCECTOMY 1 LEVEL (Left) - Left Lumbar Four-Five Diskectomy  Patient Location: PACU  Anesthesia Type:General  Level of Consciousness: responds to stimulation and Patient remains intubated per anesthesia plan  Airway & Oxygen Therapy: Patient remains intubated per anesthesia plan and Patient placed on Ventilator (see vital sign flow sheet for setting)  Post-op Assessment: Report given to PACU RN and Post -op Vital signs reviewed and stable  Post vital signs: Reviewed and stable  Complications: adverse drug reaction, exaggerated response to Rocuronium.

## 2012-01-30 NOTE — H&P (Signed)
Lisa Lucas is an 76 y.o. female.   Chief Complaint: lbp HPI: 3 moths history of lbp with radiation to the left leg,no better with conservative treatment.mri lumbar spine done  Past Medical History  Diagnosis Date  . History of kidney cancer   . Hypertension   . DVT (deep venous thrombosis) 12/2010    right lower extremity and PE but also had prior DVT and had been on coumadin but converted to plavix/asa due to "reaction" until she presented with another DVT/PE in 12/2010.  Marland Kitchen Hypercholesteremia   . Osteoporosis   . Pulmonary embolism 12/2010  . Spinal stenosis   . Arthritis   . PONV (postoperative nausea and vomiting)     Past Surgical History  Procedure Date  . Abdominal hysterectomy   . Hernia repair   . Appendectomy   . Kidney surgery 2010    bilateral renal cancer s/p bilateral partial nephrectomy  . Leg surgery     right leg for blood clot  . Colonoscopy 12/29/2011    Procedure: COLONOSCOPY;  Surgeon: Danie Binder, MD;  Location: [facility];  Service: Endoscopy;  Laterality: N/A;  11:30    Family History  Problem Relation Age of Onset  . Colon cancer Daughter     age 26s  . Colon cancer Brother     age 60   Social History:  reports that she quit smoking about 13 months ago. She does not have any smokeless tobacco history on file. She reports that she does not drink alcohol or use illicit drugs.  Allergies:  Allergies  Allergen Reactions  . Hydrocodone Nausea Only  . Oxycodone Nausea Only    Medications Prior to Admission  Medication Sig Dispense Refill  . Calcium Carbonate-Vitamin D (CALCIUM 600 + D PO) Take 1 tablet by mouth daily.       Marland Kitchen gabapentin (NEURONTIN) 300 MG capsule Take 1 capsule by mouth Three times a day.      Marland Kitchen HYDROmorphone (DILAUDID) 2 MG tablet Take 2 mg by mouth 2 (two) times daily as needed. For pain      . lisinopril-hydrochlorothiazide (PRINZIDE,ZESTORETIC) 20-12.5 MG per tablet Take 1 tablet by mouth daily.       . Magnesium  250 MG TABS Take 1 tablet by mouth daily.      Marland Kitchen warfarin (COUMADIN) 5 MG tablet Take 5 mg by mouth daily. 1/2 tab. Sunday & Tuesday, all other days a whole tablet      . acetaminophen (TYLENOL) 500 MG tablet Take 1,500 mg by mouth every 6 (six) hours as needed. For pain      . ondansetron (ZOFRAN) 4 MG tablet Take 4 mg by mouth every 8 (eight) hours as needed. nausea        Results for orders placed during the hospital encounter of 01/30/12 (from the past 48 hour(s))  SURGICAL PCR SCREEN     Status: Abnormal   Collection Time   01/30/12 12:18 PM      Component Value Range Comment   MRSA, PCR NEGATIVE  NEGATIVE    Staphylococcus aureus POSITIVE (*) NEGATIVE   BASIC METABOLIC PANEL     Status: Abnormal   Collection Time   01/30/12  2:14 PM      Component Value Range Comment   Sodium 138  135 - 145 mEq/L    Potassium 5.1  3.5 - 5.1 mEq/L    Chloride 104  96 - 112 mEq/L    CO2 25  19 -  32 mEq/L    Glucose, Bld 94  70 - 99 mg/dL    BUN 38 (*) 6 - 23 mg/dL    Creatinine, Ser 1.75 (*) 0.50 - 1.10 mg/dL    Calcium 9.7  8.4 - 10.5 mg/dL    GFR calc non Af Amer 28 (*) >90 mL/min    GFR calc Af Amer 32 (*) >90 mL/min   CBC     Status: Abnormal   Collection Time   01/30/12  2:14 PM      Component Value Range Comment   WBC 4.4  4.0 - 10.5 K/uL    RBC 4.08  3.87 - 5.11 MIL/uL    Hemoglobin 12.4  12.0 - 15.0 g/dL    HCT 38.6  36.0 - 46.0 %    MCV 94.6  78.0 - 100.0 fL    MCH 30.4  26.0 - 34.0 pg    MCHC 32.1  30.0 - 36.0 g/dL    RDW 13.8  11.5 - 15.5 %    Platelets 144 (*) 150 - 400 K/uL   PROTIME-INR     Status: Normal   Collection Time   01/30/12  2:14 PM      Component Value Range Comment   Prothrombin Time 13.0  11.6 - 15.2 seconds    INR 0.99  0.00 - 1.49   APTT     Status: Normal   Collection Time   01/30/12  2:14 PM      Component Value Range Comment   aPTT 26  24 - 37 seconds    Dg Chest 2 View  01/30/2012  *RADIOLOGY REPORT*  Clinical Data: Preop for lumbar  laminectomy  CHEST - 2 VIEW  Comparison: 01/11/2011  Findings: Cardiomediastinal silhouette is stable.  No acute infiltrate or pleural effusion.  No pulmonary edema.  Mild atherosclerotic calcifications of thoracic aorta.  Bony thorax is stable.  IMPRESSION: No active disease.   Original Report Authenticated By: Lahoma Crocker, M.D.     Review of Systems  Constitutional: Negative.   HENT: Positive for tinnitus.   Eyes: Negative.   Respiratory: Positive for shortness of breath.   Cardiovascular:       Arterial hypertension  Gastrointestinal:       Hiatal hernia  Genitourinary: Negative.   Musculoskeletal: Positive for back pain.  Skin: Negative.   Neurological: Positive for sensory change and focal weakness.  Endo/Heme/Allergies: Negative.   Psychiatric/Behavioral: Negative.     Blood pressure 138/83, pulse 69, temperature 97.8 F (36.6 C), temperature source Oral, resp. rate 18, height 5\' 5"  (1.651 m), weight 83.462 kg (184 lb), SpO2 96.00%. Physical Exam hent, nl. Neck, nl cv,nl, lund some ronchii bilaterally. Abdomen, soft. Extremities, nl. NEURO LEFT leg 2/5 dorsiflexion left foot. Dtr, nl. Sensory tingling lef foot dorsally. MRI lumbar spine showed multiple levels of ddd , stenosis and at l4-5  Left a large hnp displacing the thecal sac.  Assessment/Plan Left l4-5 discectomy.patient aware of risks and benefits.  Conner Neiss M 01/30/2012, 5:58 PM

## 2012-01-30 NOTE — Anesthesia Procedure Notes (Signed)
Procedure Name: Intubation Date/Time: 01/30/2012 7:05 PM Performed by: Ned Grace Pre-anesthesia Checklist: Patient identified, Timeout performed, Emergency Drugs available, Suction available and Patient being monitored Patient Re-evaluated:Patient Re-evaluated prior to inductionOxygen Delivery Method: Circle system utilized Preoxygenation: Pre-oxygenation with 100% oxygen Intubation Type: IV induction Ventilation: Mask ventilation without difficulty Laryngoscope Size: Mac and 3 Grade View: Grade I Tube type: Oral Tube size: 7.5 mm Number of attempts: 1 Airway Equipment and Method: Stylet Placement Confirmation: ETT inserted through vocal cords under direct vision,  breath sounds checked- equal and bilateral and positive ETCO2 Secured at: 23 cm Tube secured with: Tape Dental Injury: Teeth and Oropharynx as per pre-operative assessment

## 2012-01-30 NOTE — Procedures (Signed)
Extubation Procedure Note  Patient Details:   Name: ARLYN YOKE DOB: September 02, 1935 MRN: [id number]   Airway Documentation:     Evaluation  O2 sats: stable throughout Complications: No apparent complications Patient did tolerate procedure well. Bilateral Breath Sounds: Clear   Yes extubated and able to vocalize words. Positive air leak prior to extubating. Placed on 3l [place] and sats 99% HR 86 BP good. RR 15.   Clare Gandy, Chales Abrahams 01/30/2012, 9:20 PM

## 2012-01-30 NOTE — Anesthesia Postprocedure Evaluation (Signed)
Anesthesia Post Note  Patient: Lisa Lucas  Procedure(s) Performed: Procedure(s) (LRB): LUMBAR LAMINECTOMY/DECOMPRESSION MICRODISCECTOMY 1 LEVEL (Left)  Anesthesia type: general  Patient location: PACU  Post pain: Pain level controlled  Post assessment: Patient's Cardiovascular Status Stable  Last Vitals:  Filed Vitals:   01/30/12 2135  BP: 133/68  Pulse:   Temp: 36.1 C  Resp:     Post vital signs: Reviewed and stable  Level of consciousness: sedated  Complications: No apparent anesthesia complications

## 2012-01-30 NOTE — Progress Notes (Signed)
Op note [id number]

## 2012-01-30 NOTE — Anesthesia Preprocedure Evaluation (Signed)
Anesthesia Evaluation  Patient identified by MRN, date of birth, ID band Patient awake    History of Anesthesia Complications (+) PONV  Airway Mallampati: I TM Distance: >3 FB Neck ROM: full    Dental   Pulmonary          Cardiovascular hypertension, Rhythm:regular Rate:Normal     Neuro/Psych    GI/Hepatic   Endo/Other    Renal/GU Renal disease     Musculoskeletal   Abdominal   Peds  Hematology   Anesthesia Other Findings   Reproductive/Obstetrics                           Anesthesia Physical Anesthesia Plan  ASA: II  Anesthesia Plan: General   Post-op Pain Management:    Induction: Intravenous  Airway Management Planned: Oral ETT  Additional Equipment:   Intra-op Plan:   Post-operative Plan: Extubation in OR  Informed Consent: I have reviewed the patients History and Physical, chart, labs and discussed the procedure including the risks, benefits and alternatives for the proposed anesthesia with the patient or authorized representative who has indicated his/her understanding and acceptance.     Plan Discussed with: CRNA, Anesthesiologist and Surgeon  Anesthesia Plan Comments:         Anesthesia Quick Evaluation

## 2012-01-30 NOTE — Preoperative (Signed)
Beta Blockers   Reason not to administer Beta Blockers:Not Applicable 

## 2012-01-31 ENCOUNTER — Encounter (HOSPITAL_COMMUNITY): Payer: Self-pay | Admitting: Neurosurgery

## 2012-01-31 NOTE — Evaluation (Signed)
Occupational Therapy Evaluation Patient Details Name: Lisa Lucas MRN: [id number] DOB: [birth year] Today's Date: 01/31/2012 Time: [id number] OT Time Calculation (min): 24 min  OT Assessment / Plan / Recommendation Clinical Impression  Pt is recovering from lumbar laminectomy/decompression/diskectomy.  Pt limited somewhat by pain.  Moving well for post op day one.  Will follow to instruct in ADL following back precautions.    OT Assessment  Patient needs continued OT Services    Follow Up Recommendations  Home health OT    Barriers to Discharge      Equipment Recommendations  Rolling walker with 5" wheels;3 in 1 bedside comode    Recommendations for Other Services    Frequency  Min 2X/week    Precautions / Restrictions Precautions Precautions: Fall;Back Precaution Booklet Issued: Yes (comment) Precaution Comments: pt with verbal confirmation of understanding Restrictions Weight Bearing Restrictions: No   Pertinent Vitals/Pain 5/10 back pain reported to nurse, repositioned    ADL  Eating/Feeding: Simulated;Independent Where Assessed - Eating/Feeding: Chair Grooming: Simulated;Min guard Where Assessed - Grooming: Unsupported standing Upper Body Bathing: Simulated;Minimal assistance Where Assessed - Upper Body Bathing: Unsupported sitting Lower Body Bathing: Simulated;Maximal assistance Where Assessed - Lower Body Bathing: Unsupported sitting;Supported sit to stand Upper Body Dressing: Performed;Set up Where Assessed - Upper Body Dressing: Unsupported sitting Lower Body Dressing: Performed;Maximal assistance Where Assessed - Lower Body Dressing: Unsupported sitting;Supported sit to stand Toilet Transfer: Simulated;Min guard Toilet Transfer Method: Sit to stand Equipment Used: Rolling walker;Gait belt Transfers/Ambulation Related to ADLs: min guard assist for ambulation with RW. ADL Comments: Instructed pt in back precautions related to LB ADL.    OT Diagnosis:  Generalized weakness;Acute pain  OT Problem List: Decreased activity tolerance;Impaired balance (sitting and/or standing);Decreased knowledge of use of DME or AE;Decreased knowledge of precautions;Pain OT Treatment Interventions: Self-care/ADL training;DME and/or AE instruction;Patient/family education   OT Goals Acute Rehab OT Goals OT Goal Formulation: With patient Time For Goal Achievement: 02/07/12 Potential to Achieve Goals: Good ADL Goals Pt Will Perform Lower Body Bathing: with modified independence;Sit to stand from chair;Sitting at sink;with adaptive equipment ADL Goal: Lower Body Bathing - Progress: Goal set today Pt Will Perform Lower Body Dressing: with modified independence;Sit to stand from chair;with adaptive equipment ADL Goal: Lower Body Dressing - Progress: Goal set today Pt Will Transfer to Toilet: with modified independence;Comfort height toilet;Ambulation;Maintaining back safety precautions ADL Goal: Toilet Transfer - Progress: Goal set today Pt Will Perform Toileting - Clothing Manipulation: with modified independence;Standing ADL Goal: Toileting - Clothing Manipulation - Progress: Goal set today Pt Will Perform Toileting - Hygiene: with modified independence;Sit to stand from 3-in-1/toilet ADL Goal: Toileting - Hygiene - Progress: Goal set today Pt Will Perform Tub/Shower Transfer: Shower transfer;Ambulation;with DME;Maintaining back safety precautions;with supervision (onto 3 in 1) ADL Goal: Tub/Shower Transfer - Progress: Goal set today Miscellaneous OT Goals Miscellaneous OT Goal #1: Pt will generalize back precautions in ADL independently. OT Goal: Miscellaneous Goal #1 - Progress: Goal set today  Visit Information  Last OT Received On: 01/31/12 Assistance Needed: +1 PT/OT Co-Evaluation/Treatment: Yes    Subjective Data  Subjective: "I'm doing pretty good." Patient Stated Goal: Home with assist of family.   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 difficulties Dominant Hand: Right         Vision/Perception     Cognition  Overall Cognitive Status: Appears within functional limits for tasks assessed/performed Arousal/Alertness: Awake/alert Orientation Level: Appears intact for tasks assessed Behavior During Session: [facility] for tasks performed    Extremity/Trunk Assessment Right Upper Extremity Assessment RUE ROM/Strength/Tone: [facility] for tasks assessed Left Upper Extremity Assessment LUE ROM/Strength/Tone: WFL for tasks assessed Right Lower Extremity Assessment RLE ROM/Strength/Tone: Within functional levels Left Lower Extremity Assessment LLE ROM/Strength/Tone: Within functional levels Trunk Assessment Trunk Assessment: Normal     Mobility Bed Mobility Bed Mobility: Rolling Right;Right Sidelying to Sit;Sitting - Scoot to Edge of Bed Rolling Right: 5: Supervision;With rail Right Sidelying to Sit: 4: Min assist;HOB flat;With rails Sitting - Scoot to Edge of Bed: 4: Min guard (increased time) Transfers Sit to Stand: 4: Min guard;With upper extremity assist;From bed Stand to Sit: 4: Min guard;Without upper extremity assist;To chair/3-in-1 Details for Transfer Assistance: v/c's for hand placement, increased time due to pain     Shoulder Instructions     Exercise     Balance     End of Session OT - End of Session Activity Tolerance: Patient tolerated treatment well Patient left: in chair;with call bell/phone  within reach;with nursing in room Nurse Communication: Patient requests pain meds  GO     Malka So 01/31/2012, 10:24 AM [phone]

## 2012-01-31 NOTE — Progress Notes (Signed)
   CARE MANAGEMENT NOTE 01/31/2012  Patient:  Lisa Lucas, Lisa Lucas   Account Number:  [id number]  Date Initiated:  01/31/2012  Documentation initiated by:  [facility]  Subjective/Objective Assessment:   Admitted postop L4-5 laminectomy.     Action/Plan:   PT/OT evals-reommending HHPT, HHOT, rolling walker, 3N1   Anticipated DC Date:  02/03/2012   Anticipated DC Plan:  [place]  CM consult      [facility] Choice  HOME HEALTH   Choice offered to / List presented to:  C-1 Patient   DME arranged  3-N-1  Vassie Moselle      DME agency  [place] arranged  [place]   Status of service:  Completed, signed off Medicare Important Message given?   (If response is "NO", the following Medicare IM given date fields will be blank) Date Medicare IM given:   Date Additional Medicare IM given:    Discharge Disposition:  [place]  Per UR Regulation:  Reviewed for med. necessity/level of care/duration of stay  If discussed at [place] of Stay Meetings, dates discussed:    Comments:  01/31/2012 1500 NCM spoke to pt and provided [facility] list. Pt states she would like agency that will accept her insurance coverage and go to her area for [facility]. She lives at home with dtr, Kingsley Spittle. Dtr works during the day. States she will need a  RW and 3n1 bedside commode at home. NCM notified to [employer] rep to see if they go to [facility] for [facility].  Left message on voice mail. Spoke to [facility], they do go to [place] and can accept referral.  Jonnie Finner RN CCM Case Mgmt phone [phone]

## 2012-01-31 NOTE — Progress Notes (Signed)
Patient ID: Lisa Lucas, female   DOB: 09-20-1935, 76 y.o.   MRN: [id number] Doing well. Strength up to 4/5. No more radiculopathy.continue pt/ot

## 2012-01-31 NOTE — Evaluation (Signed)
Physical Therapy Evaluation Patient Details Name: Lisa Lucas MRN: [id number] DOB: [birth year] Today's Date: 01/31/2012 Time: [id number] PT Time Calculation (min): 17 min  PT Assessment / Plan / Recommendation Clinical Impression  Pt s/p posterior lumbar fusion presenting with 7/10 surgical back pain and generalized decrease in functional mobility at anticpated. Pt tolerated first PT session well. Pt with good home set up and support. ANticipate patient to be safe to return home with 24/7 supervision/assist provided by family and RW/BSC. Pt to con't to benefit from skilled PT to maximize functional recovery.    PT Assessment  Patient needs continued PT services    Follow Up Recommendations  Home health PT;Supervision/Assistance - 24 hour    Does the patient have the potential to tolerate intense rehabilitation      Barriers to Discharge None      Equipment Recommendations  Rolling walker with 5" wheels;3 in 1 bedside comode    Recommendations for Other Services     Frequency Min 5X/week    Precautions / Restrictions Precautions Precautions: Fall;Back Precaution Booklet Issued: Yes (comment) Precaution Comments: pt with verbal confirmation of understanding Restrictions Weight Bearing Restrictions: No   Pertinent Vitals/Pain 7/10 surgical back pain      Mobility  Bed Mobility Bed Mobility: Rolling Right;Right Sidelying to Sit;Sitting - Scoot to Edge of Bed Rolling Right: 5: Supervision;With rail Right Sidelying to Sit: 4: Min assist;HOB flat;With rails Sitting - Scoot to Edge of Bed: 4: Min guard (increased time) Transfers Transfers: Sit to Stand;Stand to Sit Sit to Stand: 4: Min guard;With upper extremity assist;From bed Stand to Sit: 4: Min guard;Without upper extremity assist;To chair/3-in-1 Details for Transfer Assistance: v/c's for hand placement, increased time due to pain Ambulation/Gait Ambulation/Gait Assistance: 4: Min guard Ambulation Distance  (Feet): 25 Feet Assistive device: Rolling walker Ambulation/Gait Assistance Details: pt with report of increased stability and decreased pain with RW compared to without device Gait Pattern: Step-through pattern;Decreased stride length Gait velocity: slow Stairs: No    Shoulder Instructions     Exercises     PT Diagnosis: Difficulty walking  PT Problem List: Decreased strength;Decreased balance;Decreased mobility PT Treatment Interventions: DME instruction;Gait training;Functional mobility training;Therapeutic activities;Therapeutic exercise   PT Goals Acute Rehab PT Goals PT Goal Formulation: With patient Time For Goal Achievement: 02/07/12 Potential to Achieve Goals: Good Pt will Roll Supine to Right Side: Independently PT Goal: Rolling Supine to Right Side - Progress: Goal set today Pt will go Supine/Side to Sit: with modified independence;with HOB 0 degrees PT Goal: Supine/Side to Sit - Progress: Goal set today Pt will go Sit to Supine/Side: with modified independence;with HOB 0 degrees PT Goal: Sit to Supine/Side - Progress: Goal set today Pt will go Sit to Stand: Independently;with upper extremity assist PT Goal: Sit to Stand - Progress: Goal set today Pt will Ambulate: >150 feet;with modified independence;with rolling walker PT Goal: Ambulate - Progress: Goal set today Pt will Go Up / Down Stairs: 3-5 stairs;with min assist (with HHA due to no handrails.) PT Goal: Up/Down Stairs - Progress: Goal set today Additional Goals Additional Goal #1: Pt able to I'ly recall 3/3 back precautions and be 100% compliant. PT Goal: Additional Goal #1 - Progress: Goal set today  Visit Information  Last PT Received On: 01/31/12 Assistance Needed: +1    Subjective Data  Subjective: Pt received supine in bed with RN tech present who reports just assisting pt to bathroom Patient Stated Goal: home   Prior [place] Lives  With: Family Available Help at Discharge:  Family;Available 24 hours/day Type of Home: House Home Access: Stairs to enter [employer] of Steps: 3 Entrance Stairs-Rails: None Home Layout: One level Bathroom Shower/Tub: Multimedia programmer: Standard Bathroom Accessibility: Yes How Accessible: Accessible via walker Home Adaptive Equipment: None Prior Function Level of Independence: Independent Able to Take Stairs?: Yes Driving: Yes Vocation: Retired Corporate investment banker: No difficulties Dominant Hand: Right    Cognition  Overall Cognitive Status: Appears within functional limits for tasks assessed/performed Arousal/Alertness: Awake/alert Orientation Level: Appears intact for tasks assessed Behavior During Session: [facility] for tasks performed    Extremity/Trunk Assessment Right Upper Extremity Assessment RUE ROM/Strength/Tone: Within functional levels Left Upper Extremity Assessment LUE ROM/Strength/Tone: Within functional levels Right Lower Extremity Assessment RLE ROM/Strength/Tone: Within functional levels Left Lower Extremity Assessment LLE ROM/Strength/Tone: Within functional levels Trunk Assessment Trunk Assessment: Normal (for recent back surgery)   Balance    End of Session PT - End of Session Equipment Utilized During Treatment: Gait belt Activity Tolerance: Patient tolerated treatment well Patient left: in chair;with call bell/phone within reach Nurse Communication: Mobility status  GP     Kingsley Callander 01/31/2012, 10:08 AM  Kittie Plater, PT, DPT Pager #: [phone] Office #: [phone]

## 2012-01-31 NOTE — Clinical Social Work Note (Addendum)
Clinical Social Work  CSW received consult for SNF. CSW reviewed chart and discuss pt with RN during progression. PT is recommending HHPT with 24 hour supervision. Family is available to provide supervision. RNCM is aware and following. CSW is signing off as no further needs identified.   Darden Dates, MSW, Latanya Presser [phone]  Addendum: Please reconsult if a need arises prior to discharge.

## 2012-02-01 DIAGNOSIS — M5137 Other intervertebral disc degeneration, lumbosacral region: Secondary | ICD-10-CM | POA: Diagnosis not present

## 2012-02-01 DIAGNOSIS — M5126 Other intervertebral disc displacement, lumbar region: Secondary | ICD-10-CM | POA: Diagnosis not present

## 2012-02-01 DIAGNOSIS — M48061 Spinal stenosis, lumbar region without neurogenic claudication: Secondary | ICD-10-CM | POA: Diagnosis not present

## 2012-02-01 MED ORDER — INFLUENZA VIRUS VACC SPLIT PF IM SUSP
0.5000 mL | Freq: Once | INTRAMUSCULAR | Status: AC
  Administered 2012-02-01: 0.5 mL via INTRAMUSCULAR
  Filled 2012-02-01: qty 0.5

## 2012-02-01 NOTE — Discharge Summary (Signed)
Physician Discharge Summary  Patient ID: Lisa Lucas MRN: [id number] DOB/AGE: 08-12-1935 76 y.o.  Admit date: 01/30/2012 Discharge date: 02/01/2012  Admission Diagnoses:left l45 hnp  Discharge Diagnoses: same Active Problems:  * No active hospital problems. *    Discharged Condition: no radicular pain, strength up to 4.5/5  Hospital Course: surgery  Consults: no  Significant Diagnostic Studies: mri  Treatments:surgery  Discharge Exam: Blood pressure 129/67, pulse 85, temperature 98.1 F (36.7 C), temperature source Oral, resp. rate 18, height 5\' 5"  (1.651 m), weight 83.462 kg (184 lb), SpO2 90.00%. ambulating  Disposition: home on dilaudid. She is allergic to every other analgesics     Medication List     As of 02/01/2012  9:45 AM    ASK your doctor about these medications         acetaminophen 500 MG tablet   Commonly known as: TYLENOL   Take 1,500 mg by mouth every 6 (six) hours as needed. For pain      CALCIUM 600 + D PO   Take 1 tablet by mouth daily.      gabapentin 300 MG capsule   Commonly known as: NEURONTIN   Take 1 capsule by mouth Three times a day.      HYDROmorphone 2 MG tablet   Commonly known as: DILAUDID   Take 2 mg by mouth 2 (two) times daily as needed. For pain      lisinopril-hydrochlorothiazide 20-12.5 MG per tablet   Commonly known as: PRINZIDE,ZESTORETIC   Take 1 tablet by mouth daily.      Magnesium 250 MG Tabs   Take 1 tablet by mouth daily.      ondansetron 4 MG tablet   Commonly known as: ZOFRAN   Take 4 mg by mouth every 8 (eight) hours as needed. nausea      warfarin 5 MG tablet   Commonly known as: COUMADIN   Take 5 mg by mouth daily. 1/2 tab. Sunday & Tuesday, all other days a whole tablet           Follow-up Information    Follow up with [facility]. (Home Health Physical Therapy and Occupational Therapy)    Contact information:   [phone]         Signed: Floyce Stakes 02/01/2012, 9:45 AM

## 2012-02-01 NOTE — Progress Notes (Signed)
Physical Therapy Treatment Patient Details Name: Lisa Lucas MRN: [id number] DOB: Jan 08, 1936 Today's Date: 02/01/2012 Time: [id number] PT Time Calculation (min): 19 min  PT Assessment / Plan / Recommendation Comments on Treatment Session  Pt. presents to be moving well when OOB and ambulating with RW. Pt. began haveing c/o pain in her Lt. posterior hip/buttock that, per pt. report, "comes around to the side and goes down my leg". RN notified of this pain and pt's request for pain medications. Pt. was able to increase her ambulation distance with today's session.    Follow Up Recommendations  Home health PT;Supervision/Assistance - 24 hour     Does the patient have the potential to tolerate intense rehabilitation     Barriers to Discharge        Equipment Recommendations  Rolling walker with 5" wheels;3 in 1 bedside comode    Recommendations for Other Services    Frequency Min 5X/week   Plan      Precautions / Restrictions Precautions Precautions: Fall;Back Precaution Booklet Issued: Yes (comment) Precaution Comments: pt. stated 3/3 back precautions with cuing Restrictions Weight Bearing Restrictions: No   Pertinent Vitals/Pain Patient reports pain 6/10 in the Lt. Posterior hip/buttock that comes around to the side of her Lt. LE and continues downward.    Mobility  Bed Mobility Bed Mobility: Not assessed Transfers Transfers: Sit to Stand Sit to Stand: 5: Supervision Stand to Sit: 5: Supervision;4: Min guard Details for Transfer Assistance: Sit<>stand supervision throughout session, with stand>sit at min guard near end of session due to pt. reported pain in her Lt. posterior hip and on the side of her Lt. LE.  Ambulation/Gait Ambulation/Gait Assistance: 4: Min guard;4: Min Wellsite geologist (Feet): 50 Feet Assistive device: Rolling walker Ambulation/Gait Assistance Details: Pt. began ambulation with min guard for safety and as ambulation progressed, pt.  began reporting of pain in her Lt. posterior hip area and down the side of her Lt. LE. Pt. presented with antalgic gt pattern and given VC to roll the RW and not lift up on the back legs. Pt. stated that [place] through her Lt. LE while clearing the floor with Rt LE became increasingly difficult and min (A) was provided. Pt. returned to room via ambulation. Pt. stated that she had not had any pain medications this morning and RN notified. Gait Pattern: Step-through pattern;Decreased stride length;Antalgic;Narrow base of support Gait velocity: Slow General Gait Details: Pt. encouraged to widen her BOS but stated that made the pain in her Lt. LE increase and she wished to remain in a narrow BOS. Pt. with antalgic gait pattern after rest break and wished to return to her room post sitting break during ambulation. Stairs: No Wheelchair Mobility Wheelchair Mobility: No      PT Goals Acute Rehab PT Goals PT Goal Formulation: With patient Time For Goal Achievement: 02/07/12 Potential to Achieve Goals: Good Pt will Roll Supine to Right Side: Independently Pt will go Supine/Side to Sit: with modified independence;with HOB 0 degrees Pt will go Sit to Supine/Side: with modified independence;with HOB 0 degrees Pt will go Sit to Stand: Independently;with upper extremity assist PT Goal: Sit to Stand - Progress: Progressing toward goal Pt will Ambulate: >150 feet;with modified independence;with rolling walker PT Goal: Ambulate - Progress: Progressing toward goal Pt will Go Up / Down Stairs: 3-5 stairs;with min assist Additional Goals Additional Goal #1: Pt able to I'ly recall 3/3 back precautions and be 100% compliant. PT Goal: Additional Goal #1 - Progress: Progressing toward  goal  Visit Information  Last PT Received On: 02/01/12 Assistance Needed: +1    Subjective Data  Subjective: "I think I am doing alright" Patient Stated Goal: home   Cognition  Overall Cognitive Status: Appears within  functional limits for tasks assessed/performed Arousal/Alertness: Awake/alert Orientation Level: Appears intact for tasks assessed Behavior During Session: [facility] for tasks performed    Balance     End of Session PT - End of Session Equipment Utilized During Treatment: Gait belt Activity Tolerance: Patient tolerated treatment well Patient left: in chair;with call bell/phone within reach Nurse Communication: Mobility status;Patient requests pain meds    Burman Freestone, [place] 02/01/2012, 12:36 PM

## 2012-02-01 NOTE — Op Note (Signed)
NAMEAMAREE, Lisa Lucas             ACCOUNT NO.:  [id number]  MEDICAL RECORD NO.:  [id number]  LOCATION:                                 FACILITY:  PHYSICIAN:  Leeroy Cha, M.D.   DATE OF BIRTH:  August 18, 1935  DATE OF PROCEDURE:  01/30/2012 DATE OF DISCHARGE:                              OPERATIVE REPORT   PREOPERATIVE DIAGNOSES:  Left L4-L5 herniated disk with _2/5_________ weakness of dorsiflexion of left foot.  Degenerative disk disease, stenosis.  POSTOPERATIVE DIAGNOSES:  Left L4-L5 herniated disk with ___2/5_______ weakness of dorsiflexion of left foot.  Degenerative disk disease, stenosis.  PROCEDURE:  Left L4-5 laminotomy, diskectomy, decompression of the thecal sac and the L4-L5 nerve root.  Microscope.  SURGEON:  Leeroy Cha, MD  ASSISTANT:  Dr. Luiz Ochoa.  CLINICAL HISTORY:  The patient is being admitted because of back and left leg pain associated with __2/5________ weakness of dorsiflexion of the left foot.  MRI showed a large herniated disk at L4-L5 to the left with displacement of the thecal sac.  She also has multiple level degenerative disk disease.  The patient's family knew the risk with the surgery as well as the benefits.  DETAILS OF PROCEDURE:  The patient was taken to the OR, and after intubation, she was positioned in a prone manner.  The back was cleaned with DuraPrep.  We were able to fill the L4-L5 space interlaminar and incision was done in the midline.  The fascia was retracted laterally. X-rays showed that indeed we were at L4-L5.  Then with the help of the microscope, we drilled the upper lamina of L4 and the lower of L5.  A thick calcified ligament was removed.  We identified the thecal sac. Indeed the L5 nerve root was really attached to the underneath herniated disk.  Lysis was accomplished.  We did an incision in the disk and both components of soft and hard calcified disk were removed.  At the end, we had a good decompression of the  thecal sac as well as the L4 and L5 nerve root.  Then the area was irrigated.  Valsalva maneuver was negative.  Then, fentanyl and Depo-Medrol were left in the epidural space and the wound was closed with Vicryl and Steri-Strips.          ______________________________ Leeroy Cha, M.D.     EB/MEDQ  D:  01/30/2012  T:  01/31/2012  Job:  [id number]

## 2012-02-01 NOTE — Progress Notes (Signed)
Patient D/C instructions and patient education given to patient. All questions answered to patient's satisfaction. Pt D/C home with no signs of acute distress.

## 2012-02-02 DIAGNOSIS — Z4789 Encounter for other orthopedic aftercare: Secondary | ICD-10-CM | POA: Diagnosis not present

## 2012-02-02 DIAGNOSIS — M48061 Spinal stenosis, lumbar region without neurogenic claudication: Secondary | ICD-10-CM | POA: Diagnosis not present

## 2012-02-02 DIAGNOSIS — I10 Essential (primary) hypertension: Secondary | ICD-10-CM | POA: Diagnosis not present

## 2012-02-02 DIAGNOSIS — R269 Unspecified abnormalities of gait and mobility: Secondary | ICD-10-CM | POA: Diagnosis not present

## 2012-02-02 DIAGNOSIS — Z86718 Personal history of other venous thrombosis and embolism: Secondary | ICD-10-CM | POA: Diagnosis not present

## 2012-02-02 DIAGNOSIS — IMO0001 Reserved for inherently not codable concepts without codable children: Secondary | ICD-10-CM | POA: Diagnosis not present

## 2012-02-02 DIAGNOSIS — M6281 Muscle weakness (generalized): Secondary | ICD-10-CM | POA: Diagnosis not present

## 2012-02-02 DIAGNOSIS — Z85528 Personal history of other malignant neoplasm of kidney: Secondary | ICD-10-CM | POA: Diagnosis not present

## 2012-02-02 DIAGNOSIS — I2510 Atherosclerotic heart disease of native coronary artery without angina pectoris: Secondary | ICD-10-CM | POA: Diagnosis not present

## 2012-02-02 DIAGNOSIS — M1990 Unspecified osteoarthritis, unspecified site: Secondary | ICD-10-CM | POA: Diagnosis not present

## 2012-02-05 DIAGNOSIS — IMO0001 Reserved for inherently not codable concepts without codable children: Secondary | ICD-10-CM | POA: Diagnosis not present

## 2012-02-05 DIAGNOSIS — I2510 Atherosclerotic heart disease of native coronary artery without angina pectoris: Secondary | ICD-10-CM | POA: Diagnosis not present

## 2012-02-05 DIAGNOSIS — Z4789 Encounter for other orthopedic aftercare: Secondary | ICD-10-CM | POA: Diagnosis not present

## 2012-02-05 DIAGNOSIS — R269 Unspecified abnormalities of gait and mobility: Secondary | ICD-10-CM | POA: Diagnosis not present

## 2012-02-05 DIAGNOSIS — M6281 Muscle weakness (generalized): Secondary | ICD-10-CM | POA: Diagnosis not present

## 2012-02-05 DIAGNOSIS — M48061 Spinal stenosis, lumbar region without neurogenic claudication: Secondary | ICD-10-CM | POA: Diagnosis not present

## 2012-02-05 NOTE — Progress Notes (Signed)
Lisa Lucas, PTA [phone]

## 2012-02-09 DIAGNOSIS — I2510 Atherosclerotic heart disease of native coronary artery without angina pectoris: Secondary | ICD-10-CM | POA: Diagnosis not present

## 2012-02-09 DIAGNOSIS — M6281 Muscle weakness (generalized): Secondary | ICD-10-CM | POA: Diagnosis not present

## 2012-02-09 DIAGNOSIS — Z4789 Encounter for other orthopedic aftercare: Secondary | ICD-10-CM | POA: Diagnosis not present

## 2012-02-09 DIAGNOSIS — R269 Unspecified abnormalities of gait and mobility: Secondary | ICD-10-CM | POA: Diagnosis not present

## 2012-02-09 DIAGNOSIS — M48061 Spinal stenosis, lumbar region without neurogenic claudication: Secondary | ICD-10-CM | POA: Diagnosis not present

## 2012-02-09 DIAGNOSIS — IMO0001 Reserved for inherently not codable concepts without codable children: Secondary | ICD-10-CM | POA: Diagnosis not present

## 2012-02-12 DIAGNOSIS — M6281 Muscle weakness (generalized): Secondary | ICD-10-CM | POA: Diagnosis not present

## 2012-02-12 DIAGNOSIS — IMO0001 Reserved for inherently not codable concepts without codable children: Secondary | ICD-10-CM | POA: Diagnosis not present

## 2012-02-12 DIAGNOSIS — I2510 Atherosclerotic heart disease of native coronary artery without angina pectoris: Secondary | ICD-10-CM | POA: Diagnosis not present

## 2012-02-12 DIAGNOSIS — Z4789 Encounter for other orthopedic aftercare: Secondary | ICD-10-CM | POA: Diagnosis not present

## 2012-02-12 DIAGNOSIS — M48061 Spinal stenosis, lumbar region without neurogenic claudication: Secondary | ICD-10-CM | POA: Diagnosis not present

## 2012-02-12 DIAGNOSIS — R269 Unspecified abnormalities of gait and mobility: Secondary | ICD-10-CM | POA: Diagnosis not present

## 2012-02-14 DIAGNOSIS — I2510 Atherosclerotic heart disease of native coronary artery without angina pectoris: Secondary | ICD-10-CM | POA: Diagnosis not present

## 2012-02-14 DIAGNOSIS — IMO0001 Reserved for inherently not codable concepts without codable children: Secondary | ICD-10-CM | POA: Diagnosis not present

## 2012-02-14 DIAGNOSIS — Z4789 Encounter for other orthopedic aftercare: Secondary | ICD-10-CM | POA: Diagnosis not present

## 2012-02-14 DIAGNOSIS — M48061 Spinal stenosis, lumbar region without neurogenic claudication: Secondary | ICD-10-CM | POA: Diagnosis not present

## 2012-02-14 DIAGNOSIS — R269 Unspecified abnormalities of gait and mobility: Secondary | ICD-10-CM | POA: Diagnosis not present

## 2012-02-14 DIAGNOSIS — M6281 Muscle weakness (generalized): Secondary | ICD-10-CM | POA: Diagnosis not present

## 2012-02-19 DIAGNOSIS — IMO0001 Reserved for inherently not codable concepts without codable children: Secondary | ICD-10-CM | POA: Diagnosis not present

## 2012-02-19 DIAGNOSIS — M48061 Spinal stenosis, lumbar region without neurogenic claudication: Secondary | ICD-10-CM | POA: Diagnosis not present

## 2012-02-19 DIAGNOSIS — R269 Unspecified abnormalities of gait and mobility: Secondary | ICD-10-CM | POA: Diagnosis not present

## 2012-02-19 DIAGNOSIS — Z4789 Encounter for other orthopedic aftercare: Secondary | ICD-10-CM | POA: Diagnosis not present

## 2012-02-19 DIAGNOSIS — I2510 Atherosclerotic heart disease of native coronary artery without angina pectoris: Secondary | ICD-10-CM | POA: Diagnosis not present

## 2012-02-19 DIAGNOSIS — M6281 Muscle weakness (generalized): Secondary | ICD-10-CM | POA: Diagnosis not present

## 2012-02-21 ENCOUNTER — Inpatient Hospital Stay (HOSPITAL_COMMUNITY)
Admission: EM | Admit: 2012-02-21 | Discharge: 2012-02-26 | DRG: 300 | Disposition: A | Discharge status: Home Health | Payer: Medicare Other | Attending: Internal Medicine | Admitting: Internal Medicine

## 2012-02-21 ENCOUNTER — Encounter (HOSPITAL_COMMUNITY): Payer: Self-pay

## 2012-02-21 DIAGNOSIS — I10 Essential (primary) hypertension: Secondary | ICD-10-CM | POA: Diagnosis not present

## 2012-02-21 DIAGNOSIS — Z87891 Personal history of nicotine dependence: Secondary | ICD-10-CM

## 2012-02-21 DIAGNOSIS — I82409 Acute embolism and thrombosis of unspecified deep veins of unspecified lower extremity: Secondary | ICD-10-CM | POA: Diagnosis not present

## 2012-02-21 DIAGNOSIS — R52 Pain, unspecified: Secondary | ICD-10-CM | POA: Diagnosis not present

## 2012-02-21 DIAGNOSIS — Z905 Acquired absence of kidney: Secondary | ICD-10-CM

## 2012-02-21 DIAGNOSIS — E669 Obesity, unspecified: Secondary | ICD-10-CM | POA: Diagnosis present

## 2012-02-21 DIAGNOSIS — E860 Dehydration: Secondary | ICD-10-CM | POA: Diagnosis present

## 2012-02-21 DIAGNOSIS — N281 Cyst of kidney, acquired: Secondary | ICD-10-CM | POA: Diagnosis not present

## 2012-02-21 DIAGNOSIS — M810 Age-related osteoporosis without current pathological fracture: Secondary | ICD-10-CM | POA: Diagnosis present

## 2012-02-21 DIAGNOSIS — M7989 Other specified soft tissue disorders: Secondary | ICD-10-CM

## 2012-02-21 DIAGNOSIS — N189 Chronic kidney disease, unspecified: Secondary | ICD-10-CM | POA: Diagnosis present

## 2012-02-21 DIAGNOSIS — G609 Hereditary and idiopathic neuropathy, unspecified: Secondary | ICD-10-CM | POA: Diagnosis present

## 2012-02-21 DIAGNOSIS — Z6830 Body mass index (BMI) 30.0-30.9, adult: Secondary | ICD-10-CM

## 2012-02-21 DIAGNOSIS — Z7901 Long term (current) use of anticoagulants: Secondary | ICD-10-CM

## 2012-02-21 DIAGNOSIS — I2699 Other pulmonary embolism without acute cor pulmonale: Secondary | ICD-10-CM

## 2012-02-21 DIAGNOSIS — C649 Malignant neoplasm of unspecified kidney, except renal pelvis: Secondary | ICD-10-CM | POA: Diagnosis present

## 2012-02-21 DIAGNOSIS — Z86711 Personal history of pulmonary embolism: Secondary | ICD-10-CM

## 2012-02-21 DIAGNOSIS — M48061 Spinal stenosis, lumbar region without neurogenic claudication: Secondary | ICD-10-CM | POA: Diagnosis present

## 2012-02-21 DIAGNOSIS — D696 Thrombocytopenia, unspecified: Secondary | ICD-10-CM | POA: Diagnosis present

## 2012-02-21 DIAGNOSIS — N179 Acute kidney failure, unspecified: Secondary | ICD-10-CM | POA: Diagnosis present

## 2012-02-21 DIAGNOSIS — I129 Hypertensive chronic kidney disease with stage 1 through stage 4 chronic kidney disease, or unspecified chronic kidney disease: Secondary | ICD-10-CM | POA: Diagnosis present

## 2012-02-21 DIAGNOSIS — Z86718 Personal history of other venous thrombosis and embolism: Secondary | ICD-10-CM

## 2012-02-21 DIAGNOSIS — K625 Hemorrhage of anus and rectum: Secondary | ICD-10-CM

## 2012-02-21 DIAGNOSIS — M79609 Pain in unspecified limb: Secondary | ICD-10-CM | POA: Diagnosis not present

## 2012-02-21 DIAGNOSIS — E7800 Pure hypercholesterolemia, unspecified: Secondary | ICD-10-CM | POA: Diagnosis present

## 2012-02-21 DIAGNOSIS — D649 Anemia, unspecified: Secondary | ICD-10-CM | POA: Diagnosis present

## 2012-02-21 DIAGNOSIS — E785 Hyperlipidemia, unspecified: Secondary | ICD-10-CM

## 2012-02-21 DIAGNOSIS — M129 Arthropathy, unspecified: Secondary | ICD-10-CM | POA: Diagnosis present

## 2012-02-21 DIAGNOSIS — Z79899 Other long term (current) drug therapy: Secondary | ICD-10-CM

## 2012-02-21 DIAGNOSIS — Z85528 Personal history of other malignant neoplasm of kidney: Secondary | ICD-10-CM

## 2012-02-21 DIAGNOSIS — R1110 Vomiting, unspecified: Secondary | ICD-10-CM

## 2012-02-21 DIAGNOSIS — Z800 Family history of malignant neoplasm of digestive organs: Secondary | ICD-10-CM

## 2012-02-21 DIAGNOSIS — Z9889 Other specified postprocedural states: Secondary | ICD-10-CM

## 2012-02-21 LAB — CBC WITH DIFFERENTIAL/PLATELET
Basophils Relative: 0 % (ref 0–1)
Eosinophils Absolute: 0.1 10*3/uL (ref 0.0–0.7)
HCT: 35 % — ABNORMAL LOW (ref 36.0–46.0)
Hemoglobin: 11.2 g/dL — ABNORMAL LOW (ref 12.0–15.0)
MCH: 30.6 pg (ref 26.0–34.0)
MCHC: 32 g/dL (ref 30.0–36.0)
Monocytes Absolute: 0.5 10*3/uL (ref 0.1–1.0)
Monocytes Relative: 9 % (ref 3–12)

## 2012-02-21 LAB — BASIC METABOLIC PANEL
CO2: 22 mEq/L (ref 19–32)
Calcium: 9.8 mg/dL (ref 8.4–10.5)
Creatinine, Ser: 3.4 mg/dL — ABNORMAL HIGH (ref 0.50–1.10)
Glucose, Bld: 106 mg/dL — ABNORMAL HIGH (ref 70–99)

## 2012-02-21 LAB — URINALYSIS, ROUTINE W REFLEX MICROSCOPIC
Bilirubin Urine: NEGATIVE
Hgb urine dipstick: NEGATIVE
Protein, ur: NEGATIVE mg/dL
Specific Gravity, Urine: 1.025 (ref 1.005–1.030)
Urobilinogen, UA: 0.2 mg/dL (ref 0.0–1.0)

## 2012-02-21 MED ORDER — ACETAMINOPHEN 325 MG PO TABS
650.0000 mg | ORAL_TABLET | Freq: Four times a day (QID) | ORAL | Status: DC | PRN
  Administered 2012-02-21 – 2012-02-22 (×2): 650 mg via ORAL
  Filled 2012-02-21 (×2): qty 2

## 2012-02-21 MED ORDER — SODIUM CHLORIDE 0.9 % IV BOLUS (SEPSIS)
1000.0000 mL | Freq: Once | INTRAVENOUS | Status: AC
  Administered 2012-02-21: 1000 mL via INTRAVENOUS

## 2012-02-21 MED ORDER — HEPARIN (PORCINE) IN NACL 100-0.45 UNIT/ML-% IJ SOLN
1100.0000 [IU]/h | INTRAMUSCULAR | Status: DC
  Administered 2012-02-21: 1000 [IU]/h via INTRAVENOUS
  Administered 2012-02-21: 1100 [IU]/h via INTRAVENOUS
  Filled 2012-02-21: qty 250

## 2012-02-21 MED ORDER — ONDANSETRON HCL 4 MG/2ML IJ SOLN: 4.0000 mg | Freq: Four times a day (QID) | INTRAMUSCULAR | Status: DC | PRN

## 2012-02-21 MED ORDER — DOCUSATE SODIUM 100 MG PO CAPS
100.0000 mg | ORAL_CAPSULE | Freq: Two times a day (BID) | ORAL | Status: DC
  Administered 2012-02-21 – 2012-02-26 (×10): 100 mg via ORAL
  Filled 2012-02-21 (×10): qty 1

## 2012-02-21 MED ORDER — ACETAMINOPHEN 650 MG RE SUPP: 650.0000 mg | Freq: Four times a day (QID) | RECTAL | Status: DC | PRN

## 2012-02-21 MED ORDER — GABAPENTIN 300 MG PO CAPS
300.0000 mg | ORAL_CAPSULE | Freq: Three times a day (TID) | ORAL | Status: DC
  Administered 2012-02-21 – 2012-02-26 (×14): 300 mg via ORAL
  Filled 2012-02-21 (×14): qty 1

## 2012-02-21 MED ORDER — ONDANSETRON HCL 4 MG PO TABS
4.0000 mg | ORAL_TABLET | Freq: Four times a day (QID) | ORAL | Status: DC | PRN
  Administered 2012-02-22: 4 mg via ORAL
  Filled 2012-02-21: qty 1

## 2012-02-21 MED ORDER — ALBUTEROL SULFATE (5 MG/ML) 0.5% IN NEBU
2.5000 mg | INHALATION_SOLUTION | RESPIRATORY_TRACT | Status: DC | PRN
  Administered 2012-02-26: 2.5 mg via RESPIRATORY_TRACT
  Filled 2012-02-21: qty 0.5

## 2012-02-21 MED ORDER — WARFARIN - PHARMACIST DOSING INPATIENT: Freq: Every day | Status: DC

## 2012-02-21 MED ORDER — SODIUM CHLORIDE 0.9 % IV SOLN
INTRAVENOUS | Status: AC
  Administered 2012-02-21: 500 mL/h via INTRAVENOUS
  Administered 2012-02-22 (×2): via INTRAVENOUS

## 2012-02-21 MED ORDER — HYDROMORPHONE HCL PF 1 MG/ML IJ SOLN: 1.0000 mg | INTRAMUSCULAR | Status: DC | PRN

## 2012-02-21 NOTE — ED Notes (Signed)
Pt had lumbar surgery 3 weeks ago.  C/O pain and swelling to left leg since yesterday.  EMS reports decreased pedal pulses.   Reports history of blood clots and is on coumadin.

## 2012-02-21 NOTE — ED Notes (Signed)
Pt c/o left leg pain and swelling that she first noticed last night. Pt had back sx 3 weeks ago and reports no complications or unusual symptoms since. Pt denies chest pain, weakness, SOB, NVD. Pedal pulses felt bilaterally.

## 2012-02-21 NOTE — Progress Notes (Signed)
ANTICOAGULATION CONSULT NOTE - Initial Consult  Pharmacy Consult for Heparin Indication: R/O Acute VTE, Hx VTE  Allergies  Allergen Reactions  . Hydrocodone Nausea Only  . Oxycodone Nausea Only    Patient Measurements: Height: 5\' 5"  (165.1 cm) Weight: 184 lb 4.9 oz (83.6 kg) IBW/kg (Calculated) : 57  Heparin Dosing Weight: 77 kg  Vital Signs: Temp: 99.3 F (37.4 C) (12/04 2240) Temp src: Oral (12/04 2240) BP: 100/85 mmHg (12/04 2240) Pulse Rate: 80  (12/04 2240)  Labs:  [facility] 02/21/12 1844  HGB 11.2*  HCT 35.0*  PLT 90*  APTT --  LABPROT 14.9  INR 1.19  HEPARINUNFRC --  CREATININE 3.40*  CKTOTAL --  CKMB --  TROPONINI --    Estimated Creatinine Clearance: 15.5 ml/min (by C-G formula based on Cr of 3.4).   Medical History: Past Medical History  Diagnosis Date  . History of kidney cancer   . Hypertension   . DVT (deep venous thrombosis) 12/2010    right lower extremity and PE but also had prior DVT and had been on coumadin but converted to plavix/asa due to "reaction" until she presented with another DVT/PE in 12/2010.  Marland Kitchen Hypercholesteremia   . Osteoporosis   . Pulmonary embolism 12/2010  . Spinal stenosis   . Arthritis   . PONV (postoperative nausea and vomiting)     Medications:  Medications Prior to Admission  Medication Sig Dispense Refill  . Calcium Carb-Cholecalciferol (CALCIUM 600/VITAMIN D3) 600-800 MG-UNIT TABS Take 1 tablet by mouth daily.      Marland Kitchen gabapentin (NEURONTIN) 300 MG capsule Take 1 capsule by mouth Three times a day.      Marland Kitchen HYDROmorphone (DILAUDID) 2 MG tablet Take 2-4 mg by mouth every 4 (four) hours as needed. For pain      . lisinopril-hydrochlorothiazide (PRINZIDE,ZESTORETIC) 20-12.5 MG per tablet Take 1 tablet by mouth daily.       . Magnesium 250 MG TABS Take 1 tablet by mouth daily.      . ondansetron (ZOFRAN) 4 MG tablet Take 4 mg by mouth every 6 (six) hours as needed. nausea      . warfarin (COUMADIN) 5 MG tablet Take  2.5-5 mg by mouth daily. *Take one-half tablet by mouth on Sundays and Tuesdays, then take one tablet on all other days        Assessment: Okay for Protocol Recent Surgery  Goal of Therapy:  Hep Level 0.3-0.7 Monitor Pltc per Protocol   Plan:  Heparin Currently at 1000 units/hr. Patient took warfarin at home today per med rec. Increase Heparin to 1100 units/hr. Hep Level in 6-8 hours. Daily Hep Level, CBC and PT/INR. Dose Warfarin based on AM INR.  Biagio Quint R 02/21/2012,11:28 PM

## 2012-02-21 NOTE — ED Provider Notes (Signed)
History    This chart was scribed for Sharyon Cable, MD, MD by Rhae Lerner, ED Scribe. The patient was seen in room APA09 and the patient's care was started at 6:33PM.   CSN: [id number]  Arrival date & time 02/21/12  1808   None     Chief Complaint  Patient presents with  . Leg Pain     Patient is a 76 y.o. female presenting with leg pain. The history is provided by the patient and a relative. No language interpreter was used.  Leg Pain  The incident occurred yesterday. The incident occurred at home. There was no injury mechanism. The pain is present in the left leg. The pain is moderate. The pain has been constant since onset. Associated symptoms include numbness and muscle weakness.   Lisa Lucas is a 76 y.o. female who presents to the Emergency Department BIB EMS accompanied by relatives complaining of constant, moderate left leg pain onset 1 day ago. Reports having numbness in left leg and swelling. She has weakness in left leg but has had this since previous to surgery. Pt had ruptured disc removed from lumbar spine by Dr. Joya Salm January 25 2012. Pt saw Dr. Joya Salm 2 days ago and had normal evaluation. Pt currently has PE and hx of DVT. She was on coumadin therapy but was taken off due to surgery. She has restarted coumadin today. Pt denies fever chills, chest pain, SOB, vomiting, diarrhea, syncope, nausea and any other pain. Family reports pt has decreased appetite.   Past Medical History  Diagnosis Date  . History of kidney cancer   . Hypertension   . DVT (deep venous thrombosis) 12/2010    right lower extremity and PE but also had prior DVT and had been on coumadin but converted to plavix/asa due to "reaction" until she presented with another DVT/PE in 12/2010.  Marland Kitchen Hypercholesteremia   . Osteoporosis   . Pulmonary embolism 12/2010  . Spinal stenosis   . Arthritis   . PONV (postoperative nausea and vomiting)     Past Surgical History  Procedure Date  . Abdominal  hysterectomy   . Hernia repair   . Appendectomy   . Kidney surgery 2010    bilateral renal cancer s/p bilateral partial nephrectomy  . Leg surgery     right leg for blood clot  . Colonoscopy 12/29/2011    Procedure: COLONOSCOPY;  Surgeon: Danie Binder, MD;  Location: [facility];  Service: Endoscopy;  Laterality: N/A;  11:30  . Lumbar laminectomy/decompression microdiscectomy 01/30/2012    Procedure: LUMBAR LAMINECTOMY/DECOMPRESSION MICRODISCECTOMY 1 LEVEL;  Surgeon: Floyce Stakes, MD;  Location: [facility];  Service: Neurosurgery;  Laterality: Left;  Left Lumbar [place] Diskectomy    Family History  Problem Relation Age of Onset  . Colon cancer Daughter     age 70s  . Colon cancer Brother     age 15    History  Substance Use Topics  . Smoking status: Former Smoker    Quit date: 12/10/2010  . Smokeless tobacco: Not on file  . Alcohol Use: No     Comment: quit long time ago    OB History    Grav Para Term Preterm Abortions TAB SAB Ect Mult Living                  Review of Systems  Constitutional: Negative for fever and chills.  Respiratory: Negative for chest tightness and shortness of breath.   Cardiovascular: Positive  for leg swelling. Negative for chest pain.  Gastrointestinal: Negative for nausea, vomiting and diarrhea.  Neurological: Positive for numbness.  All other systems reviewed and are negative.    Allergies  Hydrocodone and Oxycodone  Home Medications   Current Outpatient Rx  Name  Route  Sig  Dispense  Refill  . CALCIUM 600 + D PO   Oral   Take 1 tablet by mouth daily.          Marland Kitchen GABAPENTIN 300 MG PO CAPS   Oral   Take 1 capsule by mouth Three times a day.         Marland Kitchen HYDROMORPHONE HCL 2 MG PO TABS   Oral   Take 2 mg by mouth 2 (two) times daily as needed. For pain         . LISINOPRIL-HYDROCHLOROTHIAZIDE 20-12.5 MG PO TABS   Oral   Take 1 tablet by mouth daily.          Marland Kitchen MAGNESIUM 250 MG PO TABS   Oral   Take 1  tablet by mouth daily.         Marland Kitchen ONDANSETRON HCL 4 MG PO TABS   Oral   Take 4 mg by mouth every 8 (eight) hours as needed. nausea           BP 80/46  Pulse 70  Temp 98.1 F (36.7 C) (Oral)  Resp 18  Ht 5\' 5"  (1.651 m)  Wt 180 lb (81.647 kg)  BMI 29.95 kg/m2  SpO2 93%  Physical Exam  Nursing note and vitals reviewed.  CONSTITUTIONAL: Well developed/well nourished HEAD AND FACE: Normocephalic/atraumatic EYES: EOMI/PERRL ENMT: Mucous membranes moist NECK: supple no meningeal signs SPINE:entire spine nontender, well healed surgical scar  CV: S1/S2 noted, no murmurs/rubs/gallops noted LUNGS: Lungs are clear to auscultation bilaterally, no apparent distress ABDOMEN: soft, nontender, no rebound or guarding GU:no cva tenderness NEURO: Pt is awake/alert, moves all extremitiesx4.   no clonus bilaterally, plantar reflex appropriate.  Equal patellar/achilles reflex noted.  She can flex at left hip, she can dorsi/plantarflex left ankle and she has great toe extension on left but is limited due to pain in her left LE EXTREMITIES: pulses normal, full ROM, pitting edema to left lower extremity with tenderness of left thigh and left calf SKIN: warm, color normal PSYCH: no abnormalities of mood noted  ED Course  Procedures   CRITICAL CARE Performed by: Sharyon Cable   Total critical care time: 35  Critical care time was exclusive of separately billable procedures and treating other patients.  Critical care was necessary to treat or prevent imminent or life-threatening deterioration.  Critical care was time spent personally by me on the following activities: development of treatment plan with patient and/or surrogate as well as nursing, discussions with consultants, evaluation of patient's response to treatment, examination of patient, obtaining history from patient or surrogate, ordering and performing treatments and interventions, ordering and review of laboratory studies,  ordering and review of radiographic studies, pulse oximetry and re-evaluation of patient's condition.  DIAGNOSTIC STUDIES: Oxygen Saturation is 93% on [place], low by my interpretation.    COORDINATION OF CARE: 6:44 PM Discussed ED treatment with pt   8:05 PM Pt with left LE swelling, suspect recurrent DVT.  She has distal perfusion noted.  She has no gross neuro deficits from her baseline per patient.  She is hypotensive but this is improving with IV fluids.  Acute renal failure is noted.  Will likely need admission She denies  new back pain  8:32 PM Pt stable.  SBP>90.  She has no cp/sob.  She denies abd or back pain Will start heparin for presumed DVT in left LE 9:39 PM SBP improved Will admit to tele Discussed with dr Heron Sabins Reviewed  BASIC METABOLIC PANEL - Abnormal; Notable for the following:    Sodium 134 (*)     Glucose, Bld 106 (*)     BUN 53 (*)     Creatinine, Ser 3.40 (*)     GFR calc non Af Amer 12 (*)     GFR calc Af Amer 14 (*)     All other components within normal limits  CBC WITH DIFFERENTIAL - Abnormal; Notable for the following:    RBC 3.66 (*)     Hemoglobin 11.2 (*)     HCT 35.0 (*)     Platelets 90 (*)     All other components within normal limits  PROTIME-INR  URINALYSIS, ROUTINE W REFLEX MICROSCOPIC  LACTIC ACID, PLASMA     MDM  Nursing notes including past medical history and social history reviewed and considered in documentation Labs/vital reviewed and considered Previous records reviewed and considered - recent lumbar laminectomy noted     I personally performed the services described in this documentation, which was scribed in my presence. The recorded information has been reviewed and is accurate.      Date: 02/21/2012  Rate: 79  Rhythm: normal sinus rhythm  QRS Axis: normal  Intervals: normal  ST/T Wave abnormalities: nonspecific ST changes  Conduction Disutrbances:none  Narrative Interpretation:   Old EKG Reviewed:  unchanged    Sharyon Cable, MD 02/21/12 2139

## 2012-02-21 NOTE — ED Notes (Signed)
Pt has bilateral pedal pulses.  Swelling noted to left leg.

## 2012-02-21 NOTE — H&P (Signed)
[facility] History and Physical  KAJA KEENER [id number] DOB: December 20, 1935 DOA: 02/21/2012   PCP: Wende Neighbors, MD  Specialists: She is followed by a nephrologist in [facility]. She is followed by Dr. Joya Salm with Neurosurgery  Chief Complaint: Left leg swelling and pain  HPI: Lisa Lucas is a 76 y.o. female with a past medical history of PE and DVT, most recently diagnosed in October of 2012. She also has a history of peripherl neuropathy, and low back pain, and underwent a lumbar laminectomy about 3 weeks ago. Patient was in her usual state of health and followed up with her neurosurgeon on Monday, and everything seemed to be stable. And, then yesterday she noticed that her left leg was swelling. The swelling got worse today, and she started having pain all over her left leg. She could not walk. She denies any chest pain or shortness of breath. No dizziness or lightheadedness. She says, that she was asked to stop her Coumadin for the surgery and was asked to resume it today. Denies any difficulty urination or incontinence. Denies any stool incontinence. She has been having numbness in the left leg, but denies any warmth or redness. She has had poor oral intake and poor apetite for the last couple of weeks, but denies any nausea, vomiting, or diarrhea. Denies any changes in her weakness in the left leg or numbness recently. The weakness that she has today is the same that she had when she saw Dr. Joya Salm on Monday.  Home Medications: Prior to Admission medications   Medication Sig Start Date End Date Taking? Authorizing Provider  Calcium Carb-Cholecalciferol (CALCIUM 600/VITAMIN D3) 600-800 MG-UNIT TABS Take 1 tablet by mouth daily.   Yes Historical Provider, MD  gabapentin (NEURONTIN) 300 MG capsule Take 1 capsule by mouth Three times a day. 12/15/11  Yes Historical Provider, MD  HYDROmorphone (DILAUDID) 2 MG tablet Take 2-4 mg by mouth every 4 (four) hours as needed. For pain   Yes  Historical Provider, MD  lisinopril-hydrochlorothiazide (PRINZIDE,ZESTORETIC) 20-12.5 MG per tablet Take 1 tablet by mouth daily.    Yes Historical Provider, MD  Magnesium 250 MG TABS Take 1 tablet by mouth daily.   Yes Historical Provider, MD  ondansetron (ZOFRAN) 4 MG tablet Take 4 mg by mouth every 6 (six) hours as needed. nausea   Yes Historical Provider, MD  warfarin (COUMADIN) 5 MG tablet Take 2.5-5 mg by mouth daily. *Take one-half tablet by mouth on Sundays and Tuesdays, then take one tablet on all other days 12/15/11  Yes Historical Provider, MD    Allergies:  Allergies  Allergen Reactions  . Hydrocodone Nausea Only  . Oxycodone Nausea Only    Past Medical History: Past Medical History  Diagnosis Date  . History of kidney cancer   . Hypertension   . DVT (deep venous thrombosis) 12/2010    right lower extremity and PE but also had prior DVT and had been on coumadin but converted to plavix/asa due to "reaction" until she presented with another DVT/PE in 12/2010.  Marland Kitchen Hypercholesteremia   . Osteoporosis   . Pulmonary embolism 12/2010  . Spinal stenosis   . Arthritis   . PONV (postoperative nausea and vomiting)     Past Surgical History  Procedure Date  . Abdominal hysterectomy   . Hernia repair   . Appendectomy   . Kidney surgery 2010    bilateral renal cancer s/p bilateral partial nephrectomy  . Leg surgery     right leg for blood  clot  . Colonoscopy 12/29/2011    Procedure: COLONOSCOPY;  Surgeon: Danie Binder, MD;  Location: [facility];  Service: Endoscopy;  Laterality: N/A;  11:30  . Lumbar laminectomy/decompression microdiscectomy 01/30/2012    Procedure: LUMBAR LAMINECTOMY/DECOMPRESSION MICRODISCECTOMY 1 LEVEL;  Surgeon: Floyce Stakes, MD;  Location: [place] [facility];  Service: Neurosurgery;  Laterality: Left;  Left Lumbar Four-Five Diskectomy    Social History:  reports that she quit smoking about 14 months ago. She does not have any smokeless tobacco history  on file. She reports that she does not drink alcohol or use illicit drugs.  Living Situation: She lives in [place] by herself Activity Level: She uses a walker to emulate   Family History:  Family History  Problem Relation Age of Onset  . Colon cancer Daughter     age 19s  . Colon cancer Brother     age 63     Review of Systems - History obtained from the patient General ROS: positive for  - fatigue Psychological ROS: negative Ophthalmic ROS: negative ENT ROS: negative Allergy and Immunology ROS: negative Hematological and Lymphatic ROS: negative Endocrine ROS: negative Respiratory ROS: no cough, shortness of breath, or wheezing Cardiovascular ROS: no chest pain or dyspnea on exertion Gastrointestinal ROS: no abdominal pain, change in bowel habits, or black or bloody stools Genito-Urinary ROS: no dysuria, trouble voiding, or hematuria Musculoskeletal ROS: as in hpi Neurological ROS: left leg weakness as before Dermatological ROS: negative  Physical Examination  Filed Vitals:   02/21/12 1809 02/21/12 2018 02/21/12 2107  BP: 80/46 94/54 109/74  Pulse: 70 71 71  Temp: 98.1 F (36.7 C)    TempSrc: Oral    Resp: 18  16  Height: 5\' 5"  (1.651 m)    Weight: 81.647 kg (180 lb)    SpO2: 93% 97% 96%    General appearance: alert, cooperative, appears stated age, no distress and moderately obese Head: Normocephalic, without obvious abnormality, atraumatic Eyes: conjunctivae/corneas clear. PERRL, EOM's intact.  Throat: lips, mucosa, and tongue normal; teeth and gums normal Neck: no adenopathy, no carotid bruit, no JVD, supple, symmetrical, trachea midline and thyroid not enlarged, symmetric, no tenderness/mass/nodules Resp: clear to auscultation bilaterally Cardio: regular rate and rhythm, S1, S2 normal, no murmur, click, rub or gallop GI: soft, non-tender; bowel sounds normal; no masses,  no organomegaly Extremities: Left lower extremity appears to be used more swollen. It is  definitely larger than the right. She does have calf tenderness on the left side as well. No erythema is noted. Pulses: 2+ and symmetric Skin: Skin color, texture, turgor normal. No rashes or lesions Lymph nodes: Cervical, supraclavicular, and axillary nodes normal. Neurologic: She is alert and oriented x3. No cranial nerve deficits. Motor strength is equal, bilateral upper extremities. She does have about weakness in the left lower extremity with hip flexion. She also has weakness with knee flexion as well as ankle extension. However, these changes apparently, are not new, according to the patient.  Laboratory Data: Results for orders placed during the hospital encounter of 02/21/12 (from the past 48 hour(s))  BASIC METABOLIC PANEL     Status: Abnormal   Collection Time   02/21/12  6:44 PM      Component Value Range Comment   Sodium 134 (*) 135 - 145 mEq/L    Potassium 5.1  3.5 - 5.1 mEq/L    Chloride 101  96 - 112 mEq/L    CO2 22  19 - 32 mEq/L  Glucose, Bld 106 (*) 70 - 99 mg/dL    BUN 53 (*) 6 - 23 mg/dL    Creatinine, Ser 3.40 (*) 0.50 - 1.10 mg/dL    Calcium 9.8  8.4 - 10.5 mg/dL    GFR calc non Af Amer 12 (*) >90 mL/min    GFR calc Af Amer 14 (*) >90 mL/min   PROTIME-INR     Status: Normal   Collection Time   02/21/12  6:44 PM      Component Value Range Comment   Prothrombin Time 14.9  11.6 - 15.2 seconds    INR 1.19  0.00 - 1.49   CBC WITH DIFFERENTIAL     Status: Abnormal   Collection Time   02/21/12  6:44 PM      Component Value Range Comment   WBC 5.6  4.0 - 10.5 K/uL    RBC 3.66 (*) 3.87 - 5.11 MIL/uL    Hemoglobin 11.2 (*) 12.0 - 15.0 g/dL    HCT 35.0 (*) 36.0 - 46.0 %    MCV 95.6  78.0 - 100.0 fL    MCH 30.6  26.0 - 34.0 pg    MCHC 32.0  30.0 - 36.0 g/dL    RDW 14.1  11.5 - 15.5 %    Platelets 90 (*) 150 - 400 K/uL    Neutrophils Relative 65  43 - 77 %    Neutro Abs 3.6  1.7 - 7.7 K/uL    Lymphocytes Relative 24  12 - 46 %    Lymphs Abs 1.3  0.7 - 4.0 K/uL     Monocytes Relative 9  3 - 12 %    Monocytes Absolute 0.5  0.1 - 1.0 K/uL    Eosinophils Relative 2  0 - 5 %    Eosinophils Absolute 0.1  0.0 - 0.7 K/uL    Basophils Relative 0  0 - 1 %    Basophils Absolute 0.0  0.0 - 0.1 K/uL   LACTIC ACID, PLASMA     Status: Normal   Collection Time   02/21/12  7:24 PM      Component Value Range Comment   Lactic Acid, Venous 1.5  0.5 - 2.2 mmol/L   URINALYSIS, ROUTINE W REFLEX MICROSCOPIC     Status: Normal   Collection Time   02/21/12  7:36 PM      Component Value Range Comment   Color, Urine YELLOW  YELLOW    APPearance CLEAR  CLEAR    Specific Gravity, Urine 1.025  1.005 - 1.030    pH 5.5  5.0 - 8.0    Glucose, UA NEGATIVE  NEGATIVE mg/dL    Hgb urine dipstick NEGATIVE  NEGATIVE    Bilirubin Urine NEGATIVE  NEGATIVE    Ketones, ur NEGATIVE  NEGATIVE mg/dL    Protein, ur NEGATIVE  NEGATIVE mg/dL    Urobilinogen, UA 0.2  0.0 - 1.0 mg/dL    Nitrite NEGATIVE  NEGATIVE    Leukocytes, UA NEGATIVE  NEGATIVE MICROSCOPIC NOT DONE ON URINES WITH NEGATIVE PROTEIN, BLOOD, LEUKOCYTES, NITRITE, OR GLUCOSE <1000 mg/dL.    Radiology Reports: No results found.  Electrocardiogram: Sinus rhythm at 79 beats per minute. Normal axis. Intervals are normal. No Q waves. No concerning ST or T-wave changes are noted.  Assessment/Plan  Principal Problem:  *Renal failure, acute Active Problems:  Kidney carcinoma  History of DVT (deep vein thrombosis)  Chronic anticoagulation  Swelling of left lower extremity  History of back surgery  Thrombocytopenia   #1 left leg swelling: This is most likely an acute DVT. She has a history of PE and DVT, and is on chronic anticoagulation. It is unclear if she has a history of hypercoagulability. However, coumadin was discontinued for the surgery. That is what probably prompted the blood clot. We will get a venous Doppler in the morning to confirm. We'll put her on intravenous heparin and resume warfarin for now.  #2 acute  renal failure: This is most likely due to poor appetite. She appears to be clinically dehydrated, and was hypotensive on presentation. Blood pressure has improved with the IV fluid boluses. We will continue hydration. Will monitor renal function closely. We'll get an ultrasound of the kidneys in the morning. Urine output will be monitored closely.  #3 Thrombocytopenia: Probably from consumption. Monitor platelet counts closely while she is on anticoagulation.  #4 there is supposedly a history of kidney cancer, although I'm not able to find any objective evidence for the same in previous records.  #5 history of back pain with recent lumbar laminectomy: She does have some weakness in the left lower extremity. However, the patient tells me that this is chronic. She was seen by a neurosurgeon recently on Monday. We'll continue with physical therapy and occupational therapy as soon as we are able to. She was getting home health physical therapy and was supposed to start outpatient physical therapy on Monday.  We will hold her nephrotoxic agents. We'll continue to monitor on telemetry.  Code Status: She is a full code Family Communication: Her cousin was in the room and all of the above was communicated to him with the patient's permission.  Disposition Plan: Will likely return home when improved   [facility]  [facility] Pager [phone]  If 7PM-7AM, please contact night-coverage [email or web address] Password [facility]  02/21/2012, 9:57 PM

## 2012-02-22 ENCOUNTER — Observation Stay (HOSPITAL_COMMUNITY): Payer: Medicare Other

## 2012-02-22 DIAGNOSIS — I10 Essential (primary) hypertension: Secondary | ICD-10-CM

## 2012-02-22 DIAGNOSIS — M7989 Other specified soft tissue disorders: Secondary | ICD-10-CM | POA: Diagnosis not present

## 2012-02-22 DIAGNOSIS — N179 Acute kidney failure, unspecified: Secondary | ICD-10-CM | POA: Diagnosis not present

## 2012-02-22 DIAGNOSIS — E860 Dehydration: Secondary | ICD-10-CM

## 2012-02-22 DIAGNOSIS — Z9889 Other specified postprocedural states: Secondary | ICD-10-CM

## 2012-02-22 DIAGNOSIS — N281 Cyst of kidney, acquired: Secondary | ICD-10-CM | POA: Diagnosis not present

## 2012-02-22 DIAGNOSIS — M79609 Pain in unspecified limb: Secondary | ICD-10-CM | POA: Diagnosis not present

## 2012-02-22 LAB — COMPREHENSIVE METABOLIC PANEL WITH GFR
ALT: 6 U/L (ref 0–35)
AST: 13 U/L (ref 0–37)
Albumin: 2.5 g/dL — ABNORMAL LOW (ref 3.5–5.2)
Alkaline Phosphatase: 66 U/L (ref 39–117)
BUN: 49 mg/dL — ABNORMAL HIGH (ref 6–23)
CO2: 18 meq/L — ABNORMAL LOW (ref 19–32)
Calcium: 8.1 mg/dL — ABNORMAL LOW (ref 8.4–10.5)
Chloride: 111 meq/L (ref 96–112)
Creatinine, Ser: 2.91 mg/dL — ABNORMAL HIGH (ref 0.50–1.10)
GFR calc Af Amer: 17 mL/min — ABNORMAL LOW
GFR calc non Af Amer: 15 mL/min — ABNORMAL LOW
Glucose, Bld: 112 mg/dL — ABNORMAL HIGH (ref 70–99)
Potassium: 5 meq/L (ref 3.5–5.1)
Sodium: 139 meq/L (ref 135–145)
Total Bilirubin: 0.4 mg/dL (ref 0.3–1.2)
Total Protein: 5.4 g/dL — ABNORMAL LOW (ref 6.0–8.3)

## 2012-02-22 LAB — CBC
MCH: 30.9 pg (ref 26.0–34.0)
MCHC: 32 g/dL (ref 30.0–36.0)
MCV: 96.5 fL (ref 78.0–100.0)
Platelets: 72 10*3/uL — ABNORMAL LOW (ref 150–400)
RDW: 14.1 % (ref 11.5–15.5)

## 2012-02-22 LAB — HEPARIN LEVEL (UNFRACTIONATED): Heparin Unfractionated: 0.46 [IU]/mL (ref 0.30–0.70)

## 2012-02-22 MED ORDER — HYDROMORPHONE HCL 2 MG PO TABS
2.0000 mg | ORAL_TABLET | ORAL | Status: DC | PRN
  Administered 2012-02-22 – 2012-02-24 (×3): 2 mg via ORAL
  Filled 2012-02-22 (×3): qty 1

## 2012-02-22 MED ORDER — SODIUM CHLORIDE 0.9 % IV SOLN
INTRAVENOUS | Status: DC
  Administered 2012-02-22 – 2012-02-25 (×7): via INTRAVENOUS

## 2012-02-22 MED ORDER — HEPARIN (PORCINE) IN NACL 100-0.45 UNIT/ML-% IJ SOLN
900.0000 [IU]/h | INTRAMUSCULAR | Status: DC
  Administered 2012-02-22: 1100 [IU]/h via INTRAVENOUS
  Administered 2012-02-23: 900 [IU]/h via INTRAVENOUS
  Filled 2012-02-22 (×2): qty 250

## 2012-02-22 MED ORDER — ONDANSETRON HCL 4 MG PO TABS: 4.0000 mg | ORAL_TABLET | Freq: Four times a day (QID) | ORAL | Status: DC | PRN

## 2012-02-22 MED ORDER — SODIUM CHLORIDE 0.9 % IV BOLUS (SEPSIS)
500.0000 mL | Freq: Once | INTRAVENOUS | Status: AC
  Administered 2012-02-22: 500 mL via INTRAVENOUS

## 2012-02-22 MED ORDER — WARFARIN SODIUM 5 MG PO TABS
5.0000 mg | ORAL_TABLET | Freq: Once | ORAL | Status: AC
  Administered 2012-02-22: 5 mg via ORAL
  Filled 2012-02-22: qty 1

## 2012-02-22 NOTE — Progress Notes (Signed)
     Subjective: This lady was admitted yesterday with left leg swelling which clinically appears to be a DVT. Venous Dopplers have not been done yet. She is on intravenous heparin drip and warfarin has been restarted. She denies any cough or dyspnea. There is no fever.           Physical Exam: Blood pressure 111/32, pulse 69, temperature 98.7 F (37.1 C), temperature source Oral, resp. rate 19, height 5\' 5"  (1.651 m), weight 83.6 kg (184 lb 4.9 oz), SpO2 99.00%. She looks systemically well. She is obese. Heart sounds are present and normal without murmurs or gallop rhythm. Lung fields are clear. Left leg clearly is swollen and clearly appears to have signs of DVT. She is alert and orientated.   Investigations:     Basic Metabolic Panel:  Basename 02/22/12 0448 02/21/12 1844  NA 139 134*  K 5.0 5.1  CL 111 101  CO2 18* 22  GLUCOSE 112* 106*  BUN 49* 53*  CREATININE 2.91* 3.40*  CALCIUM 8.1* 9.8  MG -- --  PHOS -- --   Liver Function Tests:  [facility] 02/22/12 0448  AST 13  ALT 6  ALKPHOS 66  BILITOT 0.4  PROT 5.4*  ALBUMIN 2.5*     CBC:  Basename 02/22/12 0448 02/21/12 1844  WBC 4.2 5.6  NEUTROABS -- 3.6  HGB 8.8* 11.2*  HCT 27.5* 35.0*  MCV 96.5 95.6  PLT 72* 90*        Medications: I have reviewed the patient's current medications.  Impression: 1. Left leg swelling, findings consistent with DVT, await venous Doppler. On IV heparin and warfarin orally. 2. Recent L4-L5 back surgery, stable. 3. Thrombocytopenia and anemia, unclear etiology. 4. Dehydration and acute renal failure, improving with hydration.      Plan: 1. Continue with IV fluids with increased rate. 2. Continue with anticoagulation. Await venous Doppler study.     LOS: 1 day   Doree Albee Pager [phone]  02/22/2012, 8:20 AM

## 2012-02-22 NOTE — Progress Notes (Signed)
Rechecked patient's blood pressure at 0250  and it was hypotensive, with systolic pressure in the [id number].  Notified Dr. Maryland Pink and received an order for a 500 cc bolus.  Bolus give per order and after recheck, blood pressure was 90/50(manually).  Will continue to monitor patient.

## 2012-02-22 NOTE — Progress Notes (Signed)
UR chart review completed.  

## 2012-02-22 NOTE — Progress Notes (Signed)
[place] for Heparin Indication: R/O Acute VTE, Hx VTE  Allergies  Allergen Reactions  . Hydrocodone Nausea Only  . Oxycodone Nausea Only    Patient Measurements: Height: 5\' 5"  (165.1 cm) Weight: 184 lb 4.9 oz (83.6 kg) IBW/kg (Calculated) : 57  Heparin Dosing Weight: 77 kg  Vital Signs: Temp: 98.7 F (37.1 C) (12/05 0556) Temp src: Oral (12/05 0556) BP: 111/32 mmHg (12/05 0556) Pulse Rate: 69  (12/05 0556)  Labs:  Basename 02/22/12 0448 02/21/12 1844  HGB 8.8* 11.2*  HCT 27.5* 35.0*  PLT 72* 90*  APTT -- --  LABPROT 15.9* 14.9  INR 1.30 1.19  HEPARINUNFRC 0.46 --  CREATININE 2.91* 3.40*  CKTOTAL -- --  CKMB -- --  TROPONINI -- --    Estimated Creatinine Clearance: 18.1 ml/min (by C-G formula based on Cr of 2.91).   Medical History: Past Medical History  Diagnosis Date  . History of kidney cancer   . Hypertension   . DVT (deep venous thrombosis) 12/2010    right lower extremity and PE but also had prior DVT and had been on coumadin but converted to plavix/asa due to "reaction" until she presented with another DVT/PE in 12/2010.  Marland Kitchen Hypercholesteremia   . Osteoporosis   . Pulmonary embolism 12/2010  . Spinal stenosis   . Arthritis   . PONV (postoperative nausea and vomiting)     Medications:  Medications Prior to Admission  Medication Sig Dispense Refill  . Calcium Carb-Cholecalciferol (CALCIUM 600/VITAMIN D3) 600-800 MG-UNIT TABS Take 1 tablet by mouth daily.      Marland Kitchen gabapentin (NEURONTIN) 300 MG capsule Take 1 capsule by mouth Three times a day.      Marland Kitchen HYDROmorphone (DILAUDID) 2 MG tablet Take 2-4 mg by mouth every 4 (four) hours as needed. For pain      . lisinopril-hydrochlorothiazide (PRINZIDE,ZESTORETIC) 20-12.5 MG per tablet Take 1 tablet by mouth daily.       . Magnesium 250 MG TABS Take 1 tablet by mouth daily.      . ondansetron (ZOFRAN) 4 MG tablet Take 4 mg by mouth every 6 (six) hours as needed. nausea       . warfarin (COUMADIN) 5 MG tablet Take 2.5-5 mg by mouth daily. *Take one-half tablet by mouth on Sundays and Tuesdays, then take one tablet on all other days        Assessment: Okay for Protocol Recent Surgery Heparin Level Therapeutic Hg dropped to 8.8 and PLTC now 72. INR below goal.  Goal of Therapy:  Hep Level 0.3-0.7 Monitor Pltc per Protocol   Plan:  Continue Heparin @ 1100 units/hr. Daily Hep Level, CBC and PT/INR. Warfarin 5mg  PO x 1 today.  Biagio Quint R 02/22/2012,8:20 AM

## 2012-02-22 NOTE — Care Management Note (Signed)
    Page 1 of 1   02/26/2012     3:51:37 PM   CARE MANAGEMENT NOTE 02/26/2012  Patient:  Lisa Lucas, Lisa Lucas   Account Number:  [id number]  Date Initiated:  02/22/2012  Documentation initiated by:  Theophilus Kinds  Subjective/Objective Assessment:   Pt admitted from home with acute renal failure and DVT. Pt lives with her daughter and has several family members who live on her property. Pt wants to return home at discharge. Pt is currently active with [facility].     Action/Plan:   Pt interested in [place] and increasing services at discharge. Will need RN and aide. Pt has walker for home use. Will continue to follow.   Anticipated DC Date:  02/26/2012   Anticipated DC Plan:  [place]  CM consult      Choice offered to / List presented to:          [facility] arranged  HH-1 RN  [place] agency  [place]   Status of service:  Completed, signed off Medicare Important Message given?  YES (If response is "NO", the following Medicare IM given date fields will be blank) Date Medicare IM given:  02/26/2012 Date Additional Medicare IM given:    Discharge Disposition:    Per UR Regulation:    If discussed at Long Length of Stay Meetings, dates discussed:    Comments:  02/26/12 Claretha Cooper RN BSN CM 02/22/12 1455 Christinia Gully, RN BSN CM

## 2012-02-22 NOTE — Progress Notes (Signed)
OT Cancellation Note  Patient Details Name: Lisa Lucas MRN: [id number] DOB: 03/09/1936   Cancelled Treatment:    Reason Eval/Treat Not Completed: Medical issues which prohibited therapy (DVT not R/O, not on heparin x 24 hours,INR not in range) Will attempt eval tomorrow if medically stable.   Ailene Ravel, OTR/L 02/22/2012, 1:29 PM

## 2012-02-22 NOTE — Progress Notes (Signed)
PT Cancellation Note  Patient Details Name: Lisa Lucas MRN: [id number] DOB: 12/17/1935   Cancelled Treatment:    Reason Eval/Treat Not Completed: Medical issues which prohibited therapy;Other (comment) (DVT not R/O, not on heparin x 24 hours,INR not in range) Will plan to see pt tomorrow if medically stable.  Demetrios Isaacs L 02/22/2012, 9:23 AM

## 2012-02-23 DIAGNOSIS — Z7901 Long term (current) use of anticoagulants: Secondary | ICD-10-CM

## 2012-02-23 LAB — COMPREHENSIVE METABOLIC PANEL
ALT: 7 U/L (ref 0–35)
AST: 14 U/L (ref 0–37)
CO2: 20 mEq/L (ref 19–32)
Calcium: 8.7 mg/dL (ref 8.4–10.5)
Chloride: 111 mEq/L (ref 96–112)
GFR calc non Af Amer: 19 mL/min — ABNORMAL LOW (ref >90)
Potassium: 5.3 mEq/L — ABNORMAL HIGH (ref 3.5–5.1)
Sodium: 139 mEq/L (ref 135–145)
Total Bilirubin: 0.3 mg/dL (ref 0.3–1.2)

## 2012-02-23 LAB — CBC
HCT: 28.6 % — ABNORMAL LOW (ref 36.0–46.0)
Hemoglobin: 9 g/dL — ABNORMAL LOW (ref 12.0–15.0)
RBC: 2.93 MIL/uL — ABNORMAL LOW (ref 3.87–5.11)
WBC: 3.9 10*3/uL — ABNORMAL LOW (ref 4.0–10.5)

## 2012-02-23 LAB — IRON AND TIBC
Saturation Ratios: 15 % — ABNORMAL LOW (ref 20–55)
TIBC: 330 ug/dL (ref 250–470)

## 2012-02-23 LAB — VITAMIN B12: Vitamin B-12: 401 pg/mL (ref 211–911)

## 2012-02-23 LAB — PROTIME-INR: INR: 1.24 (ref 0.00–1.49)

## 2012-02-23 LAB — RETICULOCYTES: Retic Ct Pct: 2 % (ref 0.4–3.1)

## 2012-02-23 MED ORDER — WARFARIN SODIUM 5 MG PO TABS
5.0000 mg | ORAL_TABLET | Freq: Once | ORAL | Status: AC
  Administered 2012-02-23: 5 mg via ORAL
  Filled 2012-02-23: qty 1

## 2012-02-23 MED ORDER — FUROSEMIDE 10 MG/ML IJ SOLN
40.0000 mg | Freq: Once | INTRAMUSCULAR | Status: AC
  Administered 2012-02-23: 40 mg via INTRAVENOUS
  Filled 2012-02-23: qty 4

## 2012-02-23 NOTE — Evaluation (Signed)
Physical Therapy Evaluation Patient Details Name: Lisa Lucas MRN: [id number] DOB: [birth year] Today's Date: 02/23/2012 Time: [id number] PT Time Calculation (min): 35 min  PT Assessment / Plan / Recommendation Clinical Impression  Pt wasfound to be very drowsy this afternoon, but agreeable to PT eval.  Her LLE is edemetous and the weight of her leg is difficult for her to manage functionally.  She required min assist to transfer in/out of bed and to stand at bedside with a walker.  Upon standing, she became drowsy and c/o lightheadedness.  Her BP was taken sitting and found to be WNL.  She wished to return to supine..  My expectation is that she will be able to ambulate functional distances with a walker, although gait will be labored due to weakness of LLE.  She should be able to return to home at d/c with HHPT.    PT Assessment  Patient needs continued PT services    Follow Up Recommendations  Home health PT    Does the patient have the potential to tolerate intense rehabilitation      Barriers to Discharge None      Equipment Recommendations  Tub/shower seat    Recommendations for Other Services     Frequency Min 3X/week    Precautions / Restrictions Precautions Precautions: Fall   Pertinent Vitals/Pain       Mobility  Bed Mobility Bed Mobility: Sit to Supine Right Sidelying to Sit: 4: Min assist;HOB flat;With rails Sit to Supine: 4: Min assist;With rail;HOB flat Details for Bed Mobility Assistance: pt needs assist to assist LLE into the bed Transfers Sit to Stand: 4: Min assist;With upper extremity assist;From bed Stand to Sit: To bed;Without upper extremity assist;3: Mod assist Details for Transfer Assistance: patient became lightheaded and weak once standing and requested to sit. Patient sat very quickly without waiting for direction from therapist. Ambulation/Gait Ambulation/Gait Assistance: Not tested (comment) Stairs: No Wheelchair Mobility Wheelchair  Mobility: No    Shoulder Instructions     Exercises General Exercises - Lower Extremity Ankle Circles/Pumps: AROM;Both;10 reps;Supine Quad Sets: AROM;Both;10 reps;Supine Gluteal Sets: AROM;Both;10 reps;Supine Short Arc Quad: AAROM;Left;10 reps;Supine Heel Slides: AAROM;Left;10 reps;Supine Straight Leg Raises: AAROM;Left;10 reps;Supine   PT Diagnosis: Difficulty walking;Generalized weakness  PT Problem List: Decreased strength;Decreased activity tolerance;Decreased mobility PT Treatment Interventions: Gait training;Functional mobility training;Therapeutic activities;Therapeutic exercise;Patient/family education   PT Goals Acute Rehab PT Goals PT Goal Formulation: With patient Time For Goal Achievement: 03/08/12 Potential to Achieve Goals: Good Pt will Roll Supine to Right Side: Independently PT Goal: Rolling Supine to Right Side - Progress: Goal set today Pt will go Supine/Side to Sit: with HOB 0 degrees;with supervision PT Goal: Supine/Side to Sit - Progress: Goal set today Pt will go Sit to Supine/Side: with supervision;with HOB 0 degrees PT Goal: Sit to Supine/Side - Progress: Goal set today Pt will go Sit to Stand: with modified independence;with upper extremity assist PT Goal: Sit to Stand - Progress: Goal set today Pt will Ambulate: 16 - 50 feet;with rolling walker;with supervision PT Goal: Ambulate - Progress: Goal set today Pt will Go Up / Down Stairs: 3-5 stairs;with min assist;with rail(s) PT Goal: Up/Down Stairs - Progress: Goal set today  Visit Information  Last PT Received On: 02/23/12 Assistance Needed: +1 PT/OT Co-Evaluation/Treatment: Yes    Subjective Data  Subjective: feels tired Patient Stated Goal: none stated   Prior Functioning  Home Living Lives With: Daughter (daughter works from home) Available Help at Discharge: Family;Available 24 hours/day Type of  Home: House Home Access: Stairs to enter [employer] of Steps: 2 Entrance  Stairs-Rails: None Home Layout: One level Bathroom Shower/Tub: Walk-in shower;Door [employer] Toilet: Standard Bathroom Accessibility: Yes How Accessible: Accessible via walker Home Adaptive Equipment: Walker - rolling;Bedside commode/3-in-1 Prior Function Level of Independence: Independent with assistive device(s) Able to Take Stairs?: Yes Driving: Yes Vocation: Retired Corporate investment banker: No difficulties Dominant Hand: Right    Cognition  Overall Cognitive Status: Appears within functional limits for tasks assessed/performed Arousal/Alertness: Awake/alert Orientation Level: Appears intact for tasks assessed Behavior During Session: [facility] for tasks performed    Extremity/Trunk Assessment Right Upper Extremity Assessment RUE ROM/Strength/Tone: Within functional levels (MMT: 4/5) RUE Sensation: WFL - Light Touch;WFL - Proprioception RUE Coordination: WFL - gross/fine motor Left Upper Extremity Assessment LUE ROM/Strength/Tone: Within functional levels (MMT: 4/5) LUE Sensation: WFL - Light Touch;WFL - Proprioception LUE Coordination: WFL - gross/fine motor Right Lower Extremity Assessment RLE ROM/Strength/Tone: Within functional levels RLE Sensation: WFL - Light Touch RLE Coordination: WFL - gross motor Left Lower Extremity Assessment LLE ROM/Strength/Tone: Deficits LLE ROM/Strength/Tone Deficits: LLE is significantly edemetous causing difficulty for pt to left the weight of it...strength at hip and knee is functionally 2/5 LLE Sensation: WFL - Light Touch LLE Coordination: WFL - gross motor Trunk Assessment Trunk Assessment: Normal   Balance Balance Balance Assessed: No  End of Session PT - End of Session Equipment Utilized During Treatment: Gait belt Activity Tolerance: Treatment limited secondary to medical complications (Comment) (pt lightheaded) Patient left: in bed;with call bell/phone within reach;with bed alarm set  GP     Sable Feil 02/23/2012, 3:28  PM

## 2012-02-23 NOTE — Progress Notes (Signed)
[place] for Heparin Indication: R/O Acute VTE, Hx VTE  Allergies  Allergen Reactions  . Hydrocodone Nausea Only  . Oxycodone Nausea Only    Patient Measurements: Height: 5\' 5"  (165.1 cm) Weight: 184 lb 4.9 oz (83.6 kg) IBW/kg (Calculated) : 57  Heparin Dosing Weight: 77 kg  Vital Signs: Temp: 98.5 F (36.9 C) (12/06 0511) Temp src: Oral (12/06 0511) BP: 100/48 mmHg (12/06 0511) Pulse Rate: 80  (12/06 0511)  Labs:  Basename 02/23/12 0436 02/22/12 0448 02/21/12 1844  HGB 9.0* 8.8* --  HCT 28.6* 27.5* 35.0*  PLT 93* 72* 90*  APTT -- -- --  LABPROT 15.4* 15.9* 14.9  INR 1.24 1.30 1.19  HEPARINUNFRC 0.97* 0.46 --  CREATININE 2.38* 2.91* 3.40*  CKTOTAL -- -- --  CKMB -- -- --  TROPONINI -- -- --    Estimated Creatinine Clearance: 22.1 ml/min (by C-G formula based on Cr of 2.38).   Medical History: Past Medical History  Diagnosis Date  . History of kidney cancer   . Hypertension   . DVT (deep venous thrombosis) 12/2010    right lower extremity and PE but also had prior DVT and had been on coumadin but converted to plavix/asa due to "reaction" until she presented with another DVT/PE in 12/2010.  Marland Kitchen Hypercholesteremia   . Osteoporosis   . Pulmonary embolism 12/2010  . Spinal stenosis   . Arthritis   . PONV (postoperative nausea and vomiting)     Medications:  Medications Prior to Admission  Medication Sig Dispense Refill  . Calcium Carb-Cholecalciferol (CALCIUM 600/VITAMIN D3) 600-800 MG-UNIT TABS Take 1 tablet by mouth daily.      Marland Kitchen gabapentin (NEURONTIN) 300 MG capsule Take 1 capsule by mouth Three times a day.      Marland Kitchen HYDROmorphone (DILAUDID) 2 MG tablet Take 2-4 mg by mouth every 4 (four) hours as needed. For pain      . lisinopril-hydrochlorothiazide (PRINZIDE,ZESTORETIC) 20-12.5 MG per tablet Take 1 tablet by mouth daily.       . Magnesium 250 MG TABS Take 1 tablet by mouth daily.      . ondansetron (ZOFRAN) 4 MG  tablet Take 4 mg by mouth every 6 (six) hours as needed. nausea      . warfarin (COUMADIN) 5 MG tablet Take 2.5-5 mg by mouth daily. *Take one-half tablet by mouth on Sundays and Tuesdays, then take one tablet on all other days        Assessment: Okay for Protocol Recent Surgery Heparin Level has risen to supra-therapeutic range INR below goal, titrate Warfarin for therapeutic INR  Goal of Therapy:  Hep Level 0.3-0.7 Monitor Pltc per Protocol   Plan:  Reduce Heparin to 900 units/hr. Re-check heparin level in 6 hours Daily Hep Level, CBC and PT/INR. Warfarin 5mg  PO x 1 today.  [place] Crane, Khiya Friese A 02/23/2012,8:20 AM

## 2012-02-23 NOTE — Evaluation (Signed)
Occupational Therapy Evaluation Patient Details Name: Lisa Lucas MRN: [id number] DOB: 11/25/1935 Today's Date: 02/23/2012 Time: [id number] OT Time Calculation (min): 30 min  OT Assessment / Plan / Recommendation Clinical Impression  Patient is a 76 y/o female s/p renal failure presenting to acute OT with deficits below. Patient will benefit from OT services to increase ADL performance, functional transfers, and overall strength and endurance. Patient unable to transfer to recliner due to becoming dizzy and weak. BP was checked: and was normal. Recommend Home Health OT at D/C.    OT Assessment  Patient needs continued OT Services    Follow Up Recommendations  Home health OT       Equipment Recommendations  Tub/shower seat       Frequency  Min 2X/week    Precautions / Restrictions Precautions Precautions: Fall   Pertinent Vitals/Pain No complaints.    ADL  Lower Body Dressing: Performed;Minimal assistance Where Assessed - Lower Body Dressing: Supported sit to stand Transfers/Ambulation Related to ADLs: Patient unable to transfer at this time due dizziness and fatigue. Once standing, patient became very light headed.    OT Diagnosis: Generalized weakness  OT Problem List: Decreased strength;Decreased activity tolerance;Impaired balance (sitting and/or standing) OT Treatment Interventions: Self-care/ADL training;Therapeutic exercise;Energy conservation;Therapeutic activities;Balance training;Patient/family education   OT Goals Acute Rehab OT Goals OT Goal Formulation: With patient Time For Goal Achievement: 03/08/12 Potential to Achieve Goals: Good ADL Goals Pt Will Perform Lower Body Bathing: Sit to stand from bed;with supervision ADL Goal: Lower Body Bathing - Progress: Goal set today Pt Will Perform Lower Body Dressing: with supervision;Sit to stand from bed ADL Goal: Lower Body Dressing - Progress: Goal set today Pt Will Transfer to Toilet: with  supervision;3-in-1;with DME ADL Goal: Toilet Transfer - Progress: Goal set today Pt Will Perform Toileting - Clothing Manipulation: with modified independence;Standing ADL Goal: Toileting - Clothing Manipulation - Progress: Goal set today Pt Will Perform Toileting - Hygiene: with modified independence;Sit to stand from 3-in-1/toilet;Leaning right and/or left on 3-in-1/toilet ADL Goal: Toileting - Hygiene - Progress: Goal set today Arm Goals Pt Will Complete Theraband Exer: with supervision, verbal cues required/provided;to maintain strength;Bilateral upper extremities;2 sets;10 reps;Level 2 Theraband Arm Goal: Theraband Exercises - Progress: Goal set today Miscellaneous OT Goals Miscellaneous OT Goal #1: Patient will be educated on energy conservation techniques. OT Goal: Miscellaneous Goal #1 - Progress: Goal set today  Visit Information  Last OT Received On: 02/23/12 Assistance Needed: +1 PT/OT Co-Evaluation/Treatment: Yes    Subjective Data  Subjective: "my daughter is having problems with her knee and is in the ER today." Patient Stated Goal: To go home.   Prior Functioning     Home Living Lives With: Daughter (daughter works from home) Available Help at Discharge: Family;Available 24 hours/day Type of Home: House Home Access: Stairs to enter [employer] of Steps: 2 Entrance Stairs-Rails: None Home Layout: One level Bathroom Shower/Tub: Walk-in shower;Door [employer] Toilet: Standard Bathroom Accessibility: Yes How Accessible: Accessible via walker Home Adaptive Equipment: Walker - rolling;Bedside commode/3-in-1 Prior Function Level of Independence: Independent with assistive device(s) Able to Take Stairs?: Yes Driving: Yes Vocation: Retired Corporate investment banker: No difficulties Dominant Hand: Right            Cognition  Overall Cognitive Status: Appears within functional limits for tasks assessed/performed Arousal/Alertness:  Awake/alert Orientation Level: Appears intact for tasks assessed Behavior During Session: [facility] for tasks performed    Extremity/Trunk Assessment Right Upper Extremity Assessment RUE ROM/Strength/Tone: Within functional levels (MMT: 4/5) RUE  Sensation: WFL - Light Touch;WFL - Proprioception RUE Coordination: WFL - gross/fine motor Left Upper Extremity Assessment LUE ROM/Strength/Tone: Within functional levels (MMT: 4/5) LUE Sensation: WFL - Light Touch;WFL - Proprioception LUE Coordination: WFL - gross/fine motor     Mobility Bed Mobility Bed Mobility: Right Sidelying to Sit Right Sidelying to Sit: 4: Min assist;HOB flat;With rails Transfers Transfers: Sit to Stand;Stand to Sit Sit to Stand: 4: Min assist;With upper extremity assist;From bed Stand to Sit: To bed;Without upper extremity assist;3: Mod assist Details for Transfer Assistance: patient became lightheaded and weak once standing and requested to sit. Patient sat very quickly without waiting for direction from therapist.              End of Session OT - End of Session Equipment Utilized During Treatment: Gait belt Activity Tolerance: Patient tolerated treatment well;Patient limited by fatigue Patient left: in bed;with call bell/phone within reach;with bed alarm set    Ailene Ravel, OTR/L 02/23/2012, 2:57 PM

## 2012-02-23 NOTE — Progress Notes (Signed)
     Subjective: This lady was admitted with left leg swelling which clinically appears to be a DVT. Venous Dopplers have not been done yet. She is on intravenous heparin drip and warfarin has been restarted. She denies any cough or dyspnea. There is no fever. She says that her left leg is still swollen but less painful now.            Physical Exam: Blood pressure 110/50, pulse 80, temperature 98.8 F (37.1 C), temperature source Oral, resp. rate 20, height 5\' 5"  (1.651 m), weight 83.6 kg (184 lb 4.9 oz), SpO2 92.00%. She looks systemically well. She is obese. Heart sounds are present and normal without murmurs or gallop rhythm. Lung fields are clear. Left leg clearly is swollen and clearly appears to have signs of DVT. She is alert and orientated.   Investigations:     Basic Metabolic Panel:  Basename 02/23/12 0436 02/22/12 0448  NA 139 139  K 5.3* 5.0  CL 111 111  CO2 20 18*  GLUCOSE 98 112*  BUN 46* 49*  CREATININE 2.38* 2.91*  CALCIUM 8.7 8.1*  MG -- --  PHOS -- --   Liver Function Tests:  [facility] 02/23/12 0436 02/22/12 0448  AST 14 13  ALT 7 6  ALKPHOS 69 66  BILITOT 0.3 0.4  PROT 5.7* 5.4*  ALBUMIN 2.6* 2.5*     CBC:  Basename 02/23/12 0436 02/22/12 0448 02/21/12 1844  WBC 3.9* 4.2 --  NEUTROABS -- -- 3.6  HGB 9.0* 8.8* --  HCT 28.6* 27.5* --  MCV 97.6 96.5 --  PLT 93* 72* --        Medications: I have reviewed the patient's current medications.  Impression: 1. Left leg extensive DVT. 2. Recent L4-L5 back surgery, stable. 3. Thrombocytopenia and anemia, unclear etiology. 4. Acute on chronic kidney disease, improving with hydration. Baseline creatinine appears to be about 1.7.      Plan: 1. Continue with IV fluids with increased rate. 2. One dose of IV Lasix. 3. Anemia panel.  4. Continue anticoagulation.    LOS: 2 days   Doree Albee Pager [phone]  02/23/2012, 9:58 AM

## 2012-02-24 LAB — BASIC METABOLIC PANEL
BUN: 37 mg/dL — ABNORMAL HIGH (ref 6–23)
Calcium: 8.8 mg/dL (ref 8.4–10.5)
Chloride: 111 mEq/L (ref 96–112)
Creatinine, Ser: 2.14 mg/dL — ABNORMAL HIGH (ref 0.50–1.10)
GFR calc Af Amer: 25 mL/min — ABNORMAL LOW (ref >90)
GFR calc non Af Amer: 22 mL/min — ABNORMAL LOW (ref >90)

## 2012-02-24 LAB — CBC
Hemoglobin: 8.4 g/dL — ABNORMAL LOW (ref 12.0–15.0)
MCH: 30.2 pg (ref 26.0–34.0)
MCV: 97.5 fL (ref 78.0–100.0)
Platelets: 115 10*3/uL — ABNORMAL LOW (ref 150–400)
RBC: 2.78 MIL/uL — ABNORMAL LOW (ref 3.87–5.11)

## 2012-02-24 LAB — HEPARIN LEVEL (UNFRACTIONATED): Heparin Unfractionated: 0.33 IU/mL (ref 0.30–0.70)

## 2012-02-24 MED ORDER — ENOXAPARIN SODIUM 100 MG/ML ~~LOC~~ SOLN
1.0000 mg/kg | Freq: Two times a day (BID) | SUBCUTANEOUS | Status: DC
  Administered 2012-02-24 – 2012-02-25 (×4): 85 mg via SUBCUTANEOUS
  Filled 2012-02-24 (×4): qty 1

## 2012-02-24 MED ORDER — WARFARIN SODIUM 10 MG PO TABS
10.0000 mg | ORAL_TABLET | Freq: Once | ORAL | Status: AC
  Administered 2012-02-24: 10 mg via ORAL
  Filled 2012-02-24: qty 1

## 2012-02-24 MED ORDER — WARFARIN - PHYSICIAN DOSING INPATIENT: Freq: Every day | Status: DC

## 2012-02-24 MED ORDER — BIOTENE DRY MOUTH MT LIQD
15.0000 mL | Freq: Two times a day (BID) | OROMUCOSAL | Status: DC
  Administered 2012-02-24 – 2012-02-25 (×4): 15 mL via OROMUCOSAL

## 2012-02-24 NOTE — Progress Notes (Signed)
     Subjective: This lady is feeling somewhat better. She denies any dyspnea or chest pain. The left leg is somewhat less painful.           Physical Exam: Blood pressure 94/58, pulse 79, temperature 97.9 F (36.6 C), temperature source Oral, resp. rate 18, height 5\' 5"  (1.651 m), weight 83.6 kg (184 lb 4.9 oz), SpO2 97.00%. She looks systemically well. She is obese. Heart sounds are present and normal without murmurs or gallop rhythm. Lung fields are clear. Left leg clearly is swollen and clearly appears to have signs of DVT. She is alert and orientated.   Investigations:     Basic Metabolic Panel:  Basename 02/24/12 0608 02/23/12 0436  NA 140 139  K 5.5* 5.3*  CL 111 111  CO2 21 20  GLUCOSE 98 98  BUN 37* 46*  CREATININE 2.14* 2.38*  CALCIUM 8.8 8.7  MG -- --  PHOS -- --   Liver Function Tests:  [facility] 02/23/12 0436 02/22/12 0448  AST 14 13  ALT 7 6  ALKPHOS 69 66  BILITOT 0.3 0.4  PROT 5.7* 5.4*  ALBUMIN 2.6* 2.5*     CBC:  Basename 02/24/12 0608 02/23/12 0436 02/21/12 1844  WBC 3.8* 3.9* --  NEUTROABS -- -- 3.6  HGB 8.4* 9.0* --  HCT 27.1* 28.6* --  MCV 97.5 97.6 --  PLT 115* 93* --        Medications: I have reviewed the patient's current medications.  Impression: 1. Left leg extensive DVT. 2. Recent L4-L5 back surgery, stable. 3. Thrombocytopenia and anemia, unclear etiology. Anemia panel is unremarkable. 4. Acute on chronic kidney disease, improving with hydration. Baseline creatinine appears to be about 1.7.      Plan: 1. Continue with IV fluids with a reduced rate today. 2. Continue with anticoagulation. Her INR is only 1.2 today. I will increase her Coumadin dose to 10 mg this evening. Switch to therapeutic Lovenox subcutaneously instead of intravenous heparin. 3. Disposition-she will require home health physical and occupational therapy.    LOS: 3 days   Doree Albee Pager [phone]  02/24/2012, 11:21 AM

## 2012-02-24 NOTE — Progress Notes (Signed)
     Subjective: This lady is feeling somewhat better. She denies any dyspnea or chest pain. The left leg is somewhat less painful.           Physical Exam: Blood pressure 94/58, pulse 79, temperature 97.9 F (36.6 C), temperature source Oral, resp. rate 18, height 5\' 5"  (1.651 m), weight 83.6 kg (184 lb 4.9 oz), SpO2 97.00%. She looks systemically well. She is obese. Heart sounds are present and normal without murmurs or gallop rhythm. Lung fields are clear. Left leg clearly is swollen and clearly appears to have signs of DVT. She is alert and orientated.   Investigations:     Basic Metabolic Panel:  Basename 02/24/12 0608 02/23/12 0436  NA 140 139  K 5.5* 5.3*  CL 111 111  CO2 21 20  GLUCOSE 98 98  BUN 37* 46*  CREATININE 2.14* 2.38*  CALCIUM 8.8 8.7  MG -- --  PHOS -- --   Liver Function Tests:  [facility] 02/23/12 0436 02/22/12 0448  AST 14 13  ALT 7 6  ALKPHOS 69 66  BILITOT 0.3 0.4  PROT 5.7* 5.4*  ALBUMIN 2.6* 2.5*     CBC:  Basename 02/24/12 0608 02/23/12 0436 02/21/12 1844  WBC 3.8* 3.9* --  NEUTROABS -- -- 3.6  HGB 8.4* 9.0* --  HCT 27.1* 28.6* --  MCV 97.5 97.6 --  PLT 115* 93* --        Medications: I have reviewed the patient's current medications.  Impression: 1. Left leg extensive DVT. 2. Recent L4-L5 back surgery, stable. 3. Thrombocytopenia and anemia, unclear etiology. Anemia panel is unremarkable. 4. Acute on chronic kidney disease, improving with hydration. Baseline creatinine appears to be about 1.7.      Plan: 1. Continue with IV fluids with a reduced rate today. 2. Continue with anticoagulation. Her INR is only 1.2 today. I will increase her Coumadin dose to 10 mg this evening. Switch to therapeutic Lovenox subcutaneously instead of intravenous heparin.    LOS: 3 days   Doree Albee Pager [phone]  02/24/2012, 11:18 AM

## 2012-02-25 DIAGNOSIS — I82409 Acute embolism and thrombosis of unspecified deep veins of unspecified lower extremity: Principal | ICD-10-CM

## 2012-02-25 LAB — BASIC METABOLIC PANEL
CO2: 21 mEq/L (ref 19–32)
Calcium: 9 mg/dL (ref 8.4–10.5)
Creatinine, Ser: 1.67 mg/dL — ABNORMAL HIGH (ref 0.50–1.10)
GFR calc Af Amer: 34 mL/min — ABNORMAL LOW (ref >90)
GFR calc non Af Amer: 29 mL/min — ABNORMAL LOW (ref >90)
Sodium: 138 mEq/L (ref 135–145)

## 2012-02-25 LAB — CBC
MCHC: 31.6 g/dL (ref 30.0–36.0)
Platelets: 139 10*3/uL — ABNORMAL LOW (ref 150–400)
RDW: 14 % (ref 11.5–15.5)
WBC: 3.2 10*3/uL — ABNORMAL LOW (ref 4.0–10.5)

## 2012-02-25 LAB — PROTIME-INR
INR: 1.48 (ref 0.00–1.49)
Prothrombin Time: 17.5 seconds — ABNORMAL HIGH (ref 11.6–15.2)

## 2012-02-25 MED ORDER — WARFARIN SODIUM 10 MG PO TABS
10.0000 mg | ORAL_TABLET | Freq: Once | ORAL | Status: AC
  Administered 2012-02-25: 10 mg via ORAL
  Filled 2012-02-25: qty 1

## 2012-02-25 NOTE — Progress Notes (Signed)
Chart reviewed.  Subjective: No new complaints. No bleeding. Feeling stronger.  Objective: Vital signs in last 24 hours: Filed Vitals:   02/24/12 1500 02/24/12 2050 02/24/12 2148 02/25/12 0650  BP: 117/72 139/73  111/71  Pulse: 85 83  77  Temp: 98.1 F (36.7 C) 99.3 F (37.4 C)  98 F (36.7 C)  TempSrc:      Resp: 16 16  16   Height:      Weight:      SpO2: 100% 94% 98% 96%   Weight change:   Intake/Output Summary (Last 24 hours) at 02/25/12 1048 Last data filed at 02/25/12 0825  Gross per 24 hour  Intake 2792.2 ml  Output   1850 ml  Net  942.2 ml   General: Comfortable. In chair. Lungs clear to auscultation bilaterally without wheezes rhonchi or rales Cardiovascular regular rate rhythm without murmurs gallops rubs Abdomen soft nontender nondistended Extremities left leg 2+ edema  Lab Results: Basic Metabolic Panel:  Lab [id number] 0549 02/24/12 0608  NA 138 140  K 5.3* 5.5*  CL 109 111  CO2 21 21  GLUCOSE 98 98  BUN 31* 37*  CREATININE 1.67* 2.14*  CALCIUM 9.0 8.8  MG -- --  PHOS -- --   Liver Function Tests:  Lab 02/23/12 0436 02/22/12 0448  AST 14 13  ALT 7 6  ALKPHOS 69 66  BILITOT 0.3 0.4  PROT 5.7* 5.4*  ALBUMIN 2.6* 2.5*   No results found for this basename: LIPASE:2,AMYLASE:2 in the last 168 hours No results found for this basename: AMMONIA:2 in the last 168 hours CBC:  Lab 02/25/12 0549 02/24/12 0608 02/21/12 1844  WBC 3.2* 3.8* --  NEUTROABS -- -- 3.6  HGB 8.6* 8.4* --  HCT 27.2* 27.1* --  MCV 96.8 97.5 --  PLT 139* 115* --   Cardiac Enzymes: No results found for this basename: CKTOTAL:3,CKMB:3,CKMBINDEX:3,TROPONINI:3 in the last 168 hours BNP: No results found for this basename: PROBNP:3 in the last 168 hours D-Dimer: No results found for this basename: DDIMER:2 in the last 168 hours CBG: No results found for this basename: GLUCAP:6 in the last 168 hours Hemoglobin A1C: No results found for this basename: HGBA1C in the last 168  hours Fasting Lipid Panel: No results found for this basename: CHOL,HDL,LDLCALC,TRIG,CHOLHDL,LDLDIRECT in the last 168 hours Thyroid Function Tests: No results found for this basename: TSH,T4TOTAL,FREET4,T3FREE,THYROIDAB in the last 168 hours Coagulation:  Lab 02/25/12 0549 02/24/12 0608 02/23/12 0436 02/22/12 0448  LABPROT 17.5* 15.0 15.4* 15.9*  INR 1.48 1.20 1.24 1.30   Anemia Panel:  Lab 02/23/12 0436  VITAMINB12 401  FOLATE >20.0  FERRITIN 129  TIBC 330  IRON 48  RETICCTPCT 2.0   Urine Drug Screen: Drugs of Abuse  No results found for this basename: labopia, cocainscrnur, labbenz, amphetmu, thcu, labbarb    Alcohol Level: No results found for this basename: ETH:2 in the last 168 hours Urinalysis:  Lab 02/21/12 1936  COLORURINE YELLOW  LABSPEC 1.025  PHURINE 5.5  GLUCOSEU NEGATIVE  HGBUR NEGATIVE  BILIRUBINUR NEGATIVE  KETONESUR NEGATIVE  PROTEINUR NEGATIVE  UROBILINOGEN 0.2  NITRITE NEGATIVE  LEUKOCYTESUR NEGATIVE   Scheduled Meds:   . antiseptic oral rinse  15 mL Mouth Rinse BID  . docusate sodium  100 mg Oral BID  . enoxaparin (LOVENOX) injection  1 mg/kg Subcutaneous Q12H  . gabapentin  300 mg Oral TID  . [COMPLETED] warfarin  10 mg Oral ONCE-1800  . Warfarin - Physician Dosing Inpatient   Does  not apply q1800  . [DISCONTINUED] Warfarin - Pharmacist Dosing Inpatient   Does not apply q1800   Continuous Infusions:   . sodium chloride 75 mL/hr at 02/25/12 0816  . [DISCONTINUED] heparin 900 Units/hr (02/23/12 1837)   PRN Meds:.acetaminophen, acetaminophen, albuterol, HYDROmorphone, ondansetron (ZOFRAN) IV, ondansetron Assessment/Plan: Principal Problem:  *DVT of lower limb, acute Active Problems:  History of DVT (deep vein thrombosis)  Renal failure, acute  Kidney carcinoma  Chronic anticoagulation  History of back surgery  Thrombocytopenia  Discontinue IV fluids. Discontinue telemetry. Likely home tomorrow with home PT and OT. Discussed with  family   LOS: 4 days   Dajia Gunnels L 02/25/2012, 10:48 AM

## 2012-02-25 NOTE — Progress Notes (Signed)
02/25/12 1116 Patient assisted up to chair this morning, tolerated well. States comfortable sitting up, legs elevated. Has tolerated sitting up to chair over past three days well with no complaints. Nursing to monitor. Donavan Foil, RN

## 2012-02-25 NOTE — Progress Notes (Signed)
02/25/12 1854 Patient up to chair again this afternoon, tolerated well. Stated comfortable sitting up with legs elevated. Sat up in chair for about 2 hours and then assisted back to bed. Donavan Foil, RN

## 2012-02-26 ENCOUNTER — Ambulatory Visit: Payer: Medicare Other | Admitting: Physical Therapy

## 2012-02-26 LAB — CBC
MCV: 95.8 fL (ref 78.0–100.0)
Platelets: 154 10*3/uL (ref 150–400)
RBC: 2.87 MIL/uL — ABNORMAL LOW (ref 3.87–5.11)
RDW: 14.1 % (ref 11.5–15.5)
WBC: 2.9 10*3/uL — ABNORMAL LOW (ref 4.0–10.5)

## 2012-02-26 LAB — BASIC METABOLIC PANEL WITH GFR
BUN: 29 mg/dL — ABNORMAL HIGH (ref 6–23)
CO2: 22 meq/L (ref 19–32)
Calcium: 9.2 mg/dL (ref 8.4–10.5)
Chloride: 110 meq/L (ref 96–112)
Creatinine, Ser: 1.56 mg/dL — ABNORMAL HIGH (ref 0.50–1.10)
GFR calc Af Amer: 37 mL/min — ABNORMAL LOW (ref >90)
GFR calc non Af Amer: 32 mL/min — ABNORMAL LOW (ref >90)
Glucose, Bld: 97 mg/dL (ref 70–99)
Potassium: 5.3 meq/L — ABNORMAL HIGH (ref 3.5–5.1)
Sodium: 139 meq/L (ref 135–145)

## 2012-02-26 LAB — PROTIME-INR: Prothrombin Time: 21.6 seconds — ABNORMAL HIGH (ref 11.6–15.2)

## 2012-02-26 MED ORDER — WARFARIN SODIUM 5 MG PO TABS
5.0000 mg | ORAL_TABLET | Freq: Once | ORAL | Status: AC
  Administered 2012-02-26: 5 mg via ORAL
  Filled 2012-02-26: qty 1

## 2012-02-26 MED ORDER — ENOXAPARIN SODIUM 100 MG/ML ~~LOC~~ SOLN: 120.0000 mg | Freq: Once | SUBCUTANEOUS | Status: DC

## 2012-02-26 MED ORDER — ENOXAPARIN SODIUM 150 MG/ML ~~LOC~~ SOLN
1.5000 mg/kg | SUBCUTANEOUS | Status: DC
  Administered 2012-02-26: 125 mg via SUBCUTANEOUS
  Filled 2012-02-26 (×3): qty 1

## 2012-02-26 NOTE — Discharge Summary (Signed)
Physician Discharge Summary  Patient ID: Lisa Lucas MRN: [id number] DOB/AGE: 74-Sep-1937 74 y.o.  Admit date: 02/21/2012 Discharge date: 02/26/2012  Discharge Diagnoses:  Principal Problem:  *DVT of lower limb, acute Active Problems:  History of DVT (deep vein thrombosis)  Renal failure, acute  Kidney carcinoma  Chronic anticoagulation  History of back surgery  Thrombocytopenia    Medication List     As of 02/26/2012 10:41 AM    STOP taking these medications         lisinopril-hydrochlorothiazide 20-12.5 MG per tablet   Commonly known as: PRINZIDE,ZESTORETIC      TAKE these medications         CALCIUM 600/VITAMIN D3 600-800 MG-UNIT Tabs   Generic drug: Calcium Carb-Cholecalciferol   Take 1 tablet by mouth daily.      enoxaparin 100 MG/ML injection   Commonly known as: LOVENOX   Inject 1.2 mLs (120 mg total) into the skin once. One dose on Tuesday, 02/27/12   Start taking on: 02/27/2012      gabapentin 300 MG capsule   Commonly known as: NEURONTIN   Take 1 capsule by mouth Three times a day.      HYDROmorphone 2 MG tablet   Commonly known as: DILAUDID   Take 2-4 mg by mouth every 4 (four) hours as needed. For pain      Magnesium 250 MG Tabs   Take 1 tablet by mouth daily.      ondansetron 4 MG tablet   Commonly known as: ZOFRAN   Take 4 mg by mouth every 6 (six) hours as needed. nausea      warfarin 5 MG tablet   Commonly known as: COUMADIN   Take 2.5-5 mg by mouth daily. *Take one-half tablet by mouth on Sundays and Tuesdays, then take one tablet on all other days            Discharge Orders    Future Appointments: Provider: Department: Dept Phone: Center:   02/26/2012 2:30 PM [place] [address], [place] [facility] [place] [phone] [facility]     Future Orders Please Complete By Expires   Diet - low sodium heart healthy      Walker       Walk with assistance           Disposition: 01-Home or Self Care  Discharged  Condition: stable  Consults:  PT, OT  Labs:    Sodium   139 139 140 138 139    Potassium   5.0 5.3 5.5 5.3 5.3    Chloride   111 111 111 109 110    CO2   18 20 21 21 22     BUN   49 46 37 31 29    Creatinine, Ser   2.91 2.38 2.14 1.67 1.56    Calcium   8.1 8.7 8.8 9.0 9.2    GFR calc non Af Amer   15 19 22 29  32    GFR calc Af Amer   17 22 25  34 37    Glucose, Bld   112 98 98 98 97    Alkaline Phosphatase   66 69       Albumin   2.5 2.6       AST   13 14       ALT   6 7       Total Protein   5.4 5.7       Total Bilirubin   0.4 0.3  IRON /ANEMIA PROFILE   Iron    48       UIBC    282       TIBC    330       Saturation Ratios    15       Ferritin    129       Folate    >20.0       OTHER CHEM   Lactic Acid, Venous   1.5        Vitamin B-12    401       CBC   WBC   4.2 3.9 3.8 3.2 2.9    RBC   2.85 2.93 2.78 2.81 2.87    Hemoglobin   8.8 9.0 8.4 8.6 8.8    HCT   27.5 28.6 27.1 27.2 27.5    MCV   96.5 97.6 97.5 96.8 95.8    MCH   30.9 30.7 30.2 30.6 30.7    MCHC   32.0 31.5 31.0 31.6 32.0    RDW   14.1 14.2 14.2 14.0 14.1    Platelets   72 93 115 139 154    DIFFERENTIAL   Neutrophils Relative   65        Lymphocytes Relative   24        Monocytes Relative   9        Eosinophils Relative   2        Basophils Relative   0        Neutro Abs   3.6        Lymphs Abs   1.3        Monocytes Absolute   0.5        Eosinophils Absolute   0.1        Basophils Absolute   0.0        RBC.    2.92       Retic Ct Pct    2.0       Retic Count, Manual    58.4       HEPARIN STUDIES   Heparin Unfractionated   0.46 0.97 0.33      PROTIME W/ INR   Prothrombin Time   15.9 15.4 15.0 17.5 21.6    INR   1.30 1.24 1.20 1.48 1.96    DIABETES   Glucose, Bld   112 98 98 98 97    URINALYSIS   Color, Urine   YELLOW        APPearance   CLEAR        Specific Gravity, Urine   1.025        pH   5.5        Glucose, UA   NEGATIVE        Bilirubin Urine   NEGATIVE        Ketones, ur    NEGATIVE        Protein, ur   NEGATIVE        Urobilinogen, UA   0.2        Nitrite   NEGATIVE        Leukocytes, UA   NEGATIVE        Hgb urine dipstick   NEGATIVE    Diagnostics:  Dg Chest 2 View  01/30/2012  *RADIOLOGY REPORT*  Clinical Data: Preop for lumbar laminectomy  CHEST - 2 VIEW  Comparison: 01/11/2011  Findings: Cardiomediastinal silhouette is stable.  No acute  infiltrate or pleural effusion.  No pulmonary edema.  Mild atherosclerotic calcifications of thoracic aorta.  Bony thorax is stable.  IMPRESSION: No active disease.   Original Report Authenticated By: Lahoma Crocker, M.D.    Dg Lumbar Spine 1 View  01/30/2012  *RADIOLOGY REPORT*  Clinical Data: Localization for L4-5 discectomy.  LUMBAR SPINE - 1 VIEW  Comparison: MRI of the lumbar spine dated 12/05/2011  Findings: Cross-table lateral view obtained intraoperatively demonstrates placement of posterior retractors and a metallic instrument opposite the superior endplate of L5.  IMPRESSION: Intraoperative localization of the L4-5 disc space.   Original Report Authenticated By: Aletta Edouard, M.D.    US Renal  02/22/2012  *RADIOLOGY REPORT*  Clinical Data: Acute renal failure.  RENAL/URINARY TRACT ULTRASOUND COMPLETE  Comparison:  CT abdomen and pelvis 01/09/2011.  Findings:  Right Kidney:  Measures 9.3 cm.  There is cortical thinning.  No stone, mass or hydronephrosis.  Left Kidney:  Measures 9.4 cm.  No hydronephrosis or stone.  There is linear calcification in the upper pole.  This correlates with a renal cyst with a thin rim of calcification seen on the prior CT.  Bladder:  Bilateral ureteral jets are identified.  Very large bladder diverticulum is again seen as on CT.  IMPRESSION:  1.  Negative for hydronephrosis or acute abnormality. 2.  Cyst in the upper pole of the left kidney with a thin rim of calcification is seen as on the prior examination. 3.  Large bladder diverticulum.   Original Report Authenticated By: Orlean Patten,  M.D.    US Venous Img Lower Unilateral Left  02/22/2012  *RADIOLOGY REPORT*  Clinical Data: History of right-sided DVT and pulmonary embolism, now with increasing left lower extremity pain and swelling while off Coumadin for a back surgery, evaluate for DVT  LEFT LOWER EXTREMITY VENOUS DUPLEX ULTRASOUND  Technique:  Gray-scale sonography with graded compression, as well as color Doppler and duplex ultrasound were performed to evaluate the deep venous system of the lower extremity from the level of the common femoral vein through the popliteal and proximal calf veins. Spectral Doppler was utilized to evaluate flow at rest and with distal augmentation maneuvers.  Comparison:  None.  Findings:  There is occlusive noncompressible hypoechoic thrombus within the left common, superficial femoral, deep femoral and popliteal veins. Occlusive thrombus also extends into the proximal aspect of the left greater saphenous vein, however the greater saphenous appears patent at the level of the mid thigh.  Incidental note is made of an 3.7 x 1.5 x 3.6 cm Yero's cyst within the left popliteal fossa.  IMPRESSION: 1.  Occlusive thrombus within the left common, superficial, deep femoral and popliteal veins.  2. Approximately 3.7 cm Ryer's cyst incidentally noted.  These results will be called to the ordering clinician or representative by the Radiologist Assistant, and communication documented in the PACS Dashboard.   Original Report Authenticated By: Jake Seats, MD   Full Code   Hospital Course: See H&P for complete admission details.  The patient is a 76 year old black female with a history of previous PE and DVT. She has been on Coumadin long-term, but stopped it for back surgery recently. She presented with weakness and left leg swelling and pain. Her appetite had been poor postoperatively. She was managing at home with physical therapy. In the emergency room, her blood pressure initially was 80/46, after fluid has  increased to 109/74. She was noted to have left greater than right leg swelling. BUN was 53.  Creatinine 3.4.  Patient was admitted. Doppler confirmed acute DVT of the left leg. Patient was started on anticoagulation. Her acute renal failure was felt to be related to volume depletion. Her antihypertensives were held and she was started on IV fluids. Her creatinine and hypotension normalized. By the time of discharge her INR was nearly therapeutic. She will get another dose of Lovenox tomorrow, and home health will draw INRs and assist with anticoagulation. Home nursing, physical therapy will be resumed. Total time on the day of discharge greater than 30 minutes.  Discharge Exam:  Blood pressure 106/68, pulse 82, temperature 98 F (36.7 C), temperature source Oral, resp. rate 16, height 5\' 5"  (1.651 m), weight 83.6 kg (184 lb 4.9 oz), SpO2 95.00%.  Unchanged from 03/06/17  Signed: Doree Barthel L 02/26/2012, 10:41 AM

## 2012-02-26 NOTE — Plan of Care (Signed)
Problem: Discharge Progression Outcomes Goal: Other Discharge Outcomes/Goals Outcome: Completed/Met Date Met:  02/26/12 Fue and pneumonia vacines given prior to discharge

## 2012-02-27 DIAGNOSIS — I2510 Atherosclerotic heart disease of native coronary artery without angina pectoris: Secondary | ICD-10-CM | POA: Diagnosis not present

## 2012-02-27 DIAGNOSIS — Z4789 Encounter for other orthopedic aftercare: Secondary | ICD-10-CM | POA: Diagnosis not present

## 2012-02-27 DIAGNOSIS — IMO0001 Reserved for inherently not codable concepts without codable children: Secondary | ICD-10-CM | POA: Diagnosis not present

## 2012-02-27 DIAGNOSIS — M48061 Spinal stenosis, lumbar region without neurogenic claudication: Secondary | ICD-10-CM | POA: Diagnosis not present

## 2012-02-27 DIAGNOSIS — M6281 Muscle weakness (generalized): Secondary | ICD-10-CM | POA: Diagnosis not present

## 2012-02-27 DIAGNOSIS — R269 Unspecified abnormalities of gait and mobility: Secondary | ICD-10-CM | POA: Diagnosis not present

## 2012-02-28 DIAGNOSIS — M48061 Spinal stenosis, lumbar region without neurogenic claudication: Secondary | ICD-10-CM | POA: Diagnosis not present

## 2012-02-28 DIAGNOSIS — IMO0001 Reserved for inherently not codable concepts without codable children: Secondary | ICD-10-CM | POA: Diagnosis not present

## 2012-02-28 DIAGNOSIS — I2510 Atherosclerotic heart disease of native coronary artery without angina pectoris: Secondary | ICD-10-CM | POA: Diagnosis not present

## 2012-02-28 DIAGNOSIS — M6281 Muscle weakness (generalized): Secondary | ICD-10-CM | POA: Diagnosis not present

## 2012-02-28 DIAGNOSIS — Z4789 Encounter for other orthopedic aftercare: Secondary | ICD-10-CM | POA: Diagnosis not present

## 2012-02-28 DIAGNOSIS — R269 Unspecified abnormalities of gait and mobility: Secondary | ICD-10-CM | POA: Diagnosis not present

## 2012-02-29 DIAGNOSIS — I2510 Atherosclerotic heart disease of native coronary artery without angina pectoris: Secondary | ICD-10-CM | POA: Diagnosis not present

## 2012-02-29 DIAGNOSIS — Z4789 Encounter for other orthopedic aftercare: Secondary | ICD-10-CM | POA: Diagnosis not present

## 2012-02-29 DIAGNOSIS — IMO0001 Reserved for inherently not codable concepts without codable children: Secondary | ICD-10-CM | POA: Diagnosis not present

## 2012-02-29 DIAGNOSIS — M6281 Muscle weakness (generalized): Secondary | ICD-10-CM | POA: Diagnosis not present

## 2012-02-29 DIAGNOSIS — M48061 Spinal stenosis, lumbar region without neurogenic claudication: Secondary | ICD-10-CM | POA: Diagnosis not present

## 2012-02-29 DIAGNOSIS — R269 Unspecified abnormalities of gait and mobility: Secondary | ICD-10-CM | POA: Diagnosis not present

## 2012-03-01 DIAGNOSIS — IMO0001 Reserved for inherently not codable concepts without codable children: Secondary | ICD-10-CM | POA: Diagnosis not present

## 2012-03-01 DIAGNOSIS — I2510 Atherosclerotic heart disease of native coronary artery without angina pectoris: Secondary | ICD-10-CM | POA: Diagnosis not present

## 2012-03-01 DIAGNOSIS — R269 Unspecified abnormalities of gait and mobility: Secondary | ICD-10-CM | POA: Diagnosis not present

## 2012-03-01 DIAGNOSIS — M6281 Muscle weakness (generalized): Secondary | ICD-10-CM | POA: Diagnosis not present

## 2012-03-01 DIAGNOSIS — Z4789 Encounter for other orthopedic aftercare: Secondary | ICD-10-CM | POA: Diagnosis not present

## 2012-03-01 DIAGNOSIS — M48061 Spinal stenosis, lumbar region without neurogenic claudication: Secondary | ICD-10-CM | POA: Diagnosis not present

## 2012-03-04 DIAGNOSIS — M48061 Spinal stenosis, lumbar region without neurogenic claudication: Secondary | ICD-10-CM | POA: Diagnosis not present

## 2012-03-04 DIAGNOSIS — I2510 Atherosclerotic heart disease of native coronary artery without angina pectoris: Secondary | ICD-10-CM | POA: Diagnosis not present

## 2012-03-04 DIAGNOSIS — R791 Abnormal coagulation profile: Secondary | ICD-10-CM | POA: Diagnosis not present

## 2012-03-04 DIAGNOSIS — Z87891 Personal history of nicotine dependence: Secondary | ICD-10-CM | POA: Diagnosis not present

## 2012-03-04 DIAGNOSIS — Z86711 Personal history of pulmonary embolism: Secondary | ICD-10-CM | POA: Diagnosis not present

## 2012-03-04 DIAGNOSIS — R059 Cough, unspecified: Secondary | ICD-10-CM | POA: Diagnosis not present

## 2012-03-04 DIAGNOSIS — J189 Pneumonia, unspecified organism: Secondary | ICD-10-CM | POA: Diagnosis not present

## 2012-03-04 DIAGNOSIS — R0602 Shortness of breath: Secondary | ICD-10-CM | POA: Diagnosis not present

## 2012-03-04 DIAGNOSIS — M6281 Muscle weakness (generalized): Secondary | ICD-10-CM | POA: Diagnosis not present

## 2012-03-04 DIAGNOSIS — Z8553 Personal history of malignant neoplasm of renal pelvis: Secondary | ICD-10-CM | POA: Diagnosis not present

## 2012-03-04 DIAGNOSIS — IMO0001 Reserved for inherently not codable concepts without codable children: Secondary | ICD-10-CM | POA: Diagnosis not present

## 2012-03-04 DIAGNOSIS — I739 Peripheral vascular disease, unspecified: Secondary | ICD-10-CM | POA: Diagnosis present

## 2012-03-04 DIAGNOSIS — I10 Essential (primary) hypertension: Secondary | ICD-10-CM | POA: Diagnosis not present

## 2012-03-04 DIAGNOSIS — N9489 Other specified conditions associated with female genital organs and menstrual cycle: Secondary | ICD-10-CM | POA: Diagnosis not present

## 2012-03-04 DIAGNOSIS — I82409 Acute embolism and thrombosis of unspecified deep veins of unspecified lower extremity: Secondary | ICD-10-CM | POA: Diagnosis not present

## 2012-03-04 DIAGNOSIS — R269 Unspecified abnormalities of gait and mobility: Secondary | ICD-10-CM | POA: Diagnosis not present

## 2012-03-04 DIAGNOSIS — N183 Chronic kidney disease, stage 3 unspecified: Secondary | ICD-10-CM | POA: Diagnosis not present

## 2012-03-04 DIAGNOSIS — I824Z9 Acute embolism and thrombosis of unspecified deep veins of unspecified distal lower extremity: Secondary | ICD-10-CM | POA: Diagnosis present

## 2012-03-04 DIAGNOSIS — Z4789 Encounter for other orthopedic aftercare: Secondary | ICD-10-CM | POA: Diagnosis not present

## 2012-03-04 DIAGNOSIS — E785 Hyperlipidemia, unspecified: Secondary | ICD-10-CM | POA: Diagnosis not present

## 2012-03-04 DIAGNOSIS — J438 Other emphysema: Secondary | ICD-10-CM | POA: Diagnosis present

## 2012-03-04 DIAGNOSIS — R05 Cough: Secondary | ICD-10-CM | POA: Diagnosis not present

## 2012-03-04 DIAGNOSIS — I129 Hypertensive chronic kidney disease with stage 1 through stage 4 chronic kidney disease, or unspecified chronic kidney disease: Secondary | ICD-10-CM | POA: Diagnosis not present

## 2012-03-04 DIAGNOSIS — Z7901 Long term (current) use of anticoagulants: Secondary | ICD-10-CM | POA: Diagnosis not present

## 2012-03-08 DIAGNOSIS — R269 Unspecified abnormalities of gait and mobility: Secondary | ICD-10-CM | POA: Diagnosis not present

## 2012-03-08 DIAGNOSIS — IMO0001 Reserved for inherently not codable concepts without codable children: Secondary | ICD-10-CM | POA: Diagnosis not present

## 2012-03-08 DIAGNOSIS — Z4789 Encounter for other orthopedic aftercare: Secondary | ICD-10-CM | POA: Diagnosis not present

## 2012-03-08 DIAGNOSIS — M6281 Muscle weakness (generalized): Secondary | ICD-10-CM | POA: Diagnosis not present

## 2012-03-08 DIAGNOSIS — I2510 Atherosclerotic heart disease of native coronary artery without angina pectoris: Secondary | ICD-10-CM | POA: Diagnosis not present

## 2012-03-08 DIAGNOSIS — M48061 Spinal stenosis, lumbar region without neurogenic claudication: Secondary | ICD-10-CM | POA: Diagnosis not present

## 2012-03-11 DIAGNOSIS — M48061 Spinal stenosis, lumbar region without neurogenic claudication: Secondary | ICD-10-CM | POA: Diagnosis not present

## 2012-03-11 DIAGNOSIS — IMO0001 Reserved for inherently not codable concepts without codable children: Secondary | ICD-10-CM | POA: Diagnosis not present

## 2012-03-11 DIAGNOSIS — Z4789 Encounter for other orthopedic aftercare: Secondary | ICD-10-CM | POA: Diagnosis not present

## 2012-03-11 DIAGNOSIS — M6281 Muscle weakness (generalized): Secondary | ICD-10-CM | POA: Diagnosis not present

## 2012-03-11 DIAGNOSIS — J158 Pneumonia due to other specified bacteria: Secondary | ICD-10-CM | POA: Diagnosis not present

## 2012-03-11 DIAGNOSIS — R269 Unspecified abnormalities of gait and mobility: Secondary | ICD-10-CM | POA: Diagnosis not present

## 2012-03-11 DIAGNOSIS — I803 Phlebitis and thrombophlebitis of lower extremities, unspecified: Secondary | ICD-10-CM | POA: Diagnosis not present

## 2012-03-11 DIAGNOSIS — I2510 Atherosclerotic heart disease of native coronary artery without angina pectoris: Secondary | ICD-10-CM | POA: Diagnosis not present

## 2012-03-11 DIAGNOSIS — I10 Essential (primary) hypertension: Secondary | ICD-10-CM | POA: Diagnosis not present

## 2012-03-12 DIAGNOSIS — R269 Unspecified abnormalities of gait and mobility: Secondary | ICD-10-CM | POA: Diagnosis not present

## 2012-03-12 DIAGNOSIS — M6281 Muscle weakness (generalized): Secondary | ICD-10-CM | POA: Diagnosis not present

## 2012-03-12 DIAGNOSIS — Z4789 Encounter for other orthopedic aftercare: Secondary | ICD-10-CM | POA: Diagnosis not present

## 2012-03-12 DIAGNOSIS — IMO0001 Reserved for inherently not codable concepts without codable children: Secondary | ICD-10-CM | POA: Diagnosis not present

## 2012-03-12 DIAGNOSIS — M48061 Spinal stenosis, lumbar region without neurogenic claudication: Secondary | ICD-10-CM | POA: Diagnosis not present

## 2012-03-12 DIAGNOSIS — I2510 Atherosclerotic heart disease of native coronary artery without angina pectoris: Secondary | ICD-10-CM | POA: Diagnosis not present

## 2012-03-15 DIAGNOSIS — R269 Unspecified abnormalities of gait and mobility: Secondary | ICD-10-CM | POA: Diagnosis not present

## 2012-03-15 DIAGNOSIS — IMO0001 Reserved for inherently not codable concepts without codable children: Secondary | ICD-10-CM | POA: Diagnosis not present

## 2012-03-15 DIAGNOSIS — M48061 Spinal stenosis, lumbar region without neurogenic claudication: Secondary | ICD-10-CM | POA: Diagnosis not present

## 2012-03-15 DIAGNOSIS — I2510 Atherosclerotic heart disease of native coronary artery without angina pectoris: Secondary | ICD-10-CM | POA: Diagnosis not present

## 2012-03-15 DIAGNOSIS — M6281 Muscle weakness (generalized): Secondary | ICD-10-CM | POA: Diagnosis not present

## 2012-03-15 DIAGNOSIS — Z4789 Encounter for other orthopedic aftercare: Secondary | ICD-10-CM | POA: Diagnosis not present

## 2012-03-18 DIAGNOSIS — M6281 Muscle weakness (generalized): Secondary | ICD-10-CM | POA: Diagnosis not present

## 2012-03-18 DIAGNOSIS — IMO0001 Reserved for inherently not codable concepts without codable children: Secondary | ICD-10-CM | POA: Diagnosis not present

## 2012-03-18 DIAGNOSIS — I2510 Atherosclerotic heart disease of native coronary artery without angina pectoris: Secondary | ICD-10-CM | POA: Diagnosis not present

## 2012-03-18 DIAGNOSIS — R269 Unspecified abnormalities of gait and mobility: Secondary | ICD-10-CM | POA: Diagnosis not present

## 2012-03-18 DIAGNOSIS — Z4789 Encounter for other orthopedic aftercare: Secondary | ICD-10-CM | POA: Diagnosis not present

## 2012-03-18 DIAGNOSIS — M48061 Spinal stenosis, lumbar region without neurogenic claudication: Secondary | ICD-10-CM | POA: Diagnosis not present

## 2012-03-21 DIAGNOSIS — Z4789 Encounter for other orthopedic aftercare: Secondary | ICD-10-CM | POA: Diagnosis not present

## 2012-03-21 DIAGNOSIS — R269 Unspecified abnormalities of gait and mobility: Secondary | ICD-10-CM | POA: Diagnosis not present

## 2012-03-21 DIAGNOSIS — IMO0001 Reserved for inherently not codable concepts without codable children: Secondary | ICD-10-CM | POA: Diagnosis not present

## 2012-03-21 DIAGNOSIS — M6281 Muscle weakness (generalized): Secondary | ICD-10-CM | POA: Diagnosis not present

## 2012-03-21 DIAGNOSIS — M48061 Spinal stenosis, lumbar region without neurogenic claudication: Secondary | ICD-10-CM | POA: Diagnosis not present

## 2012-03-21 DIAGNOSIS — I2510 Atherosclerotic heart disease of native coronary artery without angina pectoris: Secondary | ICD-10-CM | POA: Diagnosis not present

## 2012-03-22 DIAGNOSIS — M6281 Muscle weakness (generalized): Secondary | ICD-10-CM | POA: Diagnosis not present

## 2012-03-22 DIAGNOSIS — Z4789 Encounter for other orthopedic aftercare: Secondary | ICD-10-CM | POA: Diagnosis not present

## 2012-03-22 DIAGNOSIS — M48061 Spinal stenosis, lumbar region without neurogenic claudication: Secondary | ICD-10-CM | POA: Diagnosis not present

## 2012-03-22 DIAGNOSIS — R269 Unspecified abnormalities of gait and mobility: Secondary | ICD-10-CM | POA: Diagnosis not present

## 2012-03-22 DIAGNOSIS — I2510 Atherosclerotic heart disease of native coronary artery without angina pectoris: Secondary | ICD-10-CM | POA: Diagnosis not present

## 2012-03-22 DIAGNOSIS — IMO0001 Reserved for inherently not codable concepts without codable children: Secondary | ICD-10-CM | POA: Diagnosis not present

## 2012-03-25 DIAGNOSIS — M6281 Muscle weakness (generalized): Secondary | ICD-10-CM | POA: Diagnosis not present

## 2012-03-25 DIAGNOSIS — M48061 Spinal stenosis, lumbar region without neurogenic claudication: Secondary | ICD-10-CM | POA: Diagnosis not present

## 2012-03-25 DIAGNOSIS — Z4789 Encounter for other orthopedic aftercare: Secondary | ICD-10-CM | POA: Diagnosis not present

## 2012-03-25 DIAGNOSIS — R269 Unspecified abnormalities of gait and mobility: Secondary | ICD-10-CM | POA: Diagnosis not present

## 2012-03-25 DIAGNOSIS — IMO0001 Reserved for inherently not codable concepts without codable children: Secondary | ICD-10-CM | POA: Diagnosis not present

## 2012-03-25 DIAGNOSIS — I2510 Atherosclerotic heart disease of native coronary artery without angina pectoris: Secondary | ICD-10-CM | POA: Diagnosis not present

## 2012-03-27 DIAGNOSIS — R269 Unspecified abnormalities of gait and mobility: Secondary | ICD-10-CM | POA: Diagnosis not present

## 2012-03-27 DIAGNOSIS — M48061 Spinal stenosis, lumbar region without neurogenic claudication: Secondary | ICD-10-CM | POA: Diagnosis not present

## 2012-03-27 DIAGNOSIS — M6281 Muscle weakness (generalized): Secondary | ICD-10-CM | POA: Diagnosis not present

## 2012-03-27 DIAGNOSIS — IMO0001 Reserved for inherently not codable concepts without codable children: Secondary | ICD-10-CM | POA: Diagnosis not present

## 2012-03-27 DIAGNOSIS — Z4789 Encounter for other orthopedic aftercare: Secondary | ICD-10-CM | POA: Diagnosis not present

## 2012-03-27 DIAGNOSIS — I2510 Atherosclerotic heart disease of native coronary artery without angina pectoris: Secondary | ICD-10-CM | POA: Diagnosis not present

## 2012-03-28 DIAGNOSIS — M48061 Spinal stenosis, lumbar region without neurogenic claudication: Secondary | ICD-10-CM | POA: Diagnosis not present

## 2012-03-28 DIAGNOSIS — M6281 Muscle weakness (generalized): Secondary | ICD-10-CM | POA: Diagnosis not present

## 2012-03-28 DIAGNOSIS — Z4789 Encounter for other orthopedic aftercare: Secondary | ICD-10-CM | POA: Diagnosis not present

## 2012-03-28 DIAGNOSIS — R269 Unspecified abnormalities of gait and mobility: Secondary | ICD-10-CM | POA: Diagnosis not present

## 2012-03-28 DIAGNOSIS — IMO0001 Reserved for inherently not codable concepts without codable children: Secondary | ICD-10-CM | POA: Diagnosis not present

## 2012-03-28 DIAGNOSIS — I2510 Atherosclerotic heart disease of native coronary artery without angina pectoris: Secondary | ICD-10-CM | POA: Diagnosis not present

## 2012-04-01 ENCOUNTER — Other Ambulatory Visit (HOSPITAL_COMMUNITY): Payer: Self-pay | Admitting: Neurosurgery

## 2012-04-01 DIAGNOSIS — M5410 Radiculopathy, site unspecified: Secondary | ICD-10-CM

## 2012-04-01 DIAGNOSIS — M549 Dorsalgia, unspecified: Secondary | ICD-10-CM

## 2012-04-02 DIAGNOSIS — M48061 Spinal stenosis, lumbar region without neurogenic claudication: Secondary | ICD-10-CM | POA: Diagnosis not present

## 2012-04-02 DIAGNOSIS — I10 Essential (primary) hypertension: Secondary | ICD-10-CM | POA: Diagnosis not present

## 2012-04-02 DIAGNOSIS — I2510 Atherosclerotic heart disease of native coronary artery without angina pectoris: Secondary | ICD-10-CM | POA: Diagnosis not present

## 2012-04-02 DIAGNOSIS — M545 Low back pain, unspecified: Secondary | ICD-10-CM | POA: Diagnosis not present

## 2012-04-02 DIAGNOSIS — R269 Unspecified abnormalities of gait and mobility: Secondary | ICD-10-CM | POA: Diagnosis not present

## 2012-04-02 DIAGNOSIS — Z7901 Long term (current) use of anticoagulants: Secondary | ICD-10-CM | POA: Diagnosis not present

## 2012-04-02 DIAGNOSIS — M1990 Unspecified osteoarthritis, unspecified site: Secondary | ICD-10-CM | POA: Diagnosis not present

## 2012-04-02 DIAGNOSIS — Z8701 Personal history of pneumonia (recurrent): Secondary | ICD-10-CM | POA: Diagnosis not present

## 2012-04-02 DIAGNOSIS — I82409 Acute embolism and thrombosis of unspecified deep veins of unspecified lower extremity: Secondary | ICD-10-CM | POA: Diagnosis not present

## 2012-04-02 DIAGNOSIS — Z5181 Encounter for therapeutic drug level monitoring: Secondary | ICD-10-CM | POA: Diagnosis not present

## 2012-04-03 ENCOUNTER — Ambulatory Visit (HOSPITAL_COMMUNITY)
Admission: RE | Admit: 2012-04-03 | Discharge: 2012-04-03 | Disposition: A | Discharge status: Home / Self Care | Payer: Medicare Other | Source: Ambulatory Visit | Attending: Neurosurgery | Admitting: Neurosurgery

## 2012-04-03 DIAGNOSIS — Z9889 Other specified postprocedural states: Secondary | ICD-10-CM | POA: Diagnosis not present

## 2012-04-03 DIAGNOSIS — M5126 Other intervertebral disc displacement, lumbar region: Secondary | ICD-10-CM | POA: Insufficient documentation

## 2012-04-03 DIAGNOSIS — M545 Low back pain, unspecified: Secondary | ICD-10-CM | POA: Diagnosis not present

## 2012-04-03 DIAGNOSIS — M48061 Spinal stenosis, lumbar region without neurogenic claudication: Secondary | ICD-10-CM | POA: Diagnosis not present

## 2012-04-03 DIAGNOSIS — M79609 Pain in unspecified limb: Secondary | ICD-10-CM | POA: Diagnosis not present

## 2012-04-03 DIAGNOSIS — Z7901 Long term (current) use of anticoagulants: Secondary | ICD-10-CM | POA: Diagnosis not present

## 2012-04-03 DIAGNOSIS — M549 Dorsalgia, unspecified: Secondary | ICD-10-CM

## 2012-04-03 DIAGNOSIS — T148XXA Other injury of unspecified body region, initial encounter: Secondary | ICD-10-CM | POA: Diagnosis not present

## 2012-04-03 DIAGNOSIS — M5410 Radiculopathy, site unspecified: Secondary | ICD-10-CM

## 2012-04-03 LAB — POCT I-STAT, CHEM 8
BUN: 34 mg/dL — ABNORMAL HIGH (ref 6–23)
Hemoglobin: 13.3 g/dL (ref 12.0–15.0)
Potassium: 5.2 mEq/L — ABNORMAL HIGH (ref 3.5–5.1)
Sodium: 139 mEq/L (ref 135–145)
TCO2: 24 mmol/L (ref 0–100)

## 2012-04-03 MED ORDER — GADOBENATE DIMEGLUMINE 529 MG/ML IV SOLN
8.0000 mL | Freq: Once | INTRAVENOUS | Status: AC | PRN
  Administered 2012-04-03: 1 mL via INTRAVENOUS

## 2012-04-05 DIAGNOSIS — I2510 Atherosclerotic heart disease of native coronary artery without angina pectoris: Secondary | ICD-10-CM | POA: Diagnosis not present

## 2012-04-05 DIAGNOSIS — I82409 Acute embolism and thrombosis of unspecified deep veins of unspecified lower extremity: Secondary | ICD-10-CM | POA: Diagnosis not present

## 2012-04-05 DIAGNOSIS — M48061 Spinal stenosis, lumbar region without neurogenic claudication: Secondary | ICD-10-CM | POA: Diagnosis not present

## 2012-04-05 DIAGNOSIS — M545 Low back pain, unspecified: Secondary | ICD-10-CM | POA: Diagnosis not present

## 2012-04-05 DIAGNOSIS — I10 Essential (primary) hypertension: Secondary | ICD-10-CM | POA: Diagnosis not present

## 2012-04-05 DIAGNOSIS — R269 Unspecified abnormalities of gait and mobility: Secondary | ICD-10-CM | POA: Diagnosis not present

## 2012-04-06 DIAGNOSIS — M545 Low back pain, unspecified: Secondary | ICD-10-CM | POA: Diagnosis not present

## 2012-04-06 DIAGNOSIS — R269 Unspecified abnormalities of gait and mobility: Secondary | ICD-10-CM | POA: Diagnosis not present

## 2012-04-06 DIAGNOSIS — I82409 Acute embolism and thrombosis of unspecified deep veins of unspecified lower extremity: Secondary | ICD-10-CM | POA: Diagnosis not present

## 2012-04-06 DIAGNOSIS — I2510 Atherosclerotic heart disease of native coronary artery without angina pectoris: Secondary | ICD-10-CM | POA: Diagnosis not present

## 2012-04-06 DIAGNOSIS — I10 Essential (primary) hypertension: Secondary | ICD-10-CM | POA: Diagnosis not present

## 2012-04-06 DIAGNOSIS — M48061 Spinal stenosis, lumbar region without neurogenic claudication: Secondary | ICD-10-CM | POA: Diagnosis not present

## 2012-04-07 DIAGNOSIS — M545 Low back pain, unspecified: Secondary | ICD-10-CM | POA: Diagnosis not present

## 2012-04-07 DIAGNOSIS — R269 Unspecified abnormalities of gait and mobility: Secondary | ICD-10-CM | POA: Diagnosis not present

## 2012-04-07 DIAGNOSIS — I82409 Acute embolism and thrombosis of unspecified deep veins of unspecified lower extremity: Secondary | ICD-10-CM | POA: Diagnosis not present

## 2012-04-07 DIAGNOSIS — I10 Essential (primary) hypertension: Secondary | ICD-10-CM | POA: Diagnosis not present

## 2012-04-07 DIAGNOSIS — M48061 Spinal stenosis, lumbar region without neurogenic claudication: Secondary | ICD-10-CM | POA: Diagnosis not present

## 2012-04-07 DIAGNOSIS — I2510 Atherosclerotic heart disease of native coronary artery without angina pectoris: Secondary | ICD-10-CM | POA: Diagnosis not present

## 2012-04-08 ENCOUNTER — Encounter (HOSPITAL_COMMUNITY): Payer: Self-pay | Admitting: Pharmacy Technician

## 2012-04-08 ENCOUNTER — Other Ambulatory Visit: Payer: Self-pay | Admitting: Neurosurgery

## 2012-04-08 DIAGNOSIS — I2510 Atherosclerotic heart disease of native coronary artery without angina pectoris: Secondary | ICD-10-CM | POA: Diagnosis not present

## 2012-04-08 DIAGNOSIS — M48061 Spinal stenosis, lumbar region without neurogenic claudication: Secondary | ICD-10-CM | POA: Diagnosis not present

## 2012-04-08 DIAGNOSIS — I10 Essential (primary) hypertension: Secondary | ICD-10-CM | POA: Diagnosis not present

## 2012-04-08 DIAGNOSIS — I82409 Acute embolism and thrombosis of unspecified deep veins of unspecified lower extremity: Secondary | ICD-10-CM | POA: Diagnosis not present

## 2012-04-08 DIAGNOSIS — M545 Low back pain, unspecified: Secondary | ICD-10-CM | POA: Diagnosis not present

## 2012-04-08 DIAGNOSIS — R269 Unspecified abnormalities of gait and mobility: Secondary | ICD-10-CM | POA: Diagnosis not present

## 2012-04-09 DIAGNOSIS — M545 Low back pain, unspecified: Secondary | ICD-10-CM | POA: Diagnosis not present

## 2012-04-09 DIAGNOSIS — I2510 Atherosclerotic heart disease of native coronary artery without angina pectoris: Secondary | ICD-10-CM | POA: Diagnosis not present

## 2012-04-09 DIAGNOSIS — I10 Essential (primary) hypertension: Secondary | ICD-10-CM | POA: Diagnosis not present

## 2012-04-09 DIAGNOSIS — M48061 Spinal stenosis, lumbar region without neurogenic claudication: Secondary | ICD-10-CM | POA: Diagnosis not present

## 2012-04-09 DIAGNOSIS — R269 Unspecified abnormalities of gait and mobility: Secondary | ICD-10-CM | POA: Diagnosis not present

## 2012-04-09 DIAGNOSIS — I82409 Acute embolism and thrombosis of unspecified deep veins of unspecified lower extremity: Secondary | ICD-10-CM | POA: Diagnosis not present

## 2012-04-10 DIAGNOSIS — R269 Unspecified abnormalities of gait and mobility: Secondary | ICD-10-CM | POA: Diagnosis not present

## 2012-04-10 DIAGNOSIS — I82409 Acute embolism and thrombosis of unspecified deep veins of unspecified lower extremity: Secondary | ICD-10-CM | POA: Diagnosis not present

## 2012-04-10 DIAGNOSIS — I10 Essential (primary) hypertension: Secondary | ICD-10-CM | POA: Diagnosis not present

## 2012-04-10 DIAGNOSIS — I2510 Atherosclerotic heart disease of native coronary artery without angina pectoris: Secondary | ICD-10-CM | POA: Diagnosis not present

## 2012-04-10 DIAGNOSIS — M545 Low back pain, unspecified: Secondary | ICD-10-CM | POA: Diagnosis not present

## 2012-04-10 DIAGNOSIS — M48061 Spinal stenosis, lumbar region without neurogenic claudication: Secondary | ICD-10-CM | POA: Diagnosis not present

## 2012-04-11 DIAGNOSIS — I10 Essential (primary) hypertension: Secondary | ICD-10-CM | POA: Diagnosis not present

## 2012-04-11 DIAGNOSIS — M545 Low back pain, unspecified: Secondary | ICD-10-CM | POA: Diagnosis not present

## 2012-04-11 DIAGNOSIS — R269 Unspecified abnormalities of gait and mobility: Secondary | ICD-10-CM | POA: Diagnosis not present

## 2012-04-11 DIAGNOSIS — I82409 Acute embolism and thrombosis of unspecified deep veins of unspecified lower extremity: Secondary | ICD-10-CM | POA: Diagnosis not present

## 2012-04-11 DIAGNOSIS — M48061 Spinal stenosis, lumbar region without neurogenic claudication: Secondary | ICD-10-CM | POA: Diagnosis not present

## 2012-04-11 DIAGNOSIS — I2510 Atherosclerotic heart disease of native coronary artery without angina pectoris: Secondary | ICD-10-CM | POA: Diagnosis not present

## 2012-04-12 ENCOUNTER — Encounter (HOSPITAL_COMMUNITY)
Admission: RE | Admit: 2012-04-12 | Discharge: 2012-04-12 | Disposition: A | Discharge status: Home / Self Care | Payer: Medicare Other | Source: Ambulatory Visit | Attending: Anesthesiology | Admitting: Anesthesiology

## 2012-04-12 ENCOUNTER — Encounter (HOSPITAL_COMMUNITY): Payer: Self-pay

## 2012-04-12 ENCOUNTER — Encounter (HOSPITAL_COMMUNITY)
Admission: RE | Admit: 2012-04-12 | Discharge: 2012-04-12 | Disposition: A | Discharge status: Home / Self Care | Payer: Medicare Other | Source: Ambulatory Visit | Attending: Neurosurgery | Admitting: Neurosurgery

## 2012-04-12 DIAGNOSIS — M48061 Spinal stenosis, lumbar region without neurogenic claudication: Secondary | ICD-10-CM | POA: Diagnosis not present

## 2012-04-12 DIAGNOSIS — R269 Unspecified abnormalities of gait and mobility: Secondary | ICD-10-CM | POA: Diagnosis not present

## 2012-04-12 DIAGNOSIS — J40 Bronchitis, not specified as acute or chronic: Secondary | ICD-10-CM | POA: Diagnosis not present

## 2012-04-12 DIAGNOSIS — J9819 Other pulmonary collapse: Secondary | ICD-10-CM | POA: Diagnosis not present

## 2012-04-12 DIAGNOSIS — Z01818 Encounter for other preprocedural examination: Secondary | ICD-10-CM | POA: Diagnosis not present

## 2012-04-12 DIAGNOSIS — M545 Low back pain, unspecified: Secondary | ICD-10-CM | POA: Diagnosis not present

## 2012-04-12 DIAGNOSIS — I82409 Acute embolism and thrombosis of unspecified deep veins of unspecified lower extremity: Secondary | ICD-10-CM | POA: Diagnosis not present

## 2012-04-12 DIAGNOSIS — I2510 Atherosclerotic heart disease of native coronary artery without angina pectoris: Secondary | ICD-10-CM | POA: Diagnosis not present

## 2012-04-12 DIAGNOSIS — I10 Essential (primary) hypertension: Secondary | ICD-10-CM | POA: Diagnosis not present

## 2012-04-12 HISTORY — DX: Malignant (primary) neoplasm, unspecified: C80.1

## 2012-04-12 HISTORY — DX: Pneumonia, unspecified organism: J18.9

## 2012-04-12 HISTORY — DX: Peripheral vascular disease, unspecified: I73.9

## 2012-04-12 LAB — SURGICAL PCR SCREEN
MRSA, PCR: NEGATIVE
Staphylococcus aureus: NEGATIVE

## 2012-04-12 LAB — BASIC METABOLIC PANEL
BUN: 22 mg/dL (ref 6–23)
CO2: 28 mEq/L (ref 19–32)
Chloride: 107 mEq/L (ref 96–112)
Creatinine, Ser: 1.79 mg/dL — ABNORMAL HIGH (ref 0.50–1.10)
GFR calc Af Amer: 31 mL/min — ABNORMAL LOW (ref >90)

## 2012-04-12 LAB — CBC
HCT: 35.9 % — ABNORMAL LOW (ref 36.0–46.0)
MCV: 97.8 fL (ref 78.0–100.0)
RDW: 14.8 % (ref 11.5–15.5)
WBC: 4.7 10*3/uL (ref 4.0–10.5)

## 2012-04-12 NOTE — Progress Notes (Signed)
Echo 10/12 ekg 12/13 new cxr done today due to pneumonia in dec 13. Also had dvt in dec dx lft  Femoral. Ebony Hail to consult

## 2012-04-12 NOTE — Pre-Procedure Instructions (Addendum)
Lisa Lucas  04/12/2012   Your procedure is scheduled on:  Tuesday, January 28th  Report to [facility] at 1100 AM.  Call this number if you have problems the morning of surgery: [phone]   Remember:   Do not eat food or drink liquids after midnight.    Take these medicines the morning of surgery with A SIP OF WATER: tylenol if needed, zofran if needed gabapentin  STOP coumadin per dr   Lazaro Arms not wear jewelry, make-up or nail polish.  Do not wear lotions, powders, or perfumes. You may not wear deodorant.  Do not shave 48 hours prior to surgery. Men may shave face and neck.  Do not bring valuables to the hospital.  Contacts, dentures or bridgework may not be worn into surgery.  Leave suitcase in the car. After surgery it may be brought to your room.  For patients admitted to the hospital, checkout time is 11:00 AM the day of  discharge.   Patients discharged the day of surgery will not be allowed to drive  Home. palimona benton [id number]    Special Instructions: Shower using CHG 2 nights before surgery and the night before surgery.  If you shower the day of surgery use CHG.  Use special wash - you have one bottle of CHG for all showers.  You should use approximately 1/3 of the bottle for each shower.   Please read over the following fact sheets that you were given: Pain Booklet, Coughing and Deep Breathing, MRSA Information and Surgical Site Infection Prevention

## 2012-04-13 DIAGNOSIS — M48061 Spinal stenosis, lumbar region without neurogenic claudication: Secondary | ICD-10-CM | POA: Diagnosis not present

## 2012-04-13 DIAGNOSIS — I82409 Acute embolism and thrombosis of unspecified deep veins of unspecified lower extremity: Secondary | ICD-10-CM | POA: Diagnosis not present

## 2012-04-13 DIAGNOSIS — M545 Low back pain, unspecified: Secondary | ICD-10-CM | POA: Diagnosis not present

## 2012-04-13 DIAGNOSIS — R269 Unspecified abnormalities of gait and mobility: Secondary | ICD-10-CM | POA: Diagnosis not present

## 2012-04-13 DIAGNOSIS — I2510 Atherosclerotic heart disease of native coronary artery without angina pectoris: Secondary | ICD-10-CM | POA: Diagnosis not present

## 2012-04-13 DIAGNOSIS — I10 Essential (primary) hypertension: Secondary | ICD-10-CM | POA: Diagnosis not present

## 2012-04-14 DIAGNOSIS — R269 Unspecified abnormalities of gait and mobility: Secondary | ICD-10-CM | POA: Diagnosis not present

## 2012-04-14 DIAGNOSIS — M545 Low back pain, unspecified: Secondary | ICD-10-CM | POA: Diagnosis not present

## 2012-04-14 DIAGNOSIS — I82409 Acute embolism and thrombosis of unspecified deep veins of unspecified lower extremity: Secondary | ICD-10-CM | POA: Diagnosis not present

## 2012-04-14 DIAGNOSIS — M48061 Spinal stenosis, lumbar region without neurogenic claudication: Secondary | ICD-10-CM | POA: Diagnosis not present

## 2012-04-14 DIAGNOSIS — I10 Essential (primary) hypertension: Secondary | ICD-10-CM | POA: Diagnosis not present

## 2012-04-14 DIAGNOSIS — I2510 Atherosclerotic heart disease of native coronary artery without angina pectoris: Secondary | ICD-10-CM | POA: Diagnosis not present

## 2012-04-14 NOTE — Consult Note (Addendum)
Anesthesia Consult:  Patient is a 77 year old female scheduled for redo left L4-5 diskectomy on 04/16/12 by Dr. Joya Salm.  Her previous diskectomy was on 01/30/12.  History includes recurrent DVT, most recently LLE 02/21/12, PE '12, LLL PNA 03/05/12, PAD s/p right iliac stenting and reported FFBG and right FPBG (Dr. Raynelle Jan), renal cancer s/p right partial nephrectomy '11, CKD (Dr. Clent Jacks), large bladder diverticulum, obesity, HTN, former smoker, osteoporosis, hypercholesterolemia.  For anesthesia history, she reports post-operative N/V and typically needs prolonged post-operative supplemental oxygen.  PCP is Dr. Delphina Cahill in [place].    As above patient was recently diagnosed with a LLE DVT and PNA in December 2013.  CTA was negative for acute PE.  She continues to have DOE with minimal exertion (ie walking from room to room) which started at the time of her diagnosis.  Dyspnea symptoms have not worsened, but haven't really improved since treatment.  She denies SOB at rest, fever, persistent cough.  She denies chest pain, pleuritic chest pain, orthopnea, PND. She has had LLE edema since diagnosed with a LLE DVT 02/21/12. Her oxygen saturations at PAT were 88% after minimal exertion, but up to 93% at rest.    EKG on 02/21/12 showed NSR.  Echo on 01/09/11 showed: - Left ventricle: The cavity size was normal. There was mild to moderate concentric hypertrophy. Systolic function was vigorous. The estimated ejection fraction was in the range of 65% to 70%. Wall motion was normal; there were no regional wall motion abnormalities. - Aortic valve: Mildly calcified annulus. - Right ventricle: The cavity size was normal. Wall thickness was mildly increased.  CXR on 04/13/11 showed increased bronchitic marks at the lung bases representing acute or chronic bronchitis.  CTA of the chest on 03/05/12 [facility]) was negative for acute PE.  There was a linear defect within the proximal right pulmonary  artery tree likely representing sequela of prior PE from 01/09/11.  LLL airspace disease concerning for aspiration pneumonitis/PNA, possible isodense renal mass arising from the anterior upper pole of the right kidney (only partially imaged) with abdominal MRI recommended for further evaluation, extensive atherosclerosis, including left main and 3V CAD.  Preoperative labs noted.  Cr 1.79.  H/H 11.2/35.9.  PT/INR 14.3/1.13, PTT 39.  Exam shows her heart with RRR, no murmur noted.  Question of faint wheeze on right anteriorly that quickly cleared, otherwise lung sounds were clear.  She has generalized edema of her LLE.  I spoke with Dr. Nevada Crane on 04/12/12 after 1700 to review patient's upcoming surgery, 03/05/12 chest CT results from [facility], symptoms of DOE with minimal exertion, 04/12/12 lab, CXR, and O2 sats results.  He was aware of upcoming surgery.  He saw patient within the past two weeks.  He started patient on a Lovenox bridge in anticipation of surgery.  He felt that as long as Dr. Joya Salm does not need to hold anticoagulation therapy (Lovenox or Heparin bridge while INR sub-therapeutic) for more than a day or two that patient would not need an IVC filter.  He did not recommend a pulmonology or cardiology consult pre-operatively and felt she was probably as "tuned up" as she could be for surgery. He thought deconditioning was also playing a part in her dyspnea.  (I have notified Jessica at Dr. Harley Hallmark office that Dr. Nevada Crane requests a post-operative Lovenox or Heparin bridge until her INR is therapeutic to be started within 24-48 hours after surgery otherwise he would recommend an IVC filter pre-operatively.  She will  review with Dr. Joya Salm and call me if any issues.)  Update: Dr. Joya Salm plans to start her on a Heparin gtt post-operatively.  On 04/12/12, I reviewed above with Anesthesiologist Dr. Ola Spurr, including CT results and recent history of PNA/DVT.  Since Dr. Nevada Crane is also aware of above and feels  patient is okay to proceed as long as she will not be off anticoagulation therapy for any prolonged amount of time, then will anticipate she can proceed.  However, she will be evaluated by her assigned anesthesiologist on the day of surgery to ensure no worsening or new signs/symptoms prior to proceeding.    Myra Gianotti, PA-C 04/15/12 [phone]

## 2012-04-15 ENCOUNTER — Encounter (HOSPITAL_COMMUNITY): Payer: Self-pay | Admitting: Vascular Surgery

## 2012-04-15 DIAGNOSIS — M48061 Spinal stenosis, lumbar region without neurogenic claudication: Secondary | ICD-10-CM | POA: Diagnosis not present

## 2012-04-15 DIAGNOSIS — I10 Essential (primary) hypertension: Secondary | ICD-10-CM | POA: Diagnosis not present

## 2012-04-15 DIAGNOSIS — R269 Unspecified abnormalities of gait and mobility: Secondary | ICD-10-CM | POA: Diagnosis not present

## 2012-04-15 DIAGNOSIS — M545 Low back pain, unspecified: Secondary | ICD-10-CM | POA: Diagnosis not present

## 2012-04-15 DIAGNOSIS — I82409 Acute embolism and thrombosis of unspecified deep veins of unspecified lower extremity: Secondary | ICD-10-CM | POA: Diagnosis not present

## 2012-04-15 DIAGNOSIS — I2510 Atherosclerotic heart disease of native coronary artery without angina pectoris: Secondary | ICD-10-CM | POA: Diagnosis not present

## 2012-04-15 MED ORDER — CEFAZOLIN SODIUM-DEXTROSE 2-3 GM-% IV SOLR
2.0000 g | INTRAVENOUS | Status: AC
  Administered 2012-04-16: 2 g via INTRAVENOUS
  Filled 2012-04-15: qty 50

## 2012-04-16 ENCOUNTER — Encounter (HOSPITAL_COMMUNITY): Payer: Self-pay | Admitting: *Deleted

## 2012-04-16 ENCOUNTER — Encounter (HOSPITAL_COMMUNITY): Payer: Self-pay | Admitting: Vascular Surgery

## 2012-04-16 ENCOUNTER — Encounter (HOSPITAL_COMMUNITY)
Admission: RE | Disposition: A | Discharge status: Skilled Nursing Facility | Payer: Self-pay | Source: Ambulatory Visit | Attending: Neurosurgery

## 2012-04-16 ENCOUNTER — Inpatient Hospital Stay (HOSPITAL_COMMUNITY): Payer: Medicare Other | Admitting: Vascular Surgery

## 2012-04-16 ENCOUNTER — Inpatient Hospital Stay (HOSPITAL_COMMUNITY)
Admission: RE | Admit: 2012-04-16 | Discharge: 2012-04-20 | DRG: 491 | Disposition: A | Discharge status: Skilled Nursing Facility | Payer: Medicare Other | Source: Ambulatory Visit | Attending: Neurosurgery | Admitting: Neurosurgery

## 2012-04-16 DIAGNOSIS — M810 Age-related osteoporosis without current pathological fracture: Secondary | ICD-10-CM | POA: Diagnosis present

## 2012-04-16 DIAGNOSIS — R262 Difficulty in walking, not elsewhere classified: Secondary | ICD-10-CM | POA: Diagnosis not present

## 2012-04-16 DIAGNOSIS — Z9889 Other specified postprocedural states: Secondary | ICD-10-CM | POA: Diagnosis not present

## 2012-04-16 DIAGNOSIS — Z86718 Personal history of other venous thrombosis and embolism: Secondary | ICD-10-CM | POA: Diagnosis not present

## 2012-04-16 DIAGNOSIS — M51379 Other intervertebral disc degeneration, lumbosacral region without mention of lumbar back pain or lower extremity pain: Secondary | ICD-10-CM | POA: Diagnosis present

## 2012-04-16 DIAGNOSIS — Z7901 Long term (current) use of anticoagulants: Secondary | ICD-10-CM

## 2012-04-16 DIAGNOSIS — M5126 Other intervertebral disc displacement, lumbar region: Secondary | ICD-10-CM | POA: Diagnosis not present

## 2012-04-16 DIAGNOSIS — Z5189 Encounter for other specified aftercare: Secondary | ICD-10-CM | POA: Diagnosis not present

## 2012-04-16 DIAGNOSIS — Z85528 Personal history of other malignant neoplasm of kidney: Secondary | ICD-10-CM | POA: Diagnosis not present

## 2012-04-16 DIAGNOSIS — R279 Unspecified lack of coordination: Secondary | ICD-10-CM | POA: Diagnosis not present

## 2012-04-16 DIAGNOSIS — I10 Essential (primary) hypertension: Secondary | ICD-10-CM | POA: Diagnosis not present

## 2012-04-16 DIAGNOSIS — E7800 Pure hypercholesterolemia, unspecified: Secondary | ICD-10-CM | POA: Diagnosis present

## 2012-04-16 DIAGNOSIS — Z79899 Other long term (current) drug therapy: Secondary | ICD-10-CM | POA: Diagnosis not present

## 2012-04-16 DIAGNOSIS — M5137 Other intervertebral disc degeneration, lumbosacral region: Secondary | ICD-10-CM | POA: Diagnosis present

## 2012-04-16 DIAGNOSIS — Z87891 Personal history of nicotine dependence: Secondary | ICD-10-CM

## 2012-04-16 DIAGNOSIS — Z86711 Personal history of pulmonary embolism: Secondary | ICD-10-CM | POA: Diagnosis not present

## 2012-04-16 DIAGNOSIS — Z01812 Encounter for preprocedural laboratory examination: Secondary | ICD-10-CM

## 2012-04-16 DIAGNOSIS — M6281 Muscle weakness (generalized): Secondary | ICD-10-CM | POA: Diagnosis not present

## 2012-04-16 DIAGNOSIS — M48061 Spinal stenosis, lumbar region without neurogenic claudication: Secondary | ICD-10-CM | POA: Diagnosis not present

## 2012-04-16 DIAGNOSIS — R52 Pain, unspecified: Secondary | ICD-10-CM | POA: Diagnosis not present

## 2012-04-16 DIAGNOSIS — M4800 Spinal stenosis, site unspecified: Secondary | ICD-10-CM | POA: Diagnosis not present

## 2012-04-16 HISTORY — PX: LUMBAR LAMINECTOMY/DECOMPRESSION MICRODISCECTOMY: SHX5026

## 2012-04-16 HISTORY — PX: LUMBAR LAMINECTOMY: SHX95

## 2012-04-16 LAB — CBC
HCT: 34.4 % — ABNORMAL LOW (ref 36.0–46.0)
Hemoglobin: 10.8 g/dL — ABNORMAL LOW (ref 12.0–15.0)
MCV: 98 fL (ref 78.0–100.0)
RBC: 3.51 MIL/uL — ABNORMAL LOW (ref 3.87–5.11)
RDW: 15 % (ref 11.5–15.5)
WBC: 5.2 10*3/uL (ref 4.0–10.5)

## 2012-04-16 LAB — CREATININE, SERUM: GFR calc Af Amer: 38 mL/min — ABNORMAL LOW (ref >90)

## 2012-04-16 SURGERY — LUMBAR LAMINECTOMY/DECOMPRESSION MICRODISCECTOMY 1 LEVEL
Anesthesia: General | Site: Back | Laterality: Left | Wound class: Clean

## 2012-04-16 MED ORDER — DIAZEPAM 5 MG PO TABS
5.0000 mg | ORAL_TABLET | Freq: Four times a day (QID) | ORAL | Status: DC | PRN
  Administered 2012-04-18 – 2012-04-20 (×5): 5 mg via ORAL
  Filled 2012-04-16 (×5): qty 1

## 2012-04-16 MED ORDER — FENTANYL CITRATE 0.05 MG/ML IJ SOLN
INTRAMUSCULAR | Status: DC | PRN
  Administered 2012-04-16: 100 ug via INTRAVENOUS

## 2012-04-16 MED ORDER — GLYCOPYRROLATE 0.2 MG/ML IJ SOLN
INTRAMUSCULAR | Status: DC | PRN
  Administered 2012-04-16: .9 mg via INTRAVENOUS

## 2012-04-16 MED ORDER — NALOXONE HCL 0.4 MG/ML IJ SOLN: 0.4000 mg | INTRAMUSCULAR | Status: DC | PRN

## 2012-04-16 MED ORDER — MENTHOL 3 MG MT LOZG
1.0000 | LOZENGE | OROMUCOSAL | Status: DC | PRN
  Filled 2012-04-16: qty 9

## 2012-04-16 MED ORDER — HYDROMORPHONE HCL PF 1 MG/ML IJ SOLN
INTRAMUSCULAR | Status: AC
  Filled 2012-04-16: qty 1

## 2012-04-16 MED ORDER — HEPARIN SODIUM (PORCINE) 5000 UNIT/ML IJ SOLN
5000.0000 [IU] | Freq: Three times a day (TID) | INTRAMUSCULAR | Status: DC
  Administered 2012-04-16 – 2012-04-19 (×9): 5000 [IU] via SUBCUTANEOUS
  Filled 2012-04-16 (×11): qty 1

## 2012-04-16 MED ORDER — HYDROMORPHONE 0.3 MG/ML IV SOLN
INTRAVENOUS | Status: AC
  Filled 2012-04-16: qty 25

## 2012-04-16 MED ORDER — SODIUM CHLORIDE 0.9 % IV SOLN: 250.0000 mL | INTRAVENOUS | Status: DC

## 2012-04-16 MED ORDER — ZOLPIDEM TARTRATE 5 MG PO TABS: 5.0000 mg | ORAL_TABLET | Freq: Every evening | ORAL | Status: DC | PRN

## 2012-04-16 MED ORDER — PROPOFOL 10 MG/ML IV BOLUS
INTRAVENOUS | Status: DC | PRN
  Administered 2012-04-16: 150 mg via INTRAVENOUS

## 2012-04-16 MED ORDER — ROCURONIUM BROMIDE 100 MG/10ML IV SOLN
INTRAVENOUS | Status: DC | PRN
  Administered 2012-04-16: 40 mg via INTRAVENOUS

## 2012-04-16 MED ORDER — HEMOSTATIC AGENTS (NO CHARGE) OPTIME
TOPICAL | Status: DC | PRN
  Administered 2012-04-16: 1 via TOPICAL

## 2012-04-16 MED ORDER — 0.9 % SODIUM CHLORIDE (POUR BTL) OPTIME
TOPICAL | Status: DC | PRN
  Administered 2012-04-16: 1000 mL

## 2012-04-16 MED ORDER — HYDROMORPHONE 0.3 MG/ML IV SOLN
INTRAVENOUS | Status: DC
Start: 2012-04-16 — End: 2012-04-18
  Administered 2012-04-16: 15:00:00 via INTRAVENOUS
  Administered 2012-04-16: 0.79 mg via INTRAVENOUS
  Administered 2012-04-17: 2 mg via INTRAVENOUS
  Administered 2012-04-17: 0.2 mg via INTRAVENOUS
  Administered 2012-04-17: 0.6 mg via INTRAVENOUS
  Administered 2012-04-17: 0.2 mg via INTRAVENOUS
  Administered 2012-04-17: 0.55 mg via INTRAVENOUS
  Administered 2012-04-17: 0.599 mg via INTRAVENOUS
  Administered 2012-04-18: 1.39 mg via INTRAVENOUS
  Administered 2012-04-18: 0.2 mg via INTRAVENOUS
  Administered 2012-04-18: 0.199 mg via INTRAVENOUS

## 2012-04-16 MED ORDER — PHENYLEPHRINE HCL 10 MG/ML IJ SOLN
INTRAMUSCULAR | Status: DC | PRN
  Administered 2012-04-16: 40 ug via INTRAVENOUS
  Administered 2012-04-16: 80 ug via INTRAVENOUS

## 2012-04-16 MED ORDER — DIPHENHYDRAMINE HCL 12.5 MG/5ML PO ELIX: 12.5000 mg | ORAL_SOLUTION | Freq: Four times a day (QID) | ORAL | Status: DC | PRN

## 2012-04-16 MED ORDER — SODIUM CHLORIDE 0.9 % IV SOLN
INTRAVENOUS | Status: DC
  Administered 2012-04-16: 17:00:00 via INTRAVENOUS

## 2012-04-16 MED ORDER — PHENOL 1.4 % MT LIQD: 1.0000 | OROMUCOSAL | Status: DC | PRN

## 2012-04-16 MED ORDER — LACTATED RINGERS IV SOLN
INTRAVENOUS | Status: DC | PRN
  Administered 2012-04-16 (×2): via INTRAVENOUS

## 2012-04-16 MED ORDER — ACETAMINOPHEN 325 MG PO TABS: 650.0000 mg | ORAL_TABLET | ORAL | Status: DC | PRN

## 2012-04-16 MED ORDER — ONDANSETRON HCL 4 MG/2ML IJ SOLN: 4.0000 mg | Freq: Four times a day (QID) | INTRAMUSCULAR | Status: DC | PRN

## 2012-04-16 MED ORDER — METHYLPREDNISOLONE ACETATE 80 MG/ML IJ SUSP
INTRAMUSCULAR | Status: DC | PRN
  Administered 2012-04-16: 80 mg

## 2012-04-16 MED ORDER — EPHEDRINE SULFATE 50 MG/ML IJ SOLN
INTRAMUSCULAR | Status: DC | PRN
  Administered 2012-04-16: 10 mg via INTRAVENOUS
  Administered 2012-04-16: 5 mg via INTRAVENOUS

## 2012-04-16 MED ORDER — LIDOCAINE HCL (CARDIAC) 20 MG/ML IV SOLN
INTRAVENOUS | Status: DC | PRN
  Administered 2012-04-16: 80 mg via INTRAVENOUS

## 2012-04-16 MED ORDER — HYDROMORPHONE HCL PF 1 MG/ML IJ SOLN
0.2500 mg | INTRAMUSCULAR | Status: DC | PRN
  Administered 2012-04-16 (×2): 0.5 mg via INTRAVENOUS

## 2012-04-16 MED ORDER — SODIUM CHLORIDE 0.9 % IJ SOLN: 3.0000 mL | INTRAMUSCULAR | Status: DC | PRN

## 2012-04-16 MED ORDER — ACETAMINOPHEN 650 MG RE SUPP: 650.0000 mg | RECTAL | Status: DC | PRN

## 2012-04-16 MED ORDER — DEXAMETHASONE SODIUM PHOSPHATE 4 MG/ML IJ SOLN
INTRAMUSCULAR | Status: DC | PRN
  Administered 2012-04-16: 8 mg via INTRAVENOUS

## 2012-04-16 MED ORDER — SODIUM CHLORIDE 0.9 % IJ SOLN: 9.0000 mL | INTRAMUSCULAR | Status: DC | PRN

## 2012-04-16 MED ORDER — FENTANYL CITRATE 0.05 MG/ML IJ SOLN
INTRAMUSCULAR | Status: AC
  Filled 2012-04-16: qty 2

## 2012-04-16 MED ORDER — THROMBIN 5000 UNITS EX KIT
PACK | CUTANEOUS | Status: DC | PRN
  Administered 2012-04-16 (×2): 5000 [IU] via TOPICAL

## 2012-04-16 MED ORDER — SCOPOLAMINE 1 MG/3DAYS TD PT72
MEDICATED_PATCH | TRANSDERMAL | Status: AC
  Filled 2012-04-16: qty 1

## 2012-04-16 MED ORDER — CEFAZOLIN SODIUM 1-5 GM-% IV SOLN
1.0000 g | Freq: Three times a day (TID) | INTRAVENOUS | Status: AC
  Administered 2012-04-16 – 2012-04-17 (×2): 1 g via INTRAVENOUS
  Filled 2012-04-16 (×2): qty 50

## 2012-04-16 MED ORDER — BUPIVACAINE LIPOSOME 1.3 % IJ SUSP
20.0000 mL | INTRAMUSCULAR | Status: AC
  Filled 2012-04-16: qty 20

## 2012-04-16 MED ORDER — BUPIVACAINE LIPOSOME 1.3 % IJ SUSP
INTRAMUSCULAR | Status: DC | PRN
  Administered 2012-04-16: 20 mL

## 2012-04-16 MED ORDER — ONDANSETRON HCL 4 MG/2ML IJ SOLN
INTRAMUSCULAR | Status: DC | PRN
  Administered 2012-04-16: 4 mg via INTRAVENOUS

## 2012-04-16 MED ORDER — ONDANSETRON HCL 4 MG/2ML IJ SOLN
4.0000 mg | INTRAMUSCULAR | Status: DC | PRN
Start: 2012-04-16 — End: 2012-04-20

## 2012-04-16 MED ORDER — DIPHENHYDRAMINE HCL 50 MG/ML IJ SOLN: 12.5000 mg | Freq: Four times a day (QID) | INTRAMUSCULAR | Status: DC | PRN

## 2012-04-16 MED ORDER — NEOSTIGMINE METHYLSULFATE 1 MG/ML IJ SOLN
INTRAMUSCULAR | Status: DC | PRN
  Administered 2012-04-16: 5 mg via INTRAVENOUS

## 2012-04-16 MED ORDER — SODIUM CHLORIDE 0.9 % IJ SOLN
3.0000 mL | Freq: Two times a day (BID) | INTRAMUSCULAR | Status: DC
  Administered 2012-04-17 – 2012-04-19 (×4): 3 mL via INTRAVENOUS

## 2012-04-16 MED ORDER — GABAPENTIN 300 MG PO CAPS
300.0000 mg | ORAL_CAPSULE | Freq: Three times a day (TID) | ORAL | Status: DC
  Administered 2012-04-16 – 2012-04-20 (×12): 300 mg via ORAL
  Filled 2012-04-16 (×15): qty 1

## 2012-04-16 SURGICAL SUPPLY — 53 items
BENZOIN TINCTURE PRP APPL 2/3 (GAUZE/BANDAGES/DRESSINGS) ×2 IMPLANT
BLADE SURG ROTATE 9660 (MISCELLANEOUS) IMPLANT
BUPIVACAINE HCI AND EPINEPHRINE INJECTION IMPLANT
BUR ACORN 6.0 (BURR) ×2 IMPLANT
BUR MATCHSTICK NEURO 3.0 LAGG (BURR) ×2 IMPLANT
CANISTER SUCTION 2500CC (MISCELLANEOUS) ×2 IMPLANT
CLOTH BEACON ORANGE TIMEOUT ST (SAFETY) ×2 IMPLANT
CONT SPEC 4OZ CLIKSEAL STRL BL (MISCELLANEOUS) ×2 IMPLANT
DRAPE LAPAROTOMY 100X72X124 (DRAPES) ×2 IMPLANT
DRAPE MICROSCOPE LEICA (MISCELLANEOUS) ×2 IMPLANT
DRAPE POUCH INSTRU U-SHP 10X18 (DRAPES) ×2 IMPLANT
DRSG PAD ABDOMINAL 8X10 ST (GAUZE/BANDAGES/DRESSINGS) IMPLANT
DURAPREP 26ML APPLICATOR (WOUND CARE) ×2 IMPLANT
ELECT REM PT RETURN 9FT ADLT (ELECTROSURGICAL) ×2
ELECTRODE REM PT RTRN 9FT ADLT (ELECTROSURGICAL) ×1 IMPLANT
GAUZE SPONGE 4X4 16PLY XRAY LF (GAUZE/BANDAGES/DRESSINGS) IMPLANT
GLOVE BIOGEL M 8.0 STRL (GLOVE) ×4 IMPLANT
GLOVE BIOGEL PI IND STRL 8 (GLOVE) ×2 IMPLANT
GLOVE BIOGEL PI INDICATOR 8 (GLOVE) ×2
GLOVE EXAM NITRILE LRG STRL (GLOVE) IMPLANT
GLOVE EXAM NITRILE MD LF STRL (GLOVE) IMPLANT
GLOVE EXAM NITRILE XL STR (GLOVE) IMPLANT
GLOVE EXAM NITRILE XS STR PU (GLOVE) IMPLANT
GLOVE SURG SS PI 8.0 STRL IVOR (GLOVE) ×2 IMPLANT
GOWN BRE IMP SLV AUR LG STRL (GOWN DISPOSABLE) ×4 IMPLANT
GOWN BRE IMP SLV AUR XL STRL (GOWN DISPOSABLE) IMPLANT
GOWN STRL REIN 2XL LVL4 (GOWN DISPOSABLE) ×4 IMPLANT
KIT BASIN OR (CUSTOM PROCEDURE TRAY) ×2 IMPLANT
KIT ROOM TURNOVER OR (KITS) ×2 IMPLANT
NEEDLE HYPO 18GX1.5 BLUNT FILL (NEEDLE) ×2 IMPLANT
NEEDLE HYPO 21X1.5 SAFETY (NEEDLE) ×2 IMPLANT
NEEDLE HYPO 25X1 1.5 SAFETY (NEEDLE) IMPLANT
NEEDLE SPNL 20GX3.5 QUINCKE YW (NEEDLE) IMPLANT
NS IRRIG 1000ML POUR BTL (IV SOLUTION) ×2 IMPLANT
PACK LAMINECTOMY NEURO (CUSTOM PROCEDURE TRAY) ×2 IMPLANT
PAD ARMBOARD 7.5X6 YLW CONV (MISCELLANEOUS) ×6 IMPLANT
PATTIES SURGICAL .5 X1 (DISPOSABLE) IMPLANT
RUBBERBAND STERILE (MISCELLANEOUS) ×4 IMPLANT
SPONGE GAUZE 4X4 12PLY (GAUZE/BANDAGES/DRESSINGS) ×2 IMPLANT
SPONGE LAP 4X18 X RAY DECT (DISPOSABLE) IMPLANT
SPONGE SURGIFOAM ABS GEL SZ50 (HEMOSTASIS) ×2 IMPLANT
STRIP CLOSURE SKIN 1/2X4 (GAUZE/BANDAGES/DRESSINGS) ×2 IMPLANT
SUT VIC AB 0 CT1 18XCR BRD8 (SUTURE) ×1 IMPLANT
SUT VIC AB 0 CT1 8-18 (SUTURE) ×1
SUT VIC AB 2-0 CP2 18 (SUTURE) ×2 IMPLANT
SUT VIC AB 3-0 SH 8-18 (SUTURE) ×2 IMPLANT
SYR 20CC LL (SYRINGE) ×2 IMPLANT
SYR 20ML ECCENTRIC (SYRINGE) ×2 IMPLANT
SYR 5ML LL (SYRINGE) ×2 IMPLANT
TAPE CLOTH SURG 4X10 WHT LF (GAUZE/BANDAGES/DRESSINGS) ×2 IMPLANT
TOWEL OR 17X24 6PK STRL BLUE (TOWEL DISPOSABLE) ×2 IMPLANT
TOWEL OR 17X26 10 PK STRL BLUE (TOWEL DISPOSABLE) ×2 IMPLANT
WATER STERILE IRR 1000ML POUR (IV SOLUTION) ×2 IMPLANT

## 2012-04-16 NOTE — H&P (Signed)
Lisa Lucas is an 77 y.o. female.   Chief Complaint: left leg pain HPI: patient who underwent left l45 didcectomy for a foot drop seen in my office on 02/19/12 she was doing better but later on she had increase of pain with more weakness. Pt and medication did not help. Mri was done which showed recurrent hnp   Past Medical History  Diagnosis Date  . Hypertension   . DVT (deep venous thrombosis) 12/2010    right lower extremity and PE but also had prior DVT and had been on coumadin but converted to plavix/asa due to "reaction" until she presented with another DVT/PE in 12/2010.;  LLE DVT 02/2012  . Hypercholesteremia   . Osteoporosis   . Pulmonary embolism 12/2010  . Spinal stenosis   . Arthritis   . Pneumonia     recent  1 month ago  . Peripheral vascular disease   . Cancer     kidney bil   . PONV (postoperative nausea and vomiting)     has difficulty breathing coming out of anesthesia,  . History of kidney cancer     Past Surgical History  Procedure Date  . Abdominal hysterectomy   . Hernia repair   . Appendectomy   . Kidney surgery 2010    bilateral renal cancer s/p bilateral partial nephrectomy  . Leg surgery     right leg for blood clot  . Colonoscopy 12/29/2011    Procedure: COLONOSCOPY;  Surgeon: Danie Binder, MD;  Location: [facility];  Service: Endoscopy;  Laterality: N/A;  11:30  . Lumbar laminectomy/decompression microdiscectomy 01/30/2012    Procedure: LUMBAR LAMINECTOMY/DECOMPRESSION MICRODISCECTOMY 1 LEVEL;  Surgeon: Floyce Stakes, MD;  Location: [facility];  Service: Neurosurgery;  Laterality: Left;  Left Lumbar Four-Five Diskectomy  . Back surgery     Family History  Problem Relation Age of Onset  . Colon cancer Daughter     age 68s  . Colon cancer Brother     age 46   Social History:  reports that she quit smoking about 16 months ago. Her smoking use included Cigarettes. She has a 15 pack-year smoking history. She does not have any  smokeless tobacco history on file. She reports that she does not drink alcohol or use illicit drugs.  Allergies:  Allergies  Allergen Reactions  . Hydrocodone Nausea Only  . Oxycodone Nausea Only    Medications Prior to Admission  Medication Sig Dispense Refill  . Calcium Carb-Cholecalciferol (CALCIUM 600/VITAMIN D3) 600-800 MG-UNIT TABS Take 1 tablet by mouth daily.      . diphenhydramine-acetaminophen (TYLENOL PM) 25-500 MG TABS Take 1 tablet by mouth daily as needed. Takes twice weekly as needed for sleep      . enoxaparin (LOVENOX) 120 MG/0.8ML injection Inject 120 mg into the skin daily.      Marland Kitchen gabapentin (NEURONTIN) 300 MG capsule Take 300 mg by mouth Three times a day.       Marland Kitchen guaiFENesin-codeine (ROBITUSSIN AC) 100-10 MG/5ML syrup Take 5 mLs by mouth 3 (three) times daily as needed. For cough      . HYDROmorphone (DILAUDID) 4 MG tablet Take 4 mg by mouth every 12 (twelve) hours as needed.      . Magnesium 250 MG TABS Take 1 tablet by mouth daily.      . ondansetron (ZOFRAN) 4 MG tablet Take 4 mg by mouth every 6 (six) hours as needed. nausea      . acetaminophen (TYLENOL) 500  MG tablet Take 1,000 mg by mouth every 6 (six) hours as needed. For pain      . warfarin (COUMADIN) 5 MG tablet Take 2.5-5 mg by mouth daily. *Take one-half tablet by mouth on Sundays and Tuesdays, then take one tablet on all other days        No results found for this or any previous visit (from the past 48 hour(s)). No results found.  Review of Systems  Constitutional: Negative.   HENT: Negative.   Eyes: Negative.   Respiratory: Negative.   Cardiovascular: Negative.   Gastrointestinal: Negative.   Musculoskeletal: Positive for back pain.  Skin: Negative.   Neurological: Positive for sensory change and focal weakness.  Endo/Heme/Allergies: Negative.   Psychiatric/Behavioral: Negative.     Blood pressure 155/83, pulse 80, temperature 97.6 F (36.4 C), temperature source Oral, resp. rate 18, SpO2  94.00%. Physical Exam  Patient came to my office on a wheel-chair. Hent,nl, neck,nl. Cv,nl. Lungs clear. Abvdomen,soft. Extremities, nl. NEURO 2-3/5 WEAKNESS OF LEFT FOOT. DECRESAE os densation al l5. Mri shows recurrent left l45 hnpadmitted for exploration and discsctomy at left l45. Patient and family aware of risks and benefits Assessment/Plan seee above  Jacinta Penalver M 04/16/2012, 11:24 AM

## 2012-04-16 NOTE — Preoperative (Signed)
Beta Blockers   Reason not to administer Beta Blockers:Not Applicable 

## 2012-04-16 NOTE — Transfer of Care (Signed)
Immediate Anesthesia Transfer of Care Note  Patient: Lisa Lucas  Procedure(s) Performed: Procedure(s) (LRB) with comments: LUMBAR LAMINECTOMY/DECOMPRESSION MICRODISCECTOMY 1 LEVEL (Left) - Left Lumbar four-five Redo Diskectomy  Patient Location: PACU  Anesthesia Type:General  Level of Consciousness: awake, alert  and oriented  Airway & Oxygen Therapy: Patient Spontanous Breathing and Patient connected to face mask oxygen  Post-op Assessment: Report given to PACU RN, Post -op Vital signs reviewed and stable and Patient moving all extremities  Post vital signs: Reviewed and stable  Complications: No apparent anesthesia complications

## 2012-04-16 NOTE — Anesthesia Preprocedure Evaluation (Signed)
Anesthesia Evaluation  Patient identified by MRN, date of birth, ID band Patient awake    Reviewed: Allergy & Precautions, H&P , NPO status , Patient's Chart, lab work & pertinent test results  History of Anesthesia Complications (+) PONV  Airway Mallampati: II  Neck ROM: full    Dental   Pulmonary  H/o DVT and PE         Cardiovascular hypertension, + Peripheral Vascular Disease and DVT     Neuro/Psych    GI/Hepatic   Endo/Other  obese  Renal/GU Renal InsufficiencyRenal diseaseH/o renal CA     Musculoskeletal  (+) Arthritis -,   Abdominal   Peds  Hematology   Anesthesia Other Findings   Reproductive/Obstetrics                           Anesthesia Physical Anesthesia Plan  ASA: III  Anesthesia Plan: General   Post-op Pain Management:    Induction: Intravenous  Airway Management Planned: Oral ETT  Additional Equipment:   Intra-op Plan:   Post-operative Plan: Extubation in OR  Informed Consent: I have reviewed the patients History and Physical, chart, labs and discussed the procedure including the risks, benefits and alternatives for the proposed anesthesia with the patient or authorized representative who has indicated his/her understanding and acceptance.     Plan Discussed with: CRNA and Surgeon  Anesthesia Plan Comments:         Anesthesia Quick Evaluation

## 2012-04-16 NOTE — Op Note (Signed)
NAMEJAELI, GOLDEN NO.:  [id number]  MEDICAL RECORD NO.:  [id number]  LOCATION:  [id number]                        FACILITY:  [facility]  PHYSICIAN:  Leeroy Cha, M.D.   DATE OF BIRTH:  [birth year]  DATE OF PROCEDURE:  04/16/2012 DATE OF DISCHARGE:                              OPERATIVE REPORT   PREOPERATIVE DIAGNOSIS:  Left L4-5 recurrent herniated disk with weakness of the left foot.  POSTOPERATIVE DIAGNOSIS:  Left L4-5 recurrent herniated disk with weakness of the left foot.  PROCEDURE:  Left L4-5 diskectomy, lysis of adhesions, decompression of the L4-L5 nerve root, as well as the thecal sac, microscope.  SURGEON:  Leeroy Cha, M.D.  ASSISTANT:  Faythe Ghee, M.D.  CLINICAL HISTORY:  Ms. Rieke is a lady who several months ago underwent surgery at L4-5 due to foot drop.  The patient did well and she was seen by me in December because she had minimal incisional pain.  Lately, she is getting worse with more weakness of the left foot.  MRI showed recurrent disk at L4-5.  Surgery was advised.  She and her family knew the risk of the surgery including possibility of further surgery, which would include fusion.  PROCEDURE:  The patient was taken to the OR.  After intubation, she was positioned on prone manner.  The back was cleaned with DuraPrep. Midline incision following the previous one was made.  Muscle was retracted laterally.  We were able to identify the L4-5 space.  We brought the microscope into the area.  We drilled more of the lamina of L4-L5.  Lysis of adhesion was accomplished.  We identified the thecal sac.  Right at the takeoff of the L5, there was a large herniated disk going laterally.  Incision was made and large amount of degenerative disk medial and laterally were removed.  At the end, we had thecal sac back in normal position with plenty of space of the L5 and S1 nerve roots.  Investigation of the foramen was negative.  Valsalva  maneuver up to 40 was negative.  Then, the area was irrigated.  Fentanyl and Depo- Medrol were left in the epidural space and the wound was closed with Vicryl and Steri-Strips.          ______________________________ Leeroy Cha, M.D.     EB/MEDQ  D:  04/16/2012  T:  04/16/2012  Job:  [id number]

## 2012-04-16 NOTE — Progress Notes (Signed)
Op note [phone]

## 2012-04-16 NOTE — Anesthesia Postprocedure Evaluation (Signed)
Anesthesia Post Note  Patient: [place]  Procedure(s) Performed: Procedure(s) (LRB): LUMBAR LAMINECTOMY/DECOMPRESSION MICRODISCECTOMY 1 LEVEL (Left)  Anesthesia type: General  Patient location: PACU  Post pain: Pain level controlled and Adequate analgesia  Post assessment: Post-op Vital signs reviewed, Patient's Cardiovascular Status Stable, Respiratory Function Stable, Patent Airway and Pain level controlled  Last Vitals:  Filed Vitals:   04/16/12 1412  BP: 145/78  Pulse: 87  Temp:   Resp: 19    Post vital signs: Reviewed and stable  Level of consciousness: awake, alert  and oriented  Complications: No apparent anesthesia complications

## 2012-04-17 ENCOUNTER — Encounter (HOSPITAL_COMMUNITY): Payer: Self-pay | Admitting: General Practice

## 2012-04-17 MED ORDER — DOCUSATE SODIUM 100 MG PO CAPS
100.0000 mg | ORAL_CAPSULE | Freq: Two times a day (BID) | ORAL | Status: DC
  Administered 2012-04-17 – 2012-04-20 (×6): 100 mg via ORAL
  Filled 2012-04-17 (×3): qty 1

## 2012-04-17 MED ORDER — SENNOSIDES-DOCUSATE SODIUM 8.6-50 MG PO TABS: 1.0000 | ORAL_TABLET | Freq: Every evening | ORAL | Status: DC | PRN

## 2012-04-17 NOTE — Evaluation (Addendum)
Occupational Therapy Evaluation Patient Details Name: [place] MRN: [id number] DOB: 1936/02/20 Today's Date: 04/17/2012 Time: [id number] OT Time Calculation (min): 30 min  (42min eval, 84min self care)  OT Assessment / Plan / Recommendation Clinical Impression  Pt currently requiring Min-Max assist w/ ADL's and self care tasks, as well as Max VC's & TC's for safety & adherence to back precautions. Pt also with fluctuating O2 Sats throughout session ranging from 70-90"s on 2L O2. Pt and pt's dtr state that other daughter will not be able to assist pt after d/c therefore SNF recommended at this time. Pt will benefit from acute OT to address above mentioned deficits.    OT Assessment  Patient needs continued OT Services    Follow Up Recommendations  SNF    Barriers to Discharge Decreased caregiver support Pt lives with Dtr whom will not be able to assist at d/c per pt and other dtr reports.  Equipment Recommendations  None recommended by OT    Recommendations for Other Services    Frequency  Min 3X/week    Precautions / Restrictions Precautions Precautions: Back Precaution Comments: Handout issued and reviewed w/ pt and pt's dtr Restrictions Weight Bearing Restrictions: No   Pertinent Vitals/Pain 7/10 low back and leg surgical pain, pt used PCA prior to session.    ADL  Eating/Feeding: Performed;Modified independent Where Assessed - Eating/Feeding: Bed level Grooming: Performed;Wash/dry hands;Minimal assistance;Moderate assistance Where Assessed - Grooming: Unsupported standing Upper Body Bathing: Simulated;Minimal assistance Where Assessed - Upper Body Bathing: Supported sitting Lower Body Bathing: Simulated;Maximal assistance Where Assessed - Lower Body Bathing: Supported sitting;Supported sit to stand Upper Body Dressing: Simulated;Minimal assistance Where Assessed - Upper Body Dressing: Supported sitting Lower Body Dressing: Simulated;Maximal assistance Where  Assessed - Lower Body Dressing: Supported sitting Toilet Transfer: Performed;Moderate assistance;Other (comment) (w/ Max VC's for back precautions, pt unable to adhere) Toilet Transfer Method: Sit to stand Toilet Transfer Equipment: Raised toilet seat with arms (or 3-in-1 over toilet);Other (comment) (Max VC's for back precautions) Toileting - Clothing Manipulation and Hygiene: Performed;Moderate assistance Where Assessed - Toileting Clothing Manipulation and Hygiene: Sit to stand from 3-in-1 or toilet Tub/Shower Transfer Method: Not assessed Equipment Used: Gait belt;Rolling walker Transfers/Ambulation Related to ADLs: Pt required consistent VC's and TC's for back precautions throughout assessment. Pt moves quickly and somewhat implusive, ? related to medications, cont to assess. ADL Comments: Pt currently requiring Min-Max assist w/ ADL's and self care tasks, as well as Max VC's & TC's for adherence to back precautions. Pt and pt's dtr state that other daughter will not be able to assist pt after d/c therefore SNF recommended at this time. Pt will benefit from acute OT     OT Diagnosis: Generalized weakness;Acute pain  OT Problem List: Decreased activity tolerance;Decreased safety awareness;Decreased knowledge of use of DME or AE;Decreased knowledge of precautions;Cardiopulmonary status limiting activity;Pain OT Treatment Interventions: Self-care/ADL training;Therapeutic activities;DME and/or AE instruction;Energy conservation;Patient/family education   OT Goals Acute Rehab OT Goals OT Goal Formulation: With patient/family Time For Goal Achievement: 05/01/12 Potential to Achieve Goals: Good ADL Goals Pt Will Perform Grooming: with supervision;Standing at sink ADL Goal: Grooming - Progress: Goal set today Pt Will Perform Upper Body Dressing: with set-up;with modified independence;Sitting, chair;Sitting, bed;Unsupported ADL Goal: Upper Body Dressing - Progress: Goal set today Pt Will Perform  Lower Body Dressing: with supervision;Sit to stand from chair;Sit to stand from bed;with adaptive equipment;Unsupported ADL Goal: Lower Body Dressing - Progress: Goal set today Pt Will Transfer to Toilet: with supervision;3-in-1;Maintaining  back safety precautions;Ambulation;with DME ADL Goal: Toilet Transfer - Progress: Goal set today Pt Will Perform Toileting - Clothing Manipulation: Sitting on 3-in-1 or toilet;with modified independence ADL Goal: Toileting - Clothing Manipulation - Progress: Goal set today Pt Will Perform Toileting - Hygiene: with modified independence;Sit to stand from 3-in-1/toilet ADL Goal: Toileting - Hygiene - Progress: Goal set today Additional ADL Goal #1: Pt will I state back precautions and demonstrate Mod I during ADL's and self care tasks w/ use of A/E PRN. ADL Goal: Additional Goal #1 - Progress: Goal set today  Visit Information  Last OT Received On: 04/17/12 Assistance Needed: +1 PT/OT Co-Evaluation/Treatment: Yes    Subjective Data  Subjective: Pt reports low back pain/surgical Patient Stated Goal: Return home (? SNF)   Prior Functioning     Home Living Lives With: Daughter;Other (Comment) (However dtr works full time & unable to assist) Available Help at Discharge: Other (Comment) (Pt/Dtr state dtr whom lives w/ pt unable to assist) Type of Home: House Home Access: Stairs to enter [employer] of Steps: 3 STE Home Layout: One level Bathroom Shower/Tub: Multimedia programmer: Standard Bathroom Accessibility: Yes How Accessible: Accessible via walker [place]: Bedside commode/3-in-1;Shower chair with back;Walker - four wheeled Additional Comments: Pt was I prior to this surgery and lives with dtr whom will be unable to assist pt secondary to working full time. 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 No hx of cataracts/glaucoma or macular degeneration   Cognition  Overall Cognitive Status: Appears within functional limits for tasks assessed/performed (Pt w/ difficulties follows commands, pt dtr ?meds) Arousal/Alertness: Lethargic Orientation Level: Appears intact for tasks assessed Behavior During Session: Other (comment) (Difficulty following commands, ?medication related) Cognition - Other Comments: Cont to assess Cognition as pt was with difficulty following commands and unable to adhere to back precautions (?medication related).    Extremity/Trunk Assessment Right Upper Extremity Assessment RUE ROM/Strength/Tone: Within functional levels RUE Sensation: WFL - Light Touch RUE Coordination: WFL - gross/fine motor Left Upper Extremity Assessment LUE ROM/Strength/Tone: Within functional levels LUE Sensation: WFL - Light Touch LUE Coordination: WFL - gross/fine motor     Mobility Bed Mobility Bed Mobility: Rolling Right;Right Sidelying to Sit Rolling Right: 4: Min assist Right Sidelying to Sit: 4: Min assist Transfers Transfers: Sit to Stand;Stand to Sit Sit to Stand: 2: Max assist;From bed;From toilet;3: Mod assist Stand to Sit: 3: Mod assist;To chair/3-in-1 Details for Transfer Assistance: Pt moves very quickly during sit-stand and stand-sit w/ difficulty following commands for safety and back precautions. Cont to assess.            Balance  Static standing = fair; dynamic sitting and standing poor as pt w/ difficulty following/adhering to back precautions   End of Session OT - End of Session Equipment Utilized During Treatment: Gait belt;Other (comment) (RW) Activity Tolerance: Patient tolerated treatment well Patient left: in chair;with call bell/phone within reach;with family/visitor present Nurse Communication: Mobility status  GO     Almyra Deforest 04/17/2012, 9:29  AM

## 2012-04-17 NOTE — Progress Notes (Signed)
Patient ID: [place], female   DOB: 1936/02/19, 77 y.o.   MRN: [id number] Foot stronger. complins of incisional pain. Asking about rehabilitation

## 2012-04-17 NOTE — Progress Notes (Signed)
Physical Therapy Evaluation Note  Past Medical History  Diagnosis Date  . Hypertension   . DVT (deep venous thrombosis) 12/2010    right lower extremity and PE but also had prior DVT and had been on coumadin but converted to plavix/asa due to "reaction" until she presented with another DVT/PE in 12/2010.;  LLE DVT 02/2012  . Hypercholesteremia   . Osteoporosis   . Pulmonary embolism 12/2010  . Spinal stenosis   . Arthritis   . Pneumonia     recent  1 month ago  . Peripheral vascular disease   . Cancer     kidney bil   . PONV (postoperative nausea and vomiting)     has difficulty breathing coming out of anesthesia,  . History of kidney cancer   . Shortness of breath    Past Surgical History  Procedure Date  . Abdominal hysterectomy   . Hernia repair   . Appendectomy   . Kidney surgery 2010    bilateral renal cancer s/p bilateral partial nephrectomy  . Leg surgery     right leg for blood clot  . Colonoscopy 12/29/2011    Procedure: COLONOSCOPY;  Surgeon: Danie Binder, MD;  Location: [facility];  Service: Endoscopy;  Laterality: N/A;  11:30  . Lumbar laminectomy/decompression microdiscectomy 01/30/2012    Procedure: LUMBAR LAMINECTOMY/DECOMPRESSION MICRODISCECTOMY 1 LEVEL;  Surgeon: Floyce Stakes, MD;  Location: [place] [facility];  Service: Neurosurgery;  Laterality: Left;  Left Lumbar Four-Five Diskectomy  . Back surgery   . Lumbar laminectomy 04/16/2012    Dr Joya Salm     04/17/12 0757  PT Visit Information  Last PT Received On 04/17/12  Assistance Needed +1  PT Time Calculation  PT Start Time 0757  PT Stop Time 0827  PT Time Calculation (min) 30 min  Subjective Data  Subjective Pt received supine in bed agreeable to PT. Dtr present  Precautions  Precautions Back  Precaution Booklet Issued Yes (comment)  Precaution Comments pt groggy from PCA pump but dtr with verbal understanding  Restrictions  Weight Bearing Restrictions No  Home Living  Lives With  Daughter;Other (Comment) (However dtr works full time & unable to assist)  Available Help at Discharge Other (Comment) (Pt/Dtr state dtr whom lives w/ pt unable to assist)  Type of [place] to enter  Entrance Stairs-Number of Steps 3 [place] One level  Engineer, manufacturing systems Yes  How Accessible Accessible via [place] Bedside commode/3-in-1;Shower chair with back;Walker - four wheeled  Additional Comments Pt was I prior to this surgery and lives with dtr whom will be unable to assist pt secondary to working full time.  Prior Function  Level of Independence Independent with assistive device(s)  Driving No  Vocation Retired  Engineer, petroleum No difficulties  Cognition  Overall Cognitive Status Appears within functional limits for tasks assessed/performed (Pt w/ difficulties follows commands, pt dtr ?meds)  Arousal/Alertness Lethargic  Orientation Level Appears intact for tasks assessed  Behavior During Session Other (comment) (Difficulty following commands, ?medication related)  Cognition - Other Comments Cont to assess Cognition as pt was with difficulty following commands and unable to adhere to back precautions (?medication related).  Right Upper Extremity Assessment  RUE ROM/Strength/Tone WFL  RUE Sensation WFL - Light Touch  RUE Coordination WFL - gross/fine motor  Left Upper Extremity Assessment  LUE ROM/Strength/Tone WFL  LUE Sensation WFL -  Light Touch  LUE Coordination WFL - gross/fine motor  Right Lower Extremity Assessment  RLE ROM/Strength/Tone WFL for tasks assessed  RLE Sensation WFL - Light Touch  Left Lower Extremity Assessment  LLE ROM/Strength/Tone Deficits;Due to pain (unable to tolerate manual resistance due to leg and back pai)  LLE Sensation WFL - Light Touch  Trunk Assessment  Trunk Assessment Normal  Bed Mobility  Bed  Mobility Right Sidelying to Sit;Rolling Right  Rolling Right 4: Min assist;With rail  Right Sidelying to Sit 3: Mod assist;With rails;HOB elevated  Details for Bed Mobility Assistance assist for trunk elevation and LE management off bed  Transfers  Transfers Sit to Stand;Stand to Sit  Sit to Stand 2: Max assist;With upper extremity assist;From bed  Stand to Sit 3: Mod assist;With upper extremity assist;With armrests;To chair/3-in-1  Details for Transfer Assistance maxA due to increased L LE pain with WBing causing difficulty transitioning hands from bed up to walker  Ambulation/Gait  Ambulation/Gait Assistance 4: Min assist  Ambulation Distance (Feet) 50 Feet  Assistive device Rolling walker  Ambulation/Gait Assistance Details constant v/c's to limit trunk flexion and twisting, antalgic gait, decreased L LE WBing, labored efforts  Gait Pattern Step-through pattern;Decreased step length - right;Decreased stance time - left;Decreased stride length  Gait velocity slow  Stairs No  PT - End of Session  Equipment Utilized During Treatment Gait belt;Oxygen (3Lo2 via [place], SPo2 in 80s)  Activity Tolerance Patient tolerated treatment well  Patient left in chair;with call bell/phone within reach;with family/visitor present  Nurse Communication Mobility status  PT Assessment  Clinical Impression Statement Pt is a 77yo female s/p L4-5 diskectomy presenting with surgical back pain and L LE pain requiring assist for all transfers, mobiltiy, and adls. Pt to strongly benefit from ST-SNF placement due to patient with no assist at home. Pt also demonstrating impaired memory and ability to follow commands however may be due to PCA pump. Pt to require 24/7 assist/supervision upon d/c which she does not have at home.  PT Recommendation/Assessment Patient needs continued PT services  PT Problem List Decreased strength;Decreased activity tolerance;Decreased balance;Decreased knowledge of precautions;Decreased  mobility  Barriers to Discharge Decreased caregiver support  Barriers to Discharge Comments pt daughter works all day and can not help  PT Therapy Diagnosis  Difficulty walking;Acute pain  PT Plan  PT Frequency Min 5X/week  PT Treatment/Interventions DME instruction;Gait training;Therapeutic activities;Therapeutic exercise;Balance training  PT Recommendation  Follow Up Recommendations SNF;Supervision/Assistance - 24 hour  PT equipment Rolling walker with 5" wheels  Individuals Consulted  Consulted and Agree with Results and Recommendations Patient;Family member/caregiver  Family Member Consulted daughter  Acute Rehab PT Goals  PT Goal Formulation With patient/family  Time For Goal Achievement 04/24/12  Potential to Achieve Goals Good  Pt will Roll Supine to Left Side with supervision;with rail  PT Goal: Rolling Supine to Left Side - Progress Goal set today  Pt will go Supine/Side to Sit with supervision;with HOB 0 degrees;with rail  PT Goal: Supine/Side to Sit - Progress Goal set today  Pt will go Sit to Stand with min assist;with upper extremity assist (up to RW)  PT Goal: Sit to Stand - Progress Goal set today  Pt will Ambulate 51 - 150 feet;with supervision;with rolling walker  PT Goal: Ambulate - Progress Goal set today  Pt will Go Up / Down Stairs 3-5 stairs;with min assist;with rail(s)  PT Goal: Up/Down Stairs - Progress Goal set today  Additional Goals  Additional Goal #1 Pt independent with  recall of 3/3 back precautions and 100% compliance.  PT Goal: Additional Goal #1 - Progress Goal set today  PT General Charges  $$ ACUTE PT VISIT 1 Procedure  PT Evaluation  $Initial PT Evaluation Tier I 1 Procedure  PT Treatments  $Gait Training 8-22 mins  $Therapeutic Activity 8-22 mins  Written Expression  Dominant Hand Right     Pain: 8/10 surgical back pain and L LE pain  Kittie Plater, PT, DPT Pager #: [phone] Office #: [phone]

## 2012-04-17 NOTE — Progress Notes (Signed)
Utilization review completed.  P.J. Melesa Lecy,RN,BSN Case Manager [phone]  

## 2012-04-18 ENCOUNTER — Encounter (HOSPITAL_COMMUNITY): Payer: Self-pay | Admitting: Neurosurgery

## 2012-04-18 MED ORDER — HYDROMORPHONE HCL 2 MG PO TABS
2.0000 mg | ORAL_TABLET | ORAL | Status: DC | PRN
  Administered 2012-04-18 – 2012-04-20 (×7): 2 mg via ORAL
  Filled 2012-04-18 (×7): qty 1

## 2012-04-18 NOTE — Progress Notes (Signed)
Physical medicine and rehabilitation consult was requested. Patient status post L4-5 discectomy 04/16/2012. Physical and occupational therapy evaluations completed. Recommendations are made for skilled nursing facility due to limited social support at home and family works. Patient does not meet medical necessity for inpatient rehabilitation services. Family has requested discharge a skilled facility closer to home [facility] and will discuss with case manager

## 2012-04-18 NOTE — Clinical Social Work Psychosocial (Signed)
     Clinical Social Work Department BRIEF PSYCHOSOCIAL ASSESSMENT 04/18/2012  Patient:  Lisa Lucas, Lisa Lucas     Account Number:  [id number]     Admit date:  04/16/2012  Clinical Social Worker:  [place]  Date/Time:  04/18/2012 05:07 PM  Referred by:  Physician  Date Referred:  04/17/2012 Referred for  SNF Placement   Other Referral:   Interview type:  Family Other interview type:    PSYCHOSOCIAL DATA Living Status:  ALONE Admitted from facility:   Level of care:   Primary support name:  [place] Primary support relationship to patient:  CHILD, ADULT Degree of support available:    CURRENT CONCERNS Current Concerns  Post-Acute Placement   Other Concerns:    SOCIAL WORK ASSESSMENT / PLAN CSW met with pt and family to address cosnult. CSW introduced herself and explained role of social work. CSW also explained process of discharging to SNF.    Pt lives alone, and PT is recommending ST-SNF at discharge. Pt and family are agreeable to this. Pt's daughter lives in [place], however pt has family in [place] where she lives.    CSW will initiate SNF search in [place] and follow up with bed offers. CSW will continue to follow.   Assessment/plan status:  Psychosocial Support/Ongoing Assessment of Needs Other assessment/ plan:   Information/referral to community resources:   SNF list    PATIENTS/FAMILYS RESPONSE TO PLAN OF CARE: Pt was alert and oriented. Both pt and daughter were very pleasant and agreeable to discharge plan.

## 2012-04-18 NOTE — Progress Notes (Signed)
Patient ID: [place], female   DOB: Jul 15, 1935, 77 y.o.   MRN: [id number] Better, some incisional pain. Plan to be transfer to a snf close to home

## 2012-04-18 NOTE — Progress Notes (Signed)
Physical Therapy Treatment Patient Details Name: Lisa Lucas MRN: [id number] DOB: 01/10/1936 Today's Date: 04/18/2012 Time: [id number] PT Time Calculation (min): 23 min  PT Assessment / Plan / Recommendation Comments on Treatment Session  Pt progressing with PT goals & mobility at this date.      Follow Up Recommendations  SNF;Supervision/Assistance - 24 hour     Does the patient have the potential to tolerate intense rehabilitation     Barriers to Discharge        Equipment Recommendations  Rolling walker with 5" wheels    Recommendations for Other Services    Frequency Min 5X/week   Plan Discharge plan remains appropriate    Precautions / Restrictions Precautions Precautions: Back Precaution Comments: Reviewed 3/3 back precautions Restrictions Weight Bearing Restrictions: No   Pertinent Vitals/Pain 9/10  L LE after ambulation.      Mobility  Bed Mobility Bed Mobility: Not assessed Transfers Transfers: Sit to Stand;Stand to Sit Sit to Stand: 4: Min assist;With upper extremity assist;With armrests;From chair/3-in-1 Stand to Sit: 4: Min assist;With upper extremity assist;With armrests;To chair/3-in-1 Details for Transfer Assistance: Cues for safe hand placement & technique.  (A) to achieve standing, balance, & controlled descent.   Ambulation/Gait Ambulation/Gait Assistance: 4: Min assist Ambulation Distance (Feet): 100 Feet Assistive device: Rolling walker Ambulation/Gait Assistance Details: Cues for tall posture, body positioning inside RW, use of UE's to assist with stability due to L LE weakness/pain.   Gait Pattern: Step-through pattern;Decreased stride length;Decreased step length - right;Decreased step length - left;Decreased hip/knee flexion - left;Antalgic;Trunk flexed Stairs: No Wheelchair Mobility Wheelchair Mobility: No      PT Goals Acute Rehab PT Goals Time For Goal Achievement: 04/24/12 Potential to Achieve Goals: Good Pt will Roll Supine  to Left Side: with supervision;with rail Pt will go Supine/Side to Sit: with supervision;with HOB 0 degrees;with rail Pt will go Sit to Stand: with min assist;with upper extremity assist PT Goal: Sit to Stand - Progress: Progressing toward goal Pt will Ambulate: 51 - 150 feet;with supervision;with rolling walker PT Goal: Ambulate - Progress: Progressing toward goal Pt will Go Up / Down Stairs: 3-5 stairs;with min assist;with rail(s) Additional Goals Additional Goal #1: Pt independent with recall of 3/3 back precautions and 100% compliance. PT Goal: Additional Goal #1 - Progress: Progressing toward goal  Visit Information  Last PT Received On: 04/18/12 Assistance Needed: +1    Subjective Data      Cognition  Overall Cognitive Status: Appears within functional limits for tasks assessed/performed Arousal/Alertness: Awake/alert Orientation Level: Appears intact for tasks assessed Behavior During Session: [facility] for tasks performed    Balance     End of Session PT - End of Session Equipment Utilized During Treatment: Gait belt Activity Tolerance: Patient tolerated treatment well Patient left: in chair;with call bell/phone within reach;with family/visitor present Nurse Communication: Mobility status     Sarajane Marek, [place] [phone] 04/18/2012

## 2012-04-18 NOTE — Clinical Social Work Placement (Addendum)
    Clinical Social Work Department CLINICAL SOCIAL WORK PLACEMENT NOTE 04/18/2012  Patient:  [place], [place]  Account Number:  [id number] Admit date:  04/16/2012  Clinical Social Worker:  Lehman Prom  Date/time:  04/18/2012 05:12 PM  Clinical Social Work is seeking post-discharge placement for this patient at the following level of care:   SKILLED NURSING   (*CSW will update this form in Epic as items are completed)   04/18/2012  Patient/family provided with [place] Department of Clinical Social Work's list of facilities offering this level of care within the geographic area requested by the patient (or if unable, by the patient's family).  04/18/2012  Patient/family informed of their freedom to choose among providers that offer the needed level of care, that participate in Medicare, Medicaid or managed care program needed by the patient, have an available bed and are willing to accept the patient.  04/18/2012  Patient/family informed of [facility]' ownership interest in [facility], as well as of the fact that they are under no obligation to receive care at this facility.  PASARR submitted to EDS on 04/19/2012 PASARR number received from EDS on 04/19/2012  FL2 transmitted to all facilities in geographic area requested by pt/family on  04/19/2012 FL2 transmitted to all facilities within larger geographic area on   Patient informed that his/her managed care company has contracts with or will negotiate with  certain facilities, including the following:     Patient/family informed of bed offers received:  04/19/2012 Patient chooses bed at [facility] Physician recommends and patient chooses bed at  SNF  Patient to be transferred to [facility] on 04/19/2012 Patient to be transferred to facility by [facility]  The following physician request were entered in Epic:   Additional Comments:

## 2012-04-19 DIAGNOSIS — Z86718 Personal history of other venous thrombosis and embolism: Secondary | ICD-10-CM

## 2012-04-19 DIAGNOSIS — M5126 Other intervertebral disc displacement, lumbar region: Secondary | ICD-10-CM | POA: Diagnosis present

## 2012-04-19 DIAGNOSIS — Z9889 Other specified postprocedural states: Secondary | ICD-10-CM

## 2012-04-19 DIAGNOSIS — Z7901 Long term (current) use of anticoagulants: Secondary | ICD-10-CM

## 2012-04-19 LAB — PROTIME-INR: Prothrombin Time: 12.7 seconds (ref 11.6–15.2)

## 2012-04-19 MED ORDER — FLEET ENEMA 7-19 GM/118ML RE ENEM
1.0000 | ENEMA | Freq: Every day | RECTAL | Status: DC | PRN
  Administered 2012-04-19: 1 via RECTAL
  Filled 2012-04-19: qty 1

## 2012-04-19 MED ORDER — WARFARIN - PHARMACIST DOSING INPATIENT: Freq: Every day | Status: DC

## 2012-04-19 MED ORDER — ALBUTEROL SULFATE (5 MG/ML) 0.5% IN NEBU
2.5000 mg | INHALATION_SOLUTION | RESPIRATORY_TRACT | Status: DC | PRN
  Administered 2012-04-19 – 2012-04-20 (×2): 2.5 mg via RESPIRATORY_TRACT
  Filled 2012-04-19 (×2): qty 0.5

## 2012-04-19 MED ORDER — ENOXAPARIN SODIUM 80 MG/0.8ML ~~LOC~~ SOLN
80.0000 mg | Freq: Two times a day (BID) | SUBCUTANEOUS | Status: DC
  Administered 2012-04-19 – 2012-04-20 (×2): 80 mg via SUBCUTANEOUS
  Filled 2012-04-19 (×5): qty 0.8

## 2012-04-19 MED ORDER — WARFARIN SODIUM 7.5 MG PO TABS
7.5000 mg | ORAL_TABLET | Freq: Once | ORAL | Status: AC
  Administered 2012-04-19: 7.5 mg via ORAL
  Filled 2012-04-19 (×2): qty 1

## 2012-04-19 MED ORDER — BISACODYL 10 MG RE SUPP
10.0000 mg | Freq: Once | RECTAL | Status: AC
  Administered 2012-04-19: 10 mg via RECTAL
  Filled 2012-04-19: qty 1

## 2012-04-19 NOTE — Consult Note (Signed)
Requesting physician: Dr. Ellene Route  Primary Care Physician: Wende Neighbors, MD  Reason for consultation: Family with multiple questions regarding DC   History of Present Illness: Pleasant 77 y/o woman who was admitted on 04/16/12 for an L4-5 herniated disk that required surgical decompression on 1/28. She was discharged home today, but patient's 2 daughters had a lot of questions her DC meds. Neurosurgery has cancelled the DC and has asked [place] to see her. Family's main concern is that she has a recent LLE DVT (12/13), and has not been on anticoagulation while in the hospital, because of surgery, and was to be discharged today on coumadin without a heparin bridge. They also are concerned about the high dose of dilaudid that was prescribed on DC (4 mg) instead of the 2 that she has been taking in the hospital as they state it causes her to become delirious. They also want her valium restarted at DC. Finally, she has some wheezes.  Allergies:   Allergies  Allergen Reactions  . Hydrocodone Nausea Only  . Oxycodone Nausea Only      Past Medical History  Diagnosis Date  . Hypertension   . DVT (deep venous thrombosis) 12/2010    right lower extremity and PE but also had prior DVT and had been on coumadin but converted to plavix/asa due to "reaction" until she presented with another DVT/PE in 12/2010.;  LLE DVT 02/2012  . Hypercholesteremia   . Osteoporosis   . Pulmonary embolism 12/2010  . Spinal stenosis   . Arthritis   . Pneumonia     recent  1 month ago  . Peripheral vascular disease   . Cancer     kidney bil   . PONV (postoperative nausea and vomiting)     has difficulty breathing coming out of anesthesia,  . History of kidney cancer   . Shortness of breath     Past Surgical History  Procedure Date  . Abdominal hysterectomy   . Hernia repair   . Appendectomy   . Kidney surgery 2010    bilateral renal cancer s/p bilateral partial nephrectomy  . Leg surgery     right leg for blood  clot  . Colonoscopy 12/29/2011    Procedure: COLONOSCOPY;  Surgeon: Danie Binder, MD;  Location: [facility];  Service: Endoscopy;  Laterality: N/A;  11:30  . Lumbar laminectomy/decompression microdiscectomy 01/30/2012    Procedure: LUMBAR LAMINECTOMY/DECOMPRESSION MICRODISCECTOMY 1 LEVEL;  Surgeon: Floyce Stakes, MD;  Location: [place] [facility];  Service: Neurosurgery;  Laterality: Left;  Left Lumbar Four-Five Diskectomy  . Back surgery   . Lumbar laminectomy 04/16/2012    Dr Joya Salm  . Lumbar laminectomy/decompression microdiscectomy 04/16/2012    Procedure: LUMBAR LAMINECTOMY/DECOMPRESSION MICRODISCECTOMY 1 LEVEL;  Surgeon: Floyce Stakes, MD;  Location: [place] [facility];  Service: Neurosurgery;  Laterality: Left;  Left Lumbar four-five Redo Diskectomy    Scheduled Meds:   . docusate sodium  100 mg Oral BID  . gabapentin  300 mg Oral TID  . heparin subcutaneous  5,000 Units Subcutaneous Q8H  . sodium chloride  3 mL Intravenous Q12H   Continuous Infusions:   . sodium chloride    . sodium chloride 75 mL/hr at 04/16/12 1728   PRN Meds:.acetaminophen, acetaminophen, albuterol, diazepam, HYDROmorphone, menthol-cetylpyridinium, ondansetron (ZOFRAN) IV, phenol, senna-docusate, sodium chloride, sodium phosphate, zolpidem  Social History:  reports that she quit smoking about 16 months ago. Her smoking use included Cigarettes. She has a 15 pack-year smoking history. She has never used  smokeless tobacco. She reports that she does not drink alcohol or use illicit drugs.  Family History  Problem Relation Age of Onset  . Colon cancer Daughter     age 40s  . Colon cancer Brother     age 42    Review of Systems:  Constitutional: Denies fever, chills, diaphoresis, appetite change and fatigue.  HEENT: Denies photophobia, eye pain, redness, hearing loss, ear pain, congestion, sore throat, rhinorrhea, sneezing, mouth sores, trouble swallowing, neck pain, neck stiffness and tinnitus.    Respiratory: Denies SOB, DOE, cough, chest tightness,  and wheezing.   Cardiovascular: Denies chest pain, palpitations and leg swelling.  Gastrointestinal: Denies nausea, vomiting, abdominal pain, diarrhea, constipation, blood in stool and abdominal distention.  Genitourinary: Denies dysuria, urgency, frequency, hematuria, flank pain and difficulty urinating.  Musculoskeletal: Denies myalgias, back pain, joint swelling, arthralgias and gait problem.  Skin: Denies pallor, rash and wound.  Neurological: Denies dizziness, seizures, syncope, weakness, light-headedness, numbness and headaches.  Hematological: Denies adenopathy. Easy bruising, personal or family bleeding history  Psychiatric/Behavioral: Denies suicidal ideation, mood changes, confusion, nervousness, sleep disturbance and agitation   Physical Exam: Blood pressure 150/75, pulse 90, temperature 98.2 F (36.8 C), temperature source Oral, resp. rate 18, height 5\' 5"  (1.651 m), weight 86.4 kg (190 lb 7.6 oz), SpO2 97.00%. Gen: AA Ox3, NAD Neck: supple, no JVD, no LAD, no bruits, no goiter.  HEENT: NS, AT, PERRL,Wears corrective lenses CV: RRR, no M/R/G Lungs: Faint bilateral expiratory wheezes Abd: S/NT/ND/+BS/no masses or organo,egaly. Ext: 1+ edema to LLE Neuro: grossly intact and non-focal.  Labs on Admission:  No results found for this or any previous visit (from the past 48 hour(s)).  Radiological Exams on Admission: No results found.  Assessment/Plan  LLE DVT -Agree with restarting coumadin, but we also need to bridge until her INR is therapeutic. -Will ask pharmacy to dose coumadin and lovenox.  Wheezing -She is a prior smoker and asthmatic. -Has some faint wheezing. -Will order PRN albuterol nebs.  Medication concerns -Would recommend decreasing dilaudid to 2 mg at time of DC instead of 4 mg. -Also do not see a contraindication for her to receive valium 5 mg PO q 8hrs PRn for anxiety/agitation at time of  DC.  Thank you for this consult. We will not plan on seeing this patient further, but please call us back with questions. She should be ok to DC to SNF in am unless any overnight events.   Time Spent on Consultation: 75 minutes  [facility]  [phone] 04/19/2012, 6:30 PM

## 2012-04-19 NOTE — Clinical Social Work Note (Signed)
Clinical Social Work   Pt's discharge delayed. CSW updated facility and MD. Weekend CSW will facilitate discharge to [facility] on 04/20/2012.   Darden Dates, MSW, [place]

## 2012-04-19 NOTE — Progress Notes (Signed)
ANTICOAGULATION CONSULT NOTE - Initial Consult  Pharmacy Consult for Lovenox and Coumadin Indication: pulmonary embolus and DVT (history)  Allergies  Allergen Reactions  . Hydrocodone Nausea Only  . Oxycodone Nausea Only    Patient Measurements: Height: 5\' 5"  (165.1 cm) Weight: 190 lb 7.6 oz (86.4 kg) IBW/kg (Calculated) : 57   Vital Signs: Temp: 98.2 F (36.8 C) (01/31 1800) Temp src: Oral (01/31 1800) BP: 150/75 mmHg (01/31 1800) Pulse Rate: 90  (01/31 1800)  Labs: No results found for this basename: HGB:2,HCT:3,PLT:3,APTT:3,LABPROT:3,INR:3,HEPARINUNFRC:3,CREATININE:3,CKTOTAL:3,CKMB:3,TROPONINI:3 in the last 72 hours  Estimated Creatinine Clearance: 35.7 ml/min (by C-G formula based on Cr of 1.5).   Medical History: Past Medical History  Diagnosis Date  . Hypertension   . DVT (deep venous thrombosis) 12/2010    right lower extremity and PE but also had prior DVT and had been on coumadin but converted to plavix/asa due to "reaction" until she presented with another DVT/PE in 12/2010.;  LLE DVT 02/2012  . Hypercholesteremia   . Osteoporosis   . Pulmonary embolism 12/2010  . Spinal stenosis   . Arthritis   . Pneumonia     recent  1 month ago  . Peripheral vascular disease   . Cancer     kidney bil   . PONV (postoperative nausea and vomiting)     has difficulty breathing coming out of anesthesia,  . History of kidney cancer   . Shortness of breath     Medications:  Prescriptions prior to admission  Medication Sig Dispense Refill  . Calcium Carb-Cholecalciferol (CALCIUM 600/VITAMIN D3) 600-800 MG-UNIT TABS Take 1 tablet by mouth daily.      . diphenhydramine-acetaminophen (TYLENOL PM) 25-500 MG TABS Take 1 tablet by mouth daily as needed. Takes twice weekly as needed for sleep      . gabapentin (NEURONTIN) 300 MG capsule Take 300 mg by mouth Three times a day.       Marland Kitchen guaiFENesin-codeine (ROBITUSSIN AC) 100-10 MG/5ML syrup Take 5 mLs by mouth 3 (three) times  daily as needed. For cough      . HYDROmorphone (DILAUDID) 4 MG tablet Take 4 mg by mouth every 12 (twelve) hours as needed.      . Magnesium 250 MG TABS Take 1 tablet by mouth daily.      . ondansetron (ZOFRAN) 4 MG tablet Take 4 mg by mouth every 6 (six) hours as needed. nausea      . [DISCONTINUED] enoxaparin (LOVENOX) 120 MG/0.8ML injection Inject 120 mg into the skin daily.      Marland Kitchen acetaminophen (TYLENOL) 500 MG tablet Take 1,000 mg by mouth every 6 (six) hours as needed. For pain      . warfarin (COUMADIN) 5 MG tablet Take 2.5-5 mg by mouth daily. *Take one-half tablet by mouth on Sundays and Tuesdays, then take one tablet on all other days        Assessment: 77 yo F on Coumadin PTA for hx multiple VTEs.  Pt was bridged with Lovenox 120 mg SQ daily while off Coumadin for planned back surgery.  Patient is s/p L4-5 diskectomy and nerve decompression on 04/17/11.  Pharmacy has been asked to restart anticoagulation at this time.  Home Coumadin regimen = 5mg  daily except 2.5 mg on Sunday and Tuesday.  Baseline INR pending.  Expect INR ~ 1 since last Coumadin dose was 04/05/12.  Goal of Therapy:  INR 2-3 Monitor platelets by anticoagulation protocol: Yes    Plan:  While Lovenox 120mg  SQ daily  is more convenient for administration, I am hesitant to dose at 1.5 mg/kg/day in this patient 2/2 age, borderline renal function, and recent surgery.  Will start Lovenox 80 mg SQ q12h.  Can transition to Lovenox 120mg  SQ daily at discharge if desired.  Coumadin 7.5mg  PO x 1 tonight.  Daily INR.  [employer], Pharm.D., BCPS Clinical Pharmacist Pager [phone] 04/19/2012 7:06 PM

## 2012-04-19 NOTE — Discharge Summary (Signed)
Physician Discharge Summary  Patient ID: Lisa Lucas MRN: [id number] DOB/AGE: 08/05/1935 77 y.o.  Admit date: 04/16/2012 Discharge date: 04/19/2012  Admission Diagnoses:recurrent l45 hnp  Discharge Diagnoses: same   Discharged Condition:incisional pain  Hospital Course: surgery  Consults: rehabilitation medicine  Significant Diagnostic Studies: mri  Treatments: l45 discectomy  Discharge Exam: Blood pressure 140/77, pulse 68, temperature 97.8 F (36.6 C), temperature source Oral, resp. rate 18, height 5\' 5"  (1.651 m), weight 86.4 kg (190 lb 7.6 oz), SpO2 98.00%. Minimal foot weakness. Ambulating with help  Disposition: SNF TOMORROW 04/20/12     Medication List     As of 04/19/2012 11:13 AM    ASK your doctor about these medications         acetaminophen 500 MG tablet   Commonly known as: TYLENOL   Take 1,000 mg by mouth every 6 (six) hours as needed. For pain      CALCIUM 600/VITAMIN D3 600-800 MG-UNIT Tabs   Generic drug: Calcium Carb-Cholecalciferol   Take 1 tablet by mouth daily.      diphenhydramine-acetaminophen 25-500 MG Tabs   Commonly known as: TYLENOL PM   Take 1 tablet by mouth daily as needed. Takes twice weekly as needed for sleep      enoxaparin 120 MG/0.8ML injection   Commonly known as: LOVENOX   Inject 120 mg into the skin daily.      gabapentin 300 MG capsule   Commonly known as: NEURONTIN   Take 300 mg by mouth Three times a day.      guaiFENesin-codeine 100-10 MG/5ML syrup   Commonly known as: ROBITUSSIN AC   Take 5 mLs by mouth 3 (three) times daily as needed. For cough      HYDROmorphone 4 MG tablet   Commonly known as: DILAUDID   Take 4 mg by mouth every 12 (twelve) hours as needed.      Magnesium 250 MG Tabs   Take 1 tablet by mouth daily.      ondansetron 4 MG tablet   Commonly known as: ZOFRAN   Take 4 mg by mouth every 6 (six) hours as needed. nausea      warfarin 5 MG tablet   Commonly known as: COUMADIN   Take  2.5-5 mg by mouth daily. *Take one-half tablet by mouth on Sundays and Tuesdays, then take one tablet on all other days         Signed: Floyce Stakes 04/19/2012, 11:13 AM

## 2012-04-19 NOTE — Progress Notes (Signed)
Discharge Note: Pt is alert and oriented, VS are stable, denies CP.  IV discontinued.  Discharge instructions reviewed with patient, pt verbalizes understanding.  Ambulance transportation provided, all belongings with patient

## 2012-04-19 NOTE — Progress Notes (Signed)
Patient was to be d/c'ed to SNF today, but at time of discharge, family expressed reservations regarding medication list at discharge, unavailability of medications at SNF for the night, lack of bowel movement since Sunday, and audible wheezing.  MD notified.  Consult for Hospitalist was obtained.  Neb treatment ordered, fleet enema given with successful bowel movement.

## 2012-04-19 NOTE — Progress Notes (Signed)
Patient ID: Lisa Lucas, female   DOB: 05-06-1935, 77 y.o.   MRN: [id number] Better, waiting for a bed at a SNF close to home. Wound dry

## 2012-04-20 DIAGNOSIS — Z5189 Encounter for other specified aftercare: Secondary | ICD-10-CM | POA: Diagnosis not present

## 2012-04-20 DIAGNOSIS — R262 Difficulty in walking, not elsewhere classified: Secondary | ICD-10-CM | POA: Diagnosis not present

## 2012-04-20 DIAGNOSIS — M4800 Spinal stenosis, site unspecified: Secondary | ICD-10-CM | POA: Diagnosis not present

## 2012-04-20 DIAGNOSIS — R279 Unspecified lack of coordination: Secondary | ICD-10-CM | POA: Diagnosis not present

## 2012-04-20 DIAGNOSIS — Z9889 Other specified postprocedural states: Secondary | ICD-10-CM | POA: Diagnosis not present

## 2012-04-20 DIAGNOSIS — M6281 Muscle weakness (generalized): Secondary | ICD-10-CM | POA: Diagnosis not present

## 2012-04-20 DIAGNOSIS — R52 Pain, unspecified: Secondary | ICD-10-CM | POA: Diagnosis not present

## 2012-04-20 DIAGNOSIS — E785 Hyperlipidemia, unspecified: Secondary | ICD-10-CM | POA: Diagnosis not present

## 2012-04-20 DIAGNOSIS — I10 Essential (primary) hypertension: Secondary | ICD-10-CM | POA: Diagnosis not present

## 2012-04-20 DIAGNOSIS — M5126 Other intervertebral disc displacement, lumbar region: Secondary | ICD-10-CM | POA: Diagnosis not present

## 2012-04-20 MED ORDER — HYDROMORPHONE HCL 2 MG PO TABS: 2.0000 mg | ORAL_TABLET | ORAL | Status: DC | PRN

## 2012-04-20 MED ORDER — WARFARIN - PHARMACIST DOSING INPATIENT: Status: DC

## 2012-04-20 MED ORDER — ALBUTEROL SULFATE (5 MG/ML) 0.5% IN NEBU: 2.5000 mg | INHALATION_SOLUTION | RESPIRATORY_TRACT | Status: DC | PRN

## 2012-04-20 MED ORDER — WARFARIN SODIUM 7.5 MG PO TABS: 7.5000 mg | ORAL_TABLET | Freq: Once | ORAL | Status: DC

## 2012-04-20 MED ORDER — DIAZEPAM 5 MG PO TABS: 5.0000 mg | ORAL_TABLET | Freq: Four times a day (QID) | ORAL | Status: DC | PRN

## 2012-04-20 MED ORDER — WARFARIN SODIUM 7.5 MG PO TABS
7.5000 mg | ORAL_TABLET | Freq: Once | ORAL | Status: AC
  Administered 2012-04-20: 7.5 mg via ORAL
  Filled 2012-04-20: qty 1

## 2012-04-20 MED ORDER — ENOXAPARIN SODIUM 120 MG/0.8ML ~~LOC~~ SOLN: 120.0000 mg | Freq: Every day | SUBCUTANEOUS | Status: DC

## 2012-04-20 NOTE — Progress Notes (Signed)
ANTICOAGULATION CONSULT NOTE - Initial Consult  Pharmacy Consult for Lovenox and Coumadin Indication: pulmonary embolus and DVT (history)  Allergies  Allergen Reactions  . Hydrocodone Nausea Only  . Oxycodone Nausea Only    Patient Measurements: Height: 5\' 5"  (165.1 cm) Weight: 190 lb 7.6 oz (86.4 kg) IBW/kg (Calculated) : 57   Vital Signs: Temp: 98.1 F (36.7 C) (02/01 1000) Temp src: Oral (02/01 1000) BP: 142/109 mmHg (02/01 1000) Pulse Rate: 92  (02/01 1000)  Labs:  Basename 04/20/12 0500 04/19/12 1845  HGB -- --  HCT -- --  PLT -- --  APTT -- --  LABPROT 13.8 12.7  INR 1.07 0.96  HEPARINUNFRC -- --  CREATININE -- --  CKTOTAL -- --  CKMB -- --  TROPONINI -- --    Estimated Creatinine Clearance: 35.7 ml/min (by C-G formula based on Cr of 1.5).   Medical History: Past Medical History  Diagnosis Date  . Hypertension   . DVT (deep venous thrombosis) 12/2010    right lower extremity and PE but also had prior DVT and had been on coumadin but converted to plavix/asa due to "reaction" until she presented with another DVT/PE in 12/2010.;  LLE DVT 02/2012  . Hypercholesteremia   . Osteoporosis   . Pulmonary embolism 12/2010  . Spinal stenosis   . Arthritis   . Pneumonia     recent  1 month ago  . Peripheral vascular disease   . Cancer     kidney bil   . PONV (postoperative nausea and vomiting)     has difficulty breathing coming out of anesthesia,  . History of kidney cancer   . Shortness of breath     Medications:  Prescriptions prior to admission  Medication Sig Dispense Refill  . Calcium Carb-Cholecalciferol (CALCIUM 600/VITAMIN D3) 600-800 MG-UNIT TABS Take 1 tablet by mouth daily.      . diphenhydramine-acetaminophen (TYLENOL PM) 25-500 MG TABS Take 1 tablet by mouth daily as needed. Takes twice weekly as needed for sleep      . gabapentin (NEURONTIN) 300 MG capsule Take 300 mg by mouth Three times a day.       Marland Kitchen guaiFENesin-codeine (ROBITUSSIN AC)  100-10 MG/5ML syrup Take 5 mLs by mouth 3 (three) times daily as needed. For cough      . HYDROmorphone (DILAUDID) 4 MG tablet Take 4 mg by mouth every 12 (twelve) hours as needed.      . Magnesium 250 MG TABS Take 1 tablet by mouth daily.      . ondansetron (ZOFRAN) 4 MG tablet Take 4 mg by mouth every 6 (six) hours as needed. nausea      . [DISCONTINUED] enoxaparin (LOVENOX) 120 MG/0.8ML injection Inject 120 mg into the skin daily.      Marland Kitchen acetaminophen (TYLENOL) 500 MG tablet Take 1,000 mg by mouth every 6 (six) hours as needed. For pain      . warfarin (COUMADIN) 5 MG tablet Take 2.5-5 mg by mouth daily. *Take one-half tablet by mouth on Sundays and Tuesdays, then take one tablet on all other days        Assessment: 77 yo F on Coumadin PTA for hx multiple VTEs.  Pt was bridged with Lovenox 120 mg SQ daily while off Coumadin for planned back surgery.  Patient is s/p L4-5 diskectomy and nerve decompression on 04/17/11.  INR 1.07 today after resuming coumadin last night. Will give another load today.  Home Coumadin regimen = 5mg  daily except 2.5  mg on Sunday and Tuesday.  Goal of Therapy:  INR 2-3 Monitor platelets by anticoagulation protocol: Yes    Plan:   Cont lovenox 80 mg sq bid Coumadin 7.5mg  PO x1 Daily INR

## 2012-04-20 NOTE — Progress Notes (Signed)
Clinical Social Work  CSW confirmed with [facility] they are ready to accept patient today. CSW provided RN with report number and prepared dc packet. CSW met with patient and dtrs in room. Dtrs prefer to transport patient. CSW confirmed with RN that patient could transport in car and RN agreeable. CSW asked RN to give dtrs dc packet when they leave with patient. CSW is signing off but available if needed.  [place], [place] at [place] [phone] (Weekend Coverage)

## 2012-04-20 NOTE — Progress Notes (Signed)
Physical Therapy Treatment Patient Details Name: Lisa Lucas MRN: [id number] DOB: 1935-11-13 Today's Date: 04/20/2012 Time: [id number] PT Time Calculation (min): 15 min  PT Assessment / Plan / Recommendation Comments on Treatment Session       Follow Up Recommendations  SNF;Supervision/Assistance - 24 hour     Does the patient have the potential to tolerate intense rehabilitation     Barriers to Discharge        Equipment Recommendations  Rolling walker with 5" wheels    Recommendations for Other Services    Frequency Min 5X/week   Plan Discharge plan remains appropriate    Precautions / Restrictions Precautions Precautions: Back Precaution Comments: Pt able to recall 2/3 back precautions.  Cueing for "no twisting" Restrictions Weight Bearing Restrictions: No       Mobility  Bed Mobility Bed Mobility: Rolling Right;Right Sidelying to Sit;Sitting - Scoot to Edge of Bed Rolling Right: 4: Min guard;With rail Right Sidelying to Sit: 4: Min assist;HOB flat;With rails Details for Bed Mobility Assistance: Cues for sequencing & technique.  (A) to lift shoulders/trunk to sitting upright.   Transfers Transfers: Sit to Stand;Stand to Sit Sit to Stand: 4: Min guard;With upper extremity assist;From bed Stand to Sit: 4: Min guard;With upper extremity assist;With armrests;To chair/3-in-1 Details for Transfer Assistance: Cues for hand placement & technique.   Ambulation/Gait Ambulation/Gait Assistance: 4: Min guard Ambulation Distance (Feet): 150 Feet Assistive device: Rolling walker Ambulation/Gait Assistance Details: Cues for tall posture, body positioning inside RW, & use of UE's for support especially with Lt stance phase due to weakness.    Pt reports Lt LE feeling weak & "wants to give out".   Gait Pattern: Step-through pattern;Decreased stance time - left;Decreased step length - right;Decreased stride length Stairs: No Wheelchair Mobility Wheelchair Mobility: No       PT Goals Acute Rehab PT Goals Time For Goal Achievement: 04/24/12 Potential to Achieve Goals: Good Pt will Roll Supine to Left Side: with supervision;with rail Pt will go Supine/Side to Sit: with supervision;with HOB 0 degrees;with rail PT Goal: Supine/Side to Sit - Progress: Progressing toward goal Pt will go Sit to Stand: with min assist;with upper extremity assist PT Goal: Sit to Stand - Progress: Progressing toward goal Pt will Ambulate: 51 - 150 feet;with supervision;with rolling walker PT Goal: Ambulate - Progress: Progressing toward goal Pt will Go Up / Down Stairs: 3-5 stairs;with min assist;with rail(s) Additional Goals Additional Goal #1: Pt independent with recall of 3/3 back precautions and 100% compliance. PT Goal: Additional Goal #1 - Progress: Progressing toward goal  Visit Information  Last PT Received On: 04/20/12 Assistance Needed: +1    Subjective Data      Cognition  Overall Cognitive Status: Appears within functional limits for tasks assessed/performed Arousal/Alertness: Awake/alert Orientation Level: Appears intact for tasks assessed Behavior During Session: [facility] for tasks performed    Balance     End of Session PT - End of Session Equipment Utilized During Treatment: Gait belt Activity Tolerance: Patient tolerated treatment well Patient left: in chair;with call bell/phone within reach;with family/visitor present Nurse Communication: Mobility status     Sarajane Marek, [place] [phone] 04/20/2012

## 2012-04-21 DIAGNOSIS — I10 Essential (primary) hypertension: Secondary | ICD-10-CM | POA: Diagnosis not present

## 2012-04-21 DIAGNOSIS — M5126 Other intervertebral disc displacement, lumbar region: Secondary | ICD-10-CM | POA: Diagnosis not present

## 2012-04-21 DIAGNOSIS — E785 Hyperlipidemia, unspecified: Secondary | ICD-10-CM | POA: Diagnosis not present

## 2012-05-21 DIAGNOSIS — I10 Essential (primary) hypertension: Secondary | ICD-10-CM | POA: Diagnosis not present

## 2012-05-21 DIAGNOSIS — M545 Low back pain, unspecified: Secondary | ICD-10-CM | POA: Diagnosis not present

## 2012-05-21 DIAGNOSIS — M48061 Spinal stenosis, lumbar region without neurogenic claudication: Secondary | ICD-10-CM | POA: Diagnosis not present

## 2012-05-21 DIAGNOSIS — I2510 Atherosclerotic heart disease of native coronary artery without angina pectoris: Secondary | ICD-10-CM | POA: Diagnosis not present

## 2012-05-21 DIAGNOSIS — R269 Unspecified abnormalities of gait and mobility: Secondary | ICD-10-CM | POA: Diagnosis not present

## 2012-05-21 DIAGNOSIS — I82409 Acute embolism and thrombosis of unspecified deep veins of unspecified lower extremity: Secondary | ICD-10-CM | POA: Diagnosis not present

## 2012-05-22 DIAGNOSIS — A419 Sepsis, unspecified organism: Secondary | ICD-10-CM | POA: Diagnosis not present

## 2012-05-22 DIAGNOSIS — Z86718 Personal history of other venous thrombosis and embolism: Secondary | ICD-10-CM | POA: Diagnosis not present

## 2012-05-22 DIAGNOSIS — I2510 Atherosclerotic heart disease of native coronary artery without angina pectoris: Secondary | ICD-10-CM | POA: Diagnosis not present

## 2012-05-22 DIAGNOSIS — R509 Fever, unspecified: Secondary | ICD-10-CM | POA: Diagnosis not present

## 2012-05-22 DIAGNOSIS — R319 Hematuria, unspecified: Secondary | ICD-10-CM | POA: Diagnosis not present

## 2012-05-22 DIAGNOSIS — N189 Chronic kidney disease, unspecified: Secondary | ICD-10-CM | POA: Diagnosis not present

## 2012-05-22 DIAGNOSIS — I129 Hypertensive chronic kidney disease with stage 1 through stage 4 chronic kidney disease, or unspecified chronic kidney disease: Secondary | ICD-10-CM | POA: Diagnosis present

## 2012-05-22 DIAGNOSIS — R0902 Hypoxemia: Secondary | ICD-10-CM | POA: Diagnosis not present

## 2012-05-22 DIAGNOSIS — N179 Acute kidney failure, unspecified: Secondary | ICD-10-CM | POA: Diagnosis not present

## 2012-05-22 DIAGNOSIS — I959 Hypotension, unspecified: Secondary | ICD-10-CM | POA: Diagnosis not present

## 2012-05-22 DIAGNOSIS — R269 Unspecified abnormalities of gait and mobility: Secondary | ICD-10-CM | POA: Diagnosis not present

## 2012-05-22 DIAGNOSIS — Z87891 Personal history of nicotine dependence: Secondary | ICD-10-CM | POA: Diagnosis not present

## 2012-05-22 DIAGNOSIS — Z87440 Personal history of urinary (tract) infections: Secondary | ICD-10-CM | POA: Diagnosis not present

## 2012-05-22 DIAGNOSIS — I10 Essential (primary) hypertension: Secondary | ICD-10-CM | POA: Diagnosis not present

## 2012-05-22 DIAGNOSIS — E860 Dehydration: Secondary | ICD-10-CM | POA: Diagnosis present

## 2012-05-22 DIAGNOSIS — N323 Diverticulum of bladder: Secondary | ICD-10-CM | POA: Diagnosis not present

## 2012-05-22 DIAGNOSIS — N183 Chronic kidney disease, stage 3 unspecified: Secondary | ICD-10-CM | POA: Diagnosis not present

## 2012-05-22 DIAGNOSIS — R791 Abnormal coagulation profile: Secondary | ICD-10-CM | POA: Diagnosis present

## 2012-05-22 DIAGNOSIS — M48061 Spinal stenosis, lumbar region without neurogenic claudication: Secondary | ICD-10-CM | POA: Diagnosis not present

## 2012-05-22 DIAGNOSIS — N170 Acute kidney failure with tubular necrosis: Secondary | ICD-10-CM | POA: Diagnosis not present

## 2012-05-22 DIAGNOSIS — Z9981 Dependence on supplemental oxygen: Secondary | ICD-10-CM | POA: Diagnosis not present

## 2012-05-22 DIAGNOSIS — N390 Urinary tract infection, site not specified: Secondary | ICD-10-CM | POA: Diagnosis not present

## 2012-05-22 DIAGNOSIS — R310 Gross hematuria: Secondary | ICD-10-CM | POA: Diagnosis present

## 2012-05-22 DIAGNOSIS — I82409 Acute embolism and thrombosis of unspecified deep veins of unspecified lower extremity: Secondary | ICD-10-CM | POA: Diagnosis not present

## 2012-05-22 DIAGNOSIS — K5900 Constipation, unspecified: Secondary | ICD-10-CM | POA: Diagnosis present

## 2012-05-22 DIAGNOSIS — A4152 Sepsis due to Pseudomonas: Secondary | ICD-10-CM | POA: Diagnosis not present

## 2012-05-22 DIAGNOSIS — Z85528 Personal history of other malignant neoplasm of kidney: Secondary | ICD-10-CM | POA: Diagnosis not present

## 2012-05-22 DIAGNOSIS — Z7901 Long term (current) use of anticoagulants: Secondary | ICD-10-CM | POA: Diagnosis not present

## 2012-05-22 DIAGNOSIS — Z86711 Personal history of pulmonary embolism: Secondary | ICD-10-CM | POA: Diagnosis not present

## 2012-05-22 DIAGNOSIS — R4182 Altered mental status, unspecified: Secondary | ICD-10-CM | POA: Diagnosis not present

## 2012-05-22 DIAGNOSIS — C649 Malignant neoplasm of unspecified kidney, except renal pelvis: Secondary | ICD-10-CM | POA: Diagnosis not present

## 2012-05-22 DIAGNOSIS — M545 Low back pain, unspecified: Secondary | ICD-10-CM | POA: Diagnosis not present

## 2012-05-27 DIAGNOSIS — IMO0002 Reserved for concepts with insufficient information to code with codable children: Secondary | ICD-10-CM | POA: Diagnosis not present

## 2012-05-27 DIAGNOSIS — Z7901 Long term (current) use of anticoagulants: Secondary | ICD-10-CM | POA: Diagnosis not present

## 2012-05-27 DIAGNOSIS — I2699 Other pulmonary embolism without acute cor pulmonale: Secondary | ICD-10-CM | POA: Diagnosis not present

## 2012-05-29 DIAGNOSIS — I2510 Atherosclerotic heart disease of native coronary artery without angina pectoris: Secondary | ICD-10-CM | POA: Diagnosis not present

## 2012-05-29 DIAGNOSIS — M545 Low back pain, unspecified: Secondary | ICD-10-CM | POA: Diagnosis not present

## 2012-05-29 DIAGNOSIS — M48061 Spinal stenosis, lumbar region without neurogenic claudication: Secondary | ICD-10-CM | POA: Diagnosis not present

## 2012-05-29 DIAGNOSIS — R269 Unspecified abnormalities of gait and mobility: Secondary | ICD-10-CM | POA: Diagnosis not present

## 2012-05-29 DIAGNOSIS — I82409 Acute embolism and thrombosis of unspecified deep veins of unspecified lower extremity: Secondary | ICD-10-CM | POA: Diagnosis not present

## 2012-05-29 DIAGNOSIS — I10 Essential (primary) hypertension: Secondary | ICD-10-CM | POA: Diagnosis not present

## 2012-05-31 DIAGNOSIS — I82409 Acute embolism and thrombosis of unspecified deep veins of unspecified lower extremity: Secondary | ICD-10-CM | POA: Diagnosis not present

## 2012-05-31 DIAGNOSIS — I2510 Atherosclerotic heart disease of native coronary artery without angina pectoris: Secondary | ICD-10-CM | POA: Diagnosis not present

## 2012-05-31 DIAGNOSIS — R269 Unspecified abnormalities of gait and mobility: Secondary | ICD-10-CM | POA: Diagnosis not present

## 2012-05-31 DIAGNOSIS — M545 Low back pain, unspecified: Secondary | ICD-10-CM | POA: Diagnosis not present

## 2012-05-31 DIAGNOSIS — I10 Essential (primary) hypertension: Secondary | ICD-10-CM | POA: Diagnosis not present

## 2012-05-31 DIAGNOSIS — M48061 Spinal stenosis, lumbar region without neurogenic claudication: Secondary | ICD-10-CM | POA: Diagnosis not present

## 2012-06-01 DIAGNOSIS — Z7901 Long term (current) use of anticoagulants: Secondary | ICD-10-CM | POA: Diagnosis not present

## 2012-06-01 DIAGNOSIS — I2510 Atherosclerotic heart disease of native coronary artery without angina pectoris: Secondary | ICD-10-CM | POA: Diagnosis not present

## 2012-06-01 DIAGNOSIS — Z9181 History of falling: Secondary | ICD-10-CM | POA: Diagnosis not present

## 2012-06-01 DIAGNOSIS — M545 Low back pain, unspecified: Secondary | ICD-10-CM | POA: Diagnosis not present

## 2012-06-01 DIAGNOSIS — Z9981 Dependence on supplemental oxygen: Secondary | ICD-10-CM | POA: Diagnosis not present

## 2012-06-01 DIAGNOSIS — R279 Unspecified lack of coordination: Secondary | ICD-10-CM | POA: Diagnosis not present

## 2012-06-01 DIAGNOSIS — I82409 Acute embolism and thrombosis of unspecified deep veins of unspecified lower extremity: Secondary | ICD-10-CM | POA: Diagnosis not present

## 2012-06-01 DIAGNOSIS — I10 Essential (primary) hypertension: Secondary | ICD-10-CM | POA: Diagnosis not present

## 2012-06-01 DIAGNOSIS — M48061 Spinal stenosis, lumbar region without neurogenic claudication: Secondary | ICD-10-CM | POA: Diagnosis not present

## 2012-06-01 DIAGNOSIS — N390 Urinary tract infection, site not specified: Secondary | ICD-10-CM | POA: Diagnosis not present

## 2012-06-01 DIAGNOSIS — M1990 Unspecified osteoarthritis, unspecified site: Secondary | ICD-10-CM | POA: Diagnosis not present

## 2012-06-01 DIAGNOSIS — Z8701 Personal history of pneumonia (recurrent): Secondary | ICD-10-CM | POA: Diagnosis not present

## 2012-06-01 DIAGNOSIS — Z5181 Encounter for therapeutic drug level monitoring: Secondary | ICD-10-CM | POA: Diagnosis not present

## 2012-06-03 DIAGNOSIS — I82409 Acute embolism and thrombosis of unspecified deep veins of unspecified lower extremity: Secondary | ICD-10-CM | POA: Diagnosis not present

## 2012-06-03 DIAGNOSIS — M545 Low back pain, unspecified: Secondary | ICD-10-CM | POA: Diagnosis not present

## 2012-06-03 DIAGNOSIS — N390 Urinary tract infection, site not specified: Secondary | ICD-10-CM | POA: Diagnosis not present

## 2012-06-03 DIAGNOSIS — M48061 Spinal stenosis, lumbar region without neurogenic claudication: Secondary | ICD-10-CM | POA: Diagnosis not present

## 2012-06-03 DIAGNOSIS — R279 Unspecified lack of coordination: Secondary | ICD-10-CM | POA: Diagnosis not present

## 2012-06-03 DIAGNOSIS — I2510 Atherosclerotic heart disease of native coronary artery without angina pectoris: Secondary | ICD-10-CM | POA: Diagnosis not present

## 2012-06-05 DIAGNOSIS — N390 Urinary tract infection, site not specified: Secondary | ICD-10-CM | POA: Diagnosis not present

## 2012-06-05 DIAGNOSIS — I2510 Atherosclerotic heart disease of native coronary artery without angina pectoris: Secondary | ICD-10-CM | POA: Diagnosis not present

## 2012-06-05 DIAGNOSIS — R279 Unspecified lack of coordination: Secondary | ICD-10-CM | POA: Diagnosis not present

## 2012-06-05 DIAGNOSIS — M545 Low back pain, unspecified: Secondary | ICD-10-CM | POA: Diagnosis not present

## 2012-06-05 DIAGNOSIS — M48061 Spinal stenosis, lumbar region without neurogenic claudication: Secondary | ICD-10-CM | POA: Diagnosis not present

## 2012-06-05 DIAGNOSIS — I82409 Acute embolism and thrombosis of unspecified deep veins of unspecified lower extremity: Secondary | ICD-10-CM | POA: Diagnosis not present

## 2012-06-06 DIAGNOSIS — N390 Urinary tract infection, site not specified: Secondary | ICD-10-CM | POA: Diagnosis not present

## 2012-06-06 DIAGNOSIS — I82409 Acute embolism and thrombosis of unspecified deep veins of unspecified lower extremity: Secondary | ICD-10-CM | POA: Diagnosis not present

## 2012-06-07 DIAGNOSIS — M48061 Spinal stenosis, lumbar region without neurogenic claudication: Secondary | ICD-10-CM | POA: Diagnosis not present

## 2012-06-07 DIAGNOSIS — M545 Low back pain, unspecified: Secondary | ICD-10-CM | POA: Diagnosis not present

## 2012-06-07 DIAGNOSIS — I82409 Acute embolism and thrombosis of unspecified deep veins of unspecified lower extremity: Secondary | ICD-10-CM | POA: Diagnosis not present

## 2012-06-07 DIAGNOSIS — I2510 Atherosclerotic heart disease of native coronary artery without angina pectoris: Secondary | ICD-10-CM | POA: Diagnosis not present

## 2012-06-07 DIAGNOSIS — N390 Urinary tract infection, site not specified: Secondary | ICD-10-CM | POA: Diagnosis not present

## 2012-06-07 DIAGNOSIS — R279 Unspecified lack of coordination: Secondary | ICD-10-CM | POA: Diagnosis not present

## 2012-06-10 DIAGNOSIS — I2510 Atherosclerotic heart disease of native coronary artery without angina pectoris: Secondary | ICD-10-CM | POA: Diagnosis not present

## 2012-06-10 DIAGNOSIS — M545 Low back pain, unspecified: Secondary | ICD-10-CM | POA: Diagnosis not present

## 2012-06-10 DIAGNOSIS — R279 Unspecified lack of coordination: Secondary | ICD-10-CM | POA: Diagnosis not present

## 2012-06-10 DIAGNOSIS — N390 Urinary tract infection, site not specified: Secondary | ICD-10-CM | POA: Diagnosis not present

## 2012-06-10 DIAGNOSIS — I82409 Acute embolism and thrombosis of unspecified deep veins of unspecified lower extremity: Secondary | ICD-10-CM | POA: Diagnosis not present

## 2012-06-10 DIAGNOSIS — M48061 Spinal stenosis, lumbar region without neurogenic claudication: Secondary | ICD-10-CM | POA: Diagnosis not present

## 2012-06-11 DIAGNOSIS — I82409 Acute embolism and thrombosis of unspecified deep veins of unspecified lower extremity: Secondary | ICD-10-CM | POA: Diagnosis not present

## 2012-06-11 DIAGNOSIS — N390 Urinary tract infection, site not specified: Secondary | ICD-10-CM | POA: Diagnosis not present

## 2012-06-11 DIAGNOSIS — I2510 Atherosclerotic heart disease of native coronary artery without angina pectoris: Secondary | ICD-10-CM | POA: Diagnosis not present

## 2012-06-11 DIAGNOSIS — M545 Low back pain, unspecified: Secondary | ICD-10-CM | POA: Diagnosis not present

## 2012-06-11 DIAGNOSIS — M48061 Spinal stenosis, lumbar region without neurogenic claudication: Secondary | ICD-10-CM | POA: Diagnosis not present

## 2012-06-11 DIAGNOSIS — R279 Unspecified lack of coordination: Secondary | ICD-10-CM | POA: Diagnosis not present

## 2012-06-12 DIAGNOSIS — J3489 Other specified disorders of nose and nasal sinuses: Secondary | ICD-10-CM | POA: Diagnosis not present

## 2012-06-12 DIAGNOSIS — M545 Low back pain, unspecified: Secondary | ICD-10-CM | POA: Diagnosis not present

## 2012-06-12 DIAGNOSIS — I82409 Acute embolism and thrombosis of unspecified deep veins of unspecified lower extremity: Secondary | ICD-10-CM | POA: Diagnosis not present

## 2012-06-12 DIAGNOSIS — Z09 Encounter for follow-up examination after completed treatment for conditions other than malignant neoplasm: Secondary | ICD-10-CM | POA: Diagnosis not present

## 2012-06-12 DIAGNOSIS — R279 Unspecified lack of coordination: Secondary | ICD-10-CM | POA: Diagnosis not present

## 2012-06-12 DIAGNOSIS — Z7901 Long term (current) use of anticoagulants: Secondary | ICD-10-CM | POA: Diagnosis not present

## 2012-06-12 DIAGNOSIS — N189 Chronic kidney disease, unspecified: Secondary | ICD-10-CM | POA: Diagnosis not present

## 2012-06-12 DIAGNOSIS — N390 Urinary tract infection, site not specified: Secondary | ICD-10-CM | POA: Diagnosis not present

## 2012-06-12 DIAGNOSIS — I2510 Atherosclerotic heart disease of native coronary artery without angina pectoris: Secondary | ICD-10-CM | POA: Diagnosis not present

## 2012-06-12 DIAGNOSIS — A419 Sepsis, unspecified organism: Secondary | ICD-10-CM | POA: Diagnosis not present

## 2012-06-12 DIAGNOSIS — N179 Acute kidney failure, unspecified: Secondary | ICD-10-CM | POA: Diagnosis not present

## 2012-06-12 DIAGNOSIS — R6520 Severe sepsis without septic shock: Secondary | ICD-10-CM | POA: Diagnosis not present

## 2012-06-12 DIAGNOSIS — M48061 Spinal stenosis, lumbar region without neurogenic claudication: Secondary | ICD-10-CM | POA: Diagnosis not present

## 2012-06-13 DIAGNOSIS — I2510 Atherosclerotic heart disease of native coronary artery without angina pectoris: Secondary | ICD-10-CM | POA: Diagnosis not present

## 2012-06-13 DIAGNOSIS — I82409 Acute embolism and thrombosis of unspecified deep veins of unspecified lower extremity: Secondary | ICD-10-CM | POA: Diagnosis not present

## 2012-06-13 DIAGNOSIS — M545 Low back pain, unspecified: Secondary | ICD-10-CM | POA: Diagnosis not present

## 2012-06-13 DIAGNOSIS — R279 Unspecified lack of coordination: Secondary | ICD-10-CM | POA: Diagnosis not present

## 2012-06-13 DIAGNOSIS — N390 Urinary tract infection, site not specified: Secondary | ICD-10-CM | POA: Diagnosis not present

## 2012-06-13 DIAGNOSIS — M48061 Spinal stenosis, lumbar region without neurogenic claudication: Secondary | ICD-10-CM | POA: Diagnosis not present

## 2012-06-14 DIAGNOSIS — I82409 Acute embolism and thrombosis of unspecified deep veins of unspecified lower extremity: Secondary | ICD-10-CM | POA: Diagnosis not present

## 2012-06-14 DIAGNOSIS — M545 Low back pain, unspecified: Secondary | ICD-10-CM | POA: Diagnosis not present

## 2012-06-14 DIAGNOSIS — R279 Unspecified lack of coordination: Secondary | ICD-10-CM | POA: Diagnosis not present

## 2012-06-14 DIAGNOSIS — N390 Urinary tract infection, site not specified: Secondary | ICD-10-CM | POA: Diagnosis not present

## 2012-06-14 DIAGNOSIS — I2510 Atherosclerotic heart disease of native coronary artery without angina pectoris: Secondary | ICD-10-CM | POA: Diagnosis not present

## 2012-06-14 DIAGNOSIS — M48061 Spinal stenosis, lumbar region without neurogenic claudication: Secondary | ICD-10-CM | POA: Diagnosis not present

## 2012-06-17 DIAGNOSIS — R279 Unspecified lack of coordination: Secondary | ICD-10-CM | POA: Diagnosis not present

## 2012-06-17 DIAGNOSIS — I2510 Atherosclerotic heart disease of native coronary artery without angina pectoris: Secondary | ICD-10-CM | POA: Diagnosis not present

## 2012-06-17 DIAGNOSIS — M545 Low back pain, unspecified: Secondary | ICD-10-CM | POA: Diagnosis not present

## 2012-06-17 DIAGNOSIS — I82409 Acute embolism and thrombosis of unspecified deep veins of unspecified lower extremity: Secondary | ICD-10-CM | POA: Diagnosis not present

## 2012-06-17 DIAGNOSIS — N390 Urinary tract infection, site not specified: Secondary | ICD-10-CM | POA: Diagnosis not present

## 2012-06-17 DIAGNOSIS — M48061 Spinal stenosis, lumbar region without neurogenic claudication: Secondary | ICD-10-CM | POA: Diagnosis not present

## 2012-06-18 DIAGNOSIS — M545 Low back pain, unspecified: Secondary | ICD-10-CM | POA: Diagnosis not present

## 2012-06-18 DIAGNOSIS — N390 Urinary tract infection, site not specified: Secondary | ICD-10-CM | POA: Diagnosis not present

## 2012-06-18 DIAGNOSIS — R279 Unspecified lack of coordination: Secondary | ICD-10-CM | POA: Diagnosis not present

## 2012-06-18 DIAGNOSIS — I2510 Atherosclerotic heart disease of native coronary artery without angina pectoris: Secondary | ICD-10-CM | POA: Diagnosis not present

## 2012-06-18 DIAGNOSIS — M48061 Spinal stenosis, lumbar region without neurogenic claudication: Secondary | ICD-10-CM | POA: Diagnosis not present

## 2012-06-18 DIAGNOSIS — I82409 Acute embolism and thrombosis of unspecified deep veins of unspecified lower extremity: Secondary | ICD-10-CM | POA: Diagnosis not present

## 2012-06-19 DIAGNOSIS — R279 Unspecified lack of coordination: Secondary | ICD-10-CM | POA: Diagnosis not present

## 2012-06-19 DIAGNOSIS — I82409 Acute embolism and thrombosis of unspecified deep veins of unspecified lower extremity: Secondary | ICD-10-CM | POA: Diagnosis not present

## 2012-06-19 DIAGNOSIS — M48061 Spinal stenosis, lumbar region without neurogenic claudication: Secondary | ICD-10-CM | POA: Diagnosis not present

## 2012-06-19 DIAGNOSIS — N390 Urinary tract infection, site not specified: Secondary | ICD-10-CM | POA: Diagnosis not present

## 2012-06-19 DIAGNOSIS — M545 Low back pain, unspecified: Secondary | ICD-10-CM | POA: Diagnosis not present

## 2012-06-19 DIAGNOSIS — I2510 Atherosclerotic heart disease of native coronary artery without angina pectoris: Secondary | ICD-10-CM | POA: Diagnosis not present

## 2012-06-20 DIAGNOSIS — M48061 Spinal stenosis, lumbar region without neurogenic claudication: Secondary | ICD-10-CM | POA: Diagnosis not present

## 2012-06-20 DIAGNOSIS — R279 Unspecified lack of coordination: Secondary | ICD-10-CM | POA: Diagnosis not present

## 2012-06-20 DIAGNOSIS — I82409 Acute embolism and thrombosis of unspecified deep veins of unspecified lower extremity: Secondary | ICD-10-CM | POA: Diagnosis not present

## 2012-06-20 DIAGNOSIS — M545 Low back pain, unspecified: Secondary | ICD-10-CM | POA: Diagnosis not present

## 2012-06-20 DIAGNOSIS — N390 Urinary tract infection, site not specified: Secondary | ICD-10-CM | POA: Diagnosis not present

## 2012-06-20 DIAGNOSIS — I2510 Atherosclerotic heart disease of native coronary artery without angina pectoris: Secondary | ICD-10-CM | POA: Diagnosis not present

## 2012-06-24 DIAGNOSIS — R279 Unspecified lack of coordination: Secondary | ICD-10-CM | POA: Diagnosis not present

## 2012-06-24 DIAGNOSIS — M545 Low back pain, unspecified: Secondary | ICD-10-CM | POA: Diagnosis not present

## 2012-06-24 DIAGNOSIS — I2510 Atherosclerotic heart disease of native coronary artery without angina pectoris: Secondary | ICD-10-CM | POA: Diagnosis not present

## 2012-06-24 DIAGNOSIS — I82409 Acute embolism and thrombosis of unspecified deep veins of unspecified lower extremity: Secondary | ICD-10-CM | POA: Diagnosis not present

## 2012-06-24 DIAGNOSIS — M48061 Spinal stenosis, lumbar region without neurogenic claudication: Secondary | ICD-10-CM | POA: Diagnosis not present

## 2012-06-24 DIAGNOSIS — N390 Urinary tract infection, site not specified: Secondary | ICD-10-CM | POA: Diagnosis not present

## 2012-06-25 DIAGNOSIS — I82409 Acute embolism and thrombosis of unspecified deep veins of unspecified lower extremity: Secondary | ICD-10-CM | POA: Diagnosis not present

## 2012-06-25 DIAGNOSIS — M48061 Spinal stenosis, lumbar region without neurogenic claudication: Secondary | ICD-10-CM | POA: Diagnosis not present

## 2012-06-25 DIAGNOSIS — R279 Unspecified lack of coordination: Secondary | ICD-10-CM | POA: Diagnosis not present

## 2012-06-25 DIAGNOSIS — N390 Urinary tract infection, site not specified: Secondary | ICD-10-CM | POA: Diagnosis not present

## 2012-06-25 DIAGNOSIS — I2510 Atherosclerotic heart disease of native coronary artery without angina pectoris: Secondary | ICD-10-CM | POA: Diagnosis not present

## 2012-06-25 DIAGNOSIS — M545 Low back pain, unspecified: Secondary | ICD-10-CM | POA: Diagnosis not present

## 2012-06-26 DIAGNOSIS — M545 Low back pain, unspecified: Secondary | ICD-10-CM | POA: Diagnosis not present

## 2012-06-26 DIAGNOSIS — M48061 Spinal stenosis, lumbar region without neurogenic claudication: Secondary | ICD-10-CM | POA: Diagnosis not present

## 2012-06-26 DIAGNOSIS — N390 Urinary tract infection, site not specified: Secondary | ICD-10-CM | POA: Diagnosis not present

## 2012-06-26 DIAGNOSIS — R279 Unspecified lack of coordination: Secondary | ICD-10-CM | POA: Diagnosis not present

## 2012-06-26 DIAGNOSIS — I82409 Acute embolism and thrombosis of unspecified deep veins of unspecified lower extremity: Secondary | ICD-10-CM | POA: Diagnosis not present

## 2012-06-26 DIAGNOSIS — I2510 Atherosclerotic heart disease of native coronary artery without angina pectoris: Secondary | ICD-10-CM | POA: Diagnosis not present

## 2012-06-27 DIAGNOSIS — R279 Unspecified lack of coordination: Secondary | ICD-10-CM | POA: Diagnosis not present

## 2012-06-27 DIAGNOSIS — M48061 Spinal stenosis, lumbar region without neurogenic claudication: Secondary | ICD-10-CM | POA: Diagnosis not present

## 2012-06-27 DIAGNOSIS — N390 Urinary tract infection, site not specified: Secondary | ICD-10-CM | POA: Diagnosis not present

## 2012-06-27 DIAGNOSIS — M545 Low back pain, unspecified: Secondary | ICD-10-CM | POA: Diagnosis not present

## 2012-06-27 DIAGNOSIS — I82409 Acute embolism and thrombosis of unspecified deep veins of unspecified lower extremity: Secondary | ICD-10-CM | POA: Diagnosis not present

## 2012-06-27 DIAGNOSIS — I2510 Atherosclerotic heart disease of native coronary artery without angina pectoris: Secondary | ICD-10-CM | POA: Diagnosis not present

## 2012-07-01 DIAGNOSIS — I2510 Atherosclerotic heart disease of native coronary artery without angina pectoris: Secondary | ICD-10-CM | POA: Diagnosis not present

## 2012-07-01 DIAGNOSIS — M545 Low back pain, unspecified: Secondary | ICD-10-CM | POA: Diagnosis not present

## 2012-07-01 DIAGNOSIS — R279 Unspecified lack of coordination: Secondary | ICD-10-CM | POA: Diagnosis not present

## 2012-07-01 DIAGNOSIS — I82409 Acute embolism and thrombosis of unspecified deep veins of unspecified lower extremity: Secondary | ICD-10-CM | POA: Diagnosis not present

## 2012-07-01 DIAGNOSIS — M48061 Spinal stenosis, lumbar region without neurogenic claudication: Secondary | ICD-10-CM | POA: Diagnosis not present

## 2012-07-01 DIAGNOSIS — N390 Urinary tract infection, site not specified: Secondary | ICD-10-CM | POA: Diagnosis not present

## 2012-07-02 DIAGNOSIS — IMO0002 Reserved for concepts with insufficient information to code with codable children: Secondary | ICD-10-CM | POA: Diagnosis not present

## 2012-07-02 DIAGNOSIS — G8929 Other chronic pain: Secondary | ICD-10-CM | POA: Diagnosis not present

## 2012-07-02 DIAGNOSIS — Z7901 Long term (current) use of anticoagulants: Secondary | ICD-10-CM | POA: Diagnosis not present

## 2012-07-02 DIAGNOSIS — M549 Dorsalgia, unspecified: Secondary | ICD-10-CM | POA: Diagnosis not present

## 2012-07-03 DIAGNOSIS — I2510 Atherosclerotic heart disease of native coronary artery without angina pectoris: Secondary | ICD-10-CM | POA: Diagnosis not present

## 2012-07-03 DIAGNOSIS — I82409 Acute embolism and thrombosis of unspecified deep veins of unspecified lower extremity: Secondary | ICD-10-CM | POA: Diagnosis not present

## 2012-07-03 DIAGNOSIS — R279 Unspecified lack of coordination: Secondary | ICD-10-CM | POA: Diagnosis not present

## 2012-07-03 DIAGNOSIS — N390 Urinary tract infection, site not specified: Secondary | ICD-10-CM | POA: Diagnosis not present

## 2012-07-03 DIAGNOSIS — M48061 Spinal stenosis, lumbar region without neurogenic claudication: Secondary | ICD-10-CM | POA: Diagnosis not present

## 2012-07-03 DIAGNOSIS — M545 Low back pain, unspecified: Secondary | ICD-10-CM | POA: Diagnosis not present

## 2012-07-04 DIAGNOSIS — M48061 Spinal stenosis, lumbar region without neurogenic claudication: Secondary | ICD-10-CM | POA: Diagnosis not present

## 2012-07-04 DIAGNOSIS — R279 Unspecified lack of coordination: Secondary | ICD-10-CM | POA: Diagnosis not present

## 2012-07-04 DIAGNOSIS — N390 Urinary tract infection, site not specified: Secondary | ICD-10-CM | POA: Diagnosis not present

## 2012-07-04 DIAGNOSIS — I82409 Acute embolism and thrombosis of unspecified deep veins of unspecified lower extremity: Secondary | ICD-10-CM | POA: Diagnosis not present

## 2012-07-04 DIAGNOSIS — I2510 Atherosclerotic heart disease of native coronary artery without angina pectoris: Secondary | ICD-10-CM | POA: Diagnosis not present

## 2012-07-04 DIAGNOSIS — M545 Low back pain, unspecified: Secondary | ICD-10-CM | POA: Diagnosis not present

## 2012-07-08 DIAGNOSIS — M545 Low back pain, unspecified: Secondary | ICD-10-CM | POA: Diagnosis not present

## 2012-07-08 DIAGNOSIS — I2510 Atherosclerotic heart disease of native coronary artery without angina pectoris: Secondary | ICD-10-CM | POA: Diagnosis not present

## 2012-07-08 DIAGNOSIS — R279 Unspecified lack of coordination: Secondary | ICD-10-CM | POA: Diagnosis not present

## 2012-07-08 DIAGNOSIS — M48061 Spinal stenosis, lumbar region without neurogenic claudication: Secondary | ICD-10-CM | POA: Diagnosis not present

## 2012-07-08 DIAGNOSIS — N390 Urinary tract infection, site not specified: Secondary | ICD-10-CM | POA: Diagnosis not present

## 2012-07-08 DIAGNOSIS — I82409 Acute embolism and thrombosis of unspecified deep veins of unspecified lower extremity: Secondary | ICD-10-CM | POA: Diagnosis not present

## 2012-07-10 DIAGNOSIS — M48061 Spinal stenosis, lumbar region without neurogenic claudication: Secondary | ICD-10-CM | POA: Diagnosis not present

## 2012-07-10 DIAGNOSIS — I82409 Acute embolism and thrombosis of unspecified deep veins of unspecified lower extremity: Secondary | ICD-10-CM | POA: Diagnosis not present

## 2012-07-10 DIAGNOSIS — R279 Unspecified lack of coordination: Secondary | ICD-10-CM | POA: Diagnosis not present

## 2012-07-10 DIAGNOSIS — M545 Low back pain, unspecified: Secondary | ICD-10-CM | POA: Diagnosis not present

## 2012-07-10 DIAGNOSIS — N390 Urinary tract infection, site not specified: Secondary | ICD-10-CM | POA: Diagnosis not present

## 2012-07-10 DIAGNOSIS — I2510 Atherosclerotic heart disease of native coronary artery without angina pectoris: Secondary | ICD-10-CM | POA: Diagnosis not present

## 2012-07-11 DIAGNOSIS — R279 Unspecified lack of coordination: Secondary | ICD-10-CM | POA: Diagnosis not present

## 2012-07-11 DIAGNOSIS — M545 Low back pain, unspecified: Secondary | ICD-10-CM | POA: Diagnosis not present

## 2012-07-11 DIAGNOSIS — I2510 Atherosclerotic heart disease of native coronary artery without angina pectoris: Secondary | ICD-10-CM | POA: Diagnosis not present

## 2012-07-11 DIAGNOSIS — M48061 Spinal stenosis, lumbar region without neurogenic claudication: Secondary | ICD-10-CM | POA: Diagnosis not present

## 2012-07-11 DIAGNOSIS — I82409 Acute embolism and thrombosis of unspecified deep veins of unspecified lower extremity: Secondary | ICD-10-CM | POA: Diagnosis not present

## 2012-07-11 DIAGNOSIS — N390 Urinary tract infection, site not specified: Secondary | ICD-10-CM | POA: Diagnosis not present

## 2012-07-17 DIAGNOSIS — I2510 Atherosclerotic heart disease of native coronary artery without angina pectoris: Secondary | ICD-10-CM | POA: Diagnosis not present

## 2012-07-17 DIAGNOSIS — I82409 Acute embolism and thrombosis of unspecified deep veins of unspecified lower extremity: Secondary | ICD-10-CM | POA: Diagnosis not present

## 2012-07-17 DIAGNOSIS — M545 Low back pain, unspecified: Secondary | ICD-10-CM | POA: Diagnosis not present

## 2012-07-17 DIAGNOSIS — M48061 Spinal stenosis, lumbar region without neurogenic claudication: Secondary | ICD-10-CM | POA: Diagnosis not present

## 2012-07-17 DIAGNOSIS — N390 Urinary tract infection, site not specified: Secondary | ICD-10-CM | POA: Diagnosis not present

## 2012-07-17 DIAGNOSIS — R279 Unspecified lack of coordination: Secondary | ICD-10-CM | POA: Diagnosis not present

## 2012-07-18 DIAGNOSIS — I2510 Atherosclerotic heart disease of native coronary artery without angina pectoris: Secondary | ICD-10-CM | POA: Diagnosis not present

## 2012-07-18 DIAGNOSIS — I82409 Acute embolism and thrombosis of unspecified deep veins of unspecified lower extremity: Secondary | ICD-10-CM | POA: Diagnosis not present

## 2012-07-18 DIAGNOSIS — M545 Low back pain, unspecified: Secondary | ICD-10-CM | POA: Diagnosis not present

## 2012-07-18 DIAGNOSIS — M48061 Spinal stenosis, lumbar region without neurogenic claudication: Secondary | ICD-10-CM | POA: Diagnosis not present

## 2012-07-18 DIAGNOSIS — R279 Unspecified lack of coordination: Secondary | ICD-10-CM | POA: Diagnosis not present

## 2012-07-18 DIAGNOSIS — N390 Urinary tract infection, site not specified: Secondary | ICD-10-CM | POA: Diagnosis not present

## 2012-07-24 DIAGNOSIS — I2510 Atherosclerotic heart disease of native coronary artery without angina pectoris: Secondary | ICD-10-CM | POA: Diagnosis not present

## 2012-07-24 DIAGNOSIS — M545 Low back pain, unspecified: Secondary | ICD-10-CM | POA: Diagnosis not present

## 2012-07-24 DIAGNOSIS — N390 Urinary tract infection, site not specified: Secondary | ICD-10-CM | POA: Diagnosis not present

## 2012-07-24 DIAGNOSIS — I82409 Acute embolism and thrombosis of unspecified deep veins of unspecified lower extremity: Secondary | ICD-10-CM | POA: Diagnosis not present

## 2012-07-24 DIAGNOSIS — R279 Unspecified lack of coordination: Secondary | ICD-10-CM | POA: Diagnosis not present

## 2012-07-24 DIAGNOSIS — M48061 Spinal stenosis, lumbar region without neurogenic claudication: Secondary | ICD-10-CM | POA: Diagnosis not present

## 2012-08-09 DIAGNOSIS — Z86711 Personal history of pulmonary embolism: Secondary | ICD-10-CM | POA: Diagnosis not present

## 2012-08-09 DIAGNOSIS — Z7901 Long term (current) use of anticoagulants: Secondary | ICD-10-CM | POA: Diagnosis not present

## 2012-08-09 DIAGNOSIS — I82409 Acute embolism and thrombosis of unspecified deep veins of unspecified lower extremity: Secondary | ICD-10-CM | POA: Diagnosis not present

## 2012-09-11 DIAGNOSIS — I82409 Acute embolism and thrombosis of unspecified deep veins of unspecified lower extremity: Secondary | ICD-10-CM | POA: Diagnosis not present

## 2012-10-08 DIAGNOSIS — Z86711 Personal history of pulmonary embolism: Secondary | ICD-10-CM | POA: Diagnosis not present

## 2012-10-08 DIAGNOSIS — Z7901 Long term (current) use of anticoagulants: Secondary | ICD-10-CM | POA: Diagnosis not present

## 2012-10-22 DIAGNOSIS — I82409 Acute embolism and thrombosis of unspecified deep veins of unspecified lower extremity: Secondary | ICD-10-CM | POA: Diagnosis not present

## 2012-10-31 DIAGNOSIS — Z7901 Long term (current) use of anticoagulants: Secondary | ICD-10-CM | POA: Diagnosis not present

## 2012-10-31 DIAGNOSIS — Z86711 Personal history of pulmonary embolism: Secondary | ICD-10-CM | POA: Diagnosis not present

## 2012-11-28 DIAGNOSIS — I82409 Acute embolism and thrombosis of unspecified deep veins of unspecified lower extremity: Secondary | ICD-10-CM | POA: Diagnosis not present

## 2012-12-17 DIAGNOSIS — H40039 Anatomical narrow angle, unspecified eye: Secondary | ICD-10-CM | POA: Diagnosis not present

## 2012-12-17 DIAGNOSIS — H2510 Age-related nuclear cataract, unspecified eye: Secondary | ICD-10-CM | POA: Diagnosis not present

## 2013-01-01 DIAGNOSIS — I82409 Acute embolism and thrombosis of unspecified deep veins of unspecified lower extremity: Secondary | ICD-10-CM | POA: Diagnosis not present

## 2013-01-30 DIAGNOSIS — I82409 Acute embolism and thrombosis of unspecified deep veins of unspecified lower extremity: Secondary | ICD-10-CM | POA: Diagnosis not present

## 2013-03-05 DIAGNOSIS — I82409 Acute embolism and thrombosis of unspecified deep veins of unspecified lower extremity: Secondary | ICD-10-CM | POA: Diagnosis not present

## 2013-03-07 DIAGNOSIS — I10 Essential (primary) hypertension: Secondary | ICD-10-CM | POA: Diagnosis not present

## 2013-03-07 DIAGNOSIS — Z23 Encounter for immunization: Secondary | ICD-10-CM | POA: Diagnosis not present

## 2013-03-07 DIAGNOSIS — E782 Mixed hyperlipidemia: Secondary | ICD-10-CM | POA: Diagnosis not present

## 2013-03-07 DIAGNOSIS — Z79899 Other long term (current) drug therapy: Secondary | ICD-10-CM | POA: Diagnosis not present

## 2013-03-07 DIAGNOSIS — Z7901 Long term (current) use of anticoagulants: Secondary | ICD-10-CM | POA: Diagnosis not present

## 2013-03-07 DIAGNOSIS — E785 Hyperlipidemia, unspecified: Secondary | ICD-10-CM | POA: Diagnosis not present

## 2013-03-07 DIAGNOSIS — I2699 Other pulmonary embolism without acute cor pulmonale: Secondary | ICD-10-CM | POA: Diagnosis not present

## 2013-03-21 DIAGNOSIS — Z7901 Long term (current) use of anticoagulants: Secondary | ICD-10-CM | POA: Diagnosis not present

## 2013-03-21 DIAGNOSIS — R944 Abnormal results of kidney function studies: Secondary | ICD-10-CM | POA: Diagnosis not present

## 2013-03-21 DIAGNOSIS — R0602 Shortness of breath: Secondary | ICD-10-CM | POA: Diagnosis not present

## 2013-04-16 DIAGNOSIS — I82409 Acute embolism and thrombosis of unspecified deep veins of unspecified lower extremity: Secondary | ICD-10-CM | POA: Diagnosis not present

## 2013-04-21 DIAGNOSIS — I4891 Unspecified atrial fibrillation: Secondary | ICD-10-CM | POA: Diagnosis not present

## 2013-05-14 DIAGNOSIS — I82409 Acute embolism and thrombosis of unspecified deep veins of unspecified lower extremity: Secondary | ICD-10-CM | POA: Diagnosis not present

## 2013-06-11 DIAGNOSIS — I82409 Acute embolism and thrombosis of unspecified deep veins of unspecified lower extremity: Secondary | ICD-10-CM | POA: Diagnosis not present

## 2013-06-12 DIAGNOSIS — N390 Urinary tract infection, site not specified: Secondary | ICD-10-CM | POA: Diagnosis not present

## 2013-06-12 DIAGNOSIS — N323 Diverticulum of bladder: Secondary | ICD-10-CM | POA: Diagnosis not present

## 2013-06-12 DIAGNOSIS — Z85528 Personal history of other malignant neoplasm of kidney: Secondary | ICD-10-CM | POA: Diagnosis not present

## 2013-06-12 DIAGNOSIS — Z905 Acquired absence of kidney: Secondary | ICD-10-CM | POA: Diagnosis not present

## 2013-06-12 DIAGNOSIS — Q619 Cystic kidney disease, unspecified: Secondary | ICD-10-CM | POA: Diagnosis not present

## 2013-06-12 DIAGNOSIS — N289 Disorder of kidney and ureter, unspecified: Secondary | ICD-10-CM | POA: Diagnosis not present

## 2013-06-12 DIAGNOSIS — C649 Malignant neoplasm of unspecified kidney, except renal pelvis: Secondary | ICD-10-CM | POA: Diagnosis not present

## 2013-06-12 DIAGNOSIS — N281 Cyst of kidney, acquired: Secondary | ICD-10-CM | POA: Diagnosis not present

## 2013-06-12 DIAGNOSIS — R918 Other nonspecific abnormal finding of lung field: Secondary | ICD-10-CM | POA: Diagnosis not present

## 2013-06-12 DIAGNOSIS — R82998 Other abnormal findings in urine: Secondary | ICD-10-CM | POA: Diagnosis not present

## 2013-06-13 DIAGNOSIS — N390 Urinary tract infection, site not specified: Secondary | ICD-10-CM | POA: Diagnosis not present

## 2013-06-13 DIAGNOSIS — N289 Disorder of kidney and ureter, unspecified: Secondary | ICD-10-CM | POA: Diagnosis not present

## 2013-06-13 DIAGNOSIS — R339 Retention of urine, unspecified: Secondary | ICD-10-CM | POA: Diagnosis not present

## 2013-06-25 DIAGNOSIS — E782 Mixed hyperlipidemia: Secondary | ICD-10-CM | POA: Diagnosis not present

## 2013-06-25 DIAGNOSIS — I10 Essential (primary) hypertension: Secondary | ICD-10-CM | POA: Diagnosis not present

## 2013-06-30 DIAGNOSIS — R944 Abnormal results of kidney function studies: Secondary | ICD-10-CM | POA: Diagnosis not present

## 2013-06-30 DIAGNOSIS — E875 Hyperkalemia: Secondary | ICD-10-CM | POA: Diagnosis not present

## 2013-06-30 DIAGNOSIS — I10 Essential (primary) hypertension: Secondary | ICD-10-CM | POA: Diagnosis not present

## 2013-07-03 DIAGNOSIS — E875 Hyperkalemia: Secondary | ICD-10-CM | POA: Diagnosis not present

## 2013-07-03 DIAGNOSIS — N289 Disorder of kidney and ureter, unspecified: Secondary | ICD-10-CM | POA: Diagnosis not present

## 2013-07-09 DIAGNOSIS — I82409 Acute embolism and thrombosis of unspecified deep veins of unspecified lower extremity: Secondary | ICD-10-CM | POA: Diagnosis not present

## 2013-07-28 DIAGNOSIS — N289 Disorder of kidney and ureter, unspecified: Secondary | ICD-10-CM | POA: Diagnosis not present

## 2013-07-28 DIAGNOSIS — Z87891 Personal history of nicotine dependence: Secondary | ICD-10-CM | POA: Diagnosis not present

## 2013-07-28 DIAGNOSIS — R339 Retention of urine, unspecified: Secondary | ICD-10-CM | POA: Diagnosis not present

## 2013-07-28 DIAGNOSIS — N390 Urinary tract infection, site not specified: Secondary | ICD-10-CM | POA: Diagnosis not present

## 2013-08-05 DIAGNOSIS — D649 Anemia, unspecified: Secondary | ICD-10-CM | POA: Diagnosis not present

## 2013-08-06 DIAGNOSIS — I82409 Acute embolism and thrombosis of unspecified deep veins of unspecified lower extremity: Secondary | ICD-10-CM | POA: Diagnosis not present

## 2013-08-11 ENCOUNTER — Encounter (HOSPITAL_COMMUNITY): Payer: Self-pay | Admitting: Emergency Medicine

## 2013-08-11 ENCOUNTER — Emergency Department (HOSPITAL_COMMUNITY): Payer: Medicare Other

## 2013-08-11 ENCOUNTER — Emergency Department (HOSPITAL_COMMUNITY)
Admission: EM | Admit: 2013-08-11 | Discharge: 2013-08-12 | Disposition: A | Discharge status: Home / Self Care | Payer: Medicare Other | Attending: Emergency Medicine | Admitting: Emergency Medicine

## 2013-08-11 DIAGNOSIS — Z8701 Personal history of pneumonia (recurrent): Secondary | ICD-10-CM | POA: Insufficient documentation

## 2013-08-11 DIAGNOSIS — Z8639 Personal history of other endocrine, nutritional and metabolic disease: Secondary | ICD-10-CM | POA: Insufficient documentation

## 2013-08-11 DIAGNOSIS — Z90710 Acquired absence of both cervix and uterus: Secondary | ICD-10-CM | POA: Insufficient documentation

## 2013-08-11 DIAGNOSIS — Z86718 Personal history of other venous thrombosis and embolism: Secondary | ICD-10-CM | POA: Insufficient documentation

## 2013-08-11 DIAGNOSIS — M129 Arthropathy, unspecified: Secondary | ICD-10-CM | POA: Insufficient documentation

## 2013-08-11 DIAGNOSIS — Z79899 Other long term (current) drug therapy: Secondary | ICD-10-CM | POA: Insufficient documentation

## 2013-08-11 DIAGNOSIS — R0602 Shortness of breath: Secondary | ICD-10-CM | POA: Diagnosis not present

## 2013-08-11 DIAGNOSIS — Z86711 Personal history of pulmonary embolism: Secondary | ICD-10-CM | POA: Diagnosis not present

## 2013-08-11 DIAGNOSIS — Z85528 Personal history of other malignant neoplasm of kidney: Secondary | ICD-10-CM | POA: Diagnosis not present

## 2013-08-11 DIAGNOSIS — Z7901 Long term (current) use of anticoagulants: Secondary | ICD-10-CM | POA: Diagnosis not present

## 2013-08-11 DIAGNOSIS — N390 Urinary tract infection, site not specified: Secondary | ICD-10-CM

## 2013-08-11 DIAGNOSIS — Z87891 Personal history of nicotine dependence: Secondary | ICD-10-CM | POA: Diagnosis not present

## 2013-08-11 DIAGNOSIS — I10 Essential (primary) hypertension: Secondary | ICD-10-CM | POA: Insufficient documentation

## 2013-08-11 DIAGNOSIS — Z9889 Other specified postprocedural states: Secondary | ICD-10-CM | POA: Insufficient documentation

## 2013-08-11 DIAGNOSIS — R109 Unspecified abdominal pain: Secondary | ICD-10-CM | POA: Diagnosis not present

## 2013-08-11 DIAGNOSIS — Z9089 Acquired absence of other organs: Secondary | ICD-10-CM | POA: Diagnosis not present

## 2013-08-11 DIAGNOSIS — Z862 Personal history of diseases of the blood and blood-forming organs and certain disorders involving the immune mechanism: Secondary | ICD-10-CM | POA: Diagnosis not present

## 2013-08-11 DIAGNOSIS — R079 Chest pain, unspecified: Secondary | ICD-10-CM | POA: Diagnosis not present

## 2013-08-11 LAB — URINALYSIS, ROUTINE W REFLEX MICROSCOPIC
Bilirubin Urine: NEGATIVE
GLUCOSE, UA: NEGATIVE mg/dL
Ketones, ur: NEGATIVE mg/dL
NITRITE: NEGATIVE
PROTEIN: 30 mg/dL — AB
SPECIFIC GRAVITY, URINE: 1.015 (ref 1.005–1.030)
Urobilinogen, UA: 0.2 mg/dL (ref 0.0–1.0)
pH: 8 (ref 5.0–8.0)

## 2013-08-11 LAB — COMPREHENSIVE METABOLIC PANEL
ALBUMIN: 3.9 g/dL (ref 3.5–5.2)
ALK PHOS: 100 U/L (ref 39–117)
ALT: 9 U/L (ref 0–35)
AST: 21 U/L (ref 0–37)
BUN: 41 mg/dL — ABNORMAL HIGH (ref 6–23)
CALCIUM: 9.8 mg/dL (ref 8.4–10.5)
CO2: 23 mEq/L (ref 19–32)
Chloride: 103 mEq/L (ref 96–112)
Creatinine, Ser: 1.67 mg/dL — ABNORMAL HIGH (ref 0.50–1.10)
GFR calc Af Amer: 33 mL/min — ABNORMAL LOW (ref >90)
GFR calc non Af Amer: 29 mL/min — ABNORMAL LOW (ref >90)
Glucose, Bld: 107 mg/dL — ABNORMAL HIGH (ref 70–99)
POTASSIUM: 4 meq/L (ref 3.7–5.3)
SODIUM: 141 meq/L (ref 137–147)
TOTAL PROTEIN: 7.6 g/dL (ref 6.0–8.3)
Total Bilirubin: 0.7 mg/dL (ref 0.3–1.2)

## 2013-08-11 LAB — CBC WITH DIFFERENTIAL/PLATELET
BASOS ABS: 0 10*3/uL (ref 0.0–0.1)
BASOS PCT: 1 % (ref 0–1)
EOS ABS: 0.1 10*3/uL (ref 0.0–0.7)
Eosinophils Relative: 2 % (ref 0–5)
HCT: 38.7 % (ref 36.0–46.0)
HEMOGLOBIN: 12.1 g/dL (ref 12.0–15.0)
Lymphocytes Relative: 32 % (ref 12–46)
Lymphs Abs: 1.6 10*3/uL (ref 0.7–4.0)
MCH: 29.7 pg (ref 26.0–34.0)
MCHC: 31.3 g/dL (ref 30.0–36.0)
MCV: 95.1 fL (ref 78.0–100.0)
MONOS PCT: 7 % (ref 3–12)
Monocytes Absolute: 0.4 10*3/uL (ref 0.1–1.0)
NEUTROS ABS: 2.8 10*3/uL (ref 1.7–7.7)
NEUTROS PCT: 58 % (ref 43–77)
PLATELETS: 213 10*3/uL (ref 150–400)
RBC: 4.07 MIL/uL (ref 3.87–5.11)
RDW: 14.4 % (ref 11.5–15.5)
WBC: 4.9 10*3/uL (ref 4.0–10.5)

## 2013-08-11 LAB — PROTIME-INR
INR: 1.99 — ABNORMAL HIGH (ref 0.00–1.49)
Prothrombin Time: 22 seconds — ABNORMAL HIGH (ref 11.6–15.2)

## 2013-08-11 LAB — URINE MICROSCOPIC-ADD ON

## 2013-08-11 LAB — D-DIMER, QUANTITATIVE (NOT AT ARMC): D DIMER QUANT: 1.45 ug{FEU}/mL — AB (ref 0.00–0.48)

## 2013-08-11 MED ORDER — TRAMADOL HCL 50 MG PO TABS
50.0000 mg | ORAL_TABLET | Freq: Once | ORAL | Status: AC
  Administered 2013-08-11: 50 mg via ORAL
  Filled 2013-08-11: qty 1

## 2013-08-11 NOTE — ED Notes (Signed)
Pt in BR when called

## 2013-08-11 NOTE — ED Notes (Signed)
Pain lt flank, onset Friday, says last time she had this it was a blood clot.   No N/v,

## 2013-08-11 NOTE — ED Provider Notes (Addendum)
CSN: [id number]     Arrival date & time 08/11/13  [id number] History   First MD Initiated Contact with Patient 08/11/13 1938     Chief Complaint  Patient presents with  . Flank Pain     (Consider location/radiation/quality/duration/timing/severity/associated sxs/prior Treatment) HPI  Lisa Lucas is a 78 y.o. female who is here for evaluation of left flank pain. The pain has been present for one week, and improves somewhat when she takes Tylenol. The pain is worse with walking, movement, and deep breathing. She denies shortness of breath, cough, fever, chills, nausea, vomiting, weakness, or dizziness. She had similar pain in the past, when she had a pulmonary embolism. She continues to take her Coumadin, and checks her INR, at home. She is using her other medicines as prescribed. There are no other known modifying factors.    Past Medical History  Diagnosis Date  . Hypertension   . DVT (deep venous thrombosis) 12/2010    right lower extremity and PE but also had prior DVT and had been on coumadin but converted to plavix/asa due to "reaction" until she presented with another DVT/PE in 12/2010.;  LLE DVT 02/2012  . Hypercholesteremia   . Osteoporosis   . Pulmonary embolism 12/2010  . Spinal stenosis   . Arthritis   . Pneumonia     recent  1 month ago  . Peripheral vascular disease   . PONV (postoperative nausea and vomiting)     has difficulty breathing coming out of anesthesia,  . History of kidney cancer   . Shortness of breath   . Cancer     kidney bil    Past Surgical History  Procedure Laterality Date  . Abdominal hysterectomy    . Hernia repair    . Appendectomy    . Kidney surgery  2010    bilateral renal cancer s/p bilateral partial nephrectomy  . Leg surgery      right leg for blood clot  . Colonoscopy  12/29/2011    Procedure: COLONOSCOPY;  Surgeon: Danie Binder, MD;  [facility];  Service: Endoscopy;  Laterality: N/A;  11:30  . Lumbar  laminectomy/decompression microdiscectomy  01/30/2012    Procedure: LUMBAR LAMINECTOMY/DECOMPRESSION MICRODISCECTOMY 1 LEVEL;  Surgeon: Floyce Stakes, MD;  Location: [place] [facility];  Service: Neurosurgery;  Laterality: Left;  Left Lumbar Four-Five Diskectomy  . Back surgery    . Lumbar laminectomy  04/16/2012    Dr Joya Salm  . Lumbar laminectomy/decompression microdiscectomy  04/16/2012    Procedure: LUMBAR LAMINECTOMY/DECOMPRESSION MICRODISCECTOMY 1 LEVEL;  Surgeon: Floyce Stakes, MD;  Location: [place] [facility];  Service: Neurosurgery;  Laterality: Left;  Left Lumbar four-five Redo Diskectomy   Family History  Problem Relation Age of Onset  . Colon cancer Daughter     age 73s  . Colon cancer Brother     age 22   History  Substance Use Topics  . Smoking status: Former Smoker -- 0.25 packs/day for 60 years    Types: Cigarettes    Quit date: 12/10/2010  . Smokeless tobacco: Never Used  . Alcohol Use: No     Comment: quit long time ago   OB History   Grav Para Term Preterm Abortions TAB SAB Ect Mult Living                 Review of Systems  All other systems reviewed and are negative.     Allergies  Hydrocodone and Oxycodone  Home Medications  Prior to Admission medications   Medication Sig Start Date End Date Taking? Authorizing Provider  acetaminophen (TYLENOL) 500 MG tablet Take 1,000 mg by mouth every 6 (six) hours as needed. For pain    Historical Provider, MD  albuterol (PROVENTIL) (5 MG/ML) 0.5% nebulizer solution Take 0.5 mLs (2.5 mg total) by nebulization every 2 (two) hours as needed for wheezing or shortness of breath. 04/20/12   Kristeen Miss, MD  Calcium Carb-Cholecalciferol (CALCIUM 600/VITAMIN D3) 600-800 MG-UNIT TABS Take 1 tablet by mouth daily.    Historical Provider, MD  diazepam (VALIUM) 5 MG tablet Take 1 tablet (5 mg total) by mouth every 6 (six) hours as needed. 04/20/12   Erline Hau, MD  diphenhydramine-acetaminophen (TYLENOL PM) 25-500 MG  TABS Take 1 tablet by mouth daily as needed. Takes twice weekly as needed for sleep    Historical Provider, MD  enoxaparin (LOVENOX) 120 MG/0.8ML injection Inject 0.8 mLs (120 mg total) into the skin daily. 04/20/12   Erline Hau, MD  gabapentin (NEURONTIN) 300 MG capsule Take 300 mg by mouth Three times a day.  12/15/11   Historical Provider, MD  guaiFENesin-codeine (ROBITUSSIN AC) 100-10 MG/5ML syrup Take 5 mLs by mouth 3 (three) times daily as needed. For cough    Historical Provider, MD  HYDROmorphone (DILAUDID) 2 MG tablet Take 1 tablet (2 mg total) by mouth every 3 (three) hours as needed. 04/20/12   Erline Hau, MD  Magnesium 250 MG TABS Take 1 tablet by mouth daily.    Historical Provider, MD  ondansetron (ZOFRAN) 4 MG tablet Take 4 mg by mouth every 6 (six) hours as needed. nausea    Historical Provider, MD  warfarin (COUMADIN) 5 MG tablet Take 2.5-5 mg by mouth daily. *Take one-half tablet by mouth on Sundays and Tuesdays, then take one tablet on all other days 12/15/11   Historical Provider, MD  warfarin (COUMADIN) 7.5 MG tablet Take 1 tablet (7.5 mg total) by mouth one time only at 6 PM. 04/20/12   Kristeen Miss, MD  Warfarin - Pharmacist Dosing Inpatient [place] to dose Coumadin 04/20/12   Kristeen Miss, MD   BP 174/91  Pulse 91  Temp(Src) 98.2 F (36.8 C) (Oral)  Resp 20  Ht 5' 5.5" (1.664 m)  Wt 178 lb (80.74 kg)  BMI 29.16 kg/m2  SpO2 96% Physical Exam  Nursing note and vitals reviewed. Constitutional: She is oriented to person, place, and time. She appears well-developed and well-nourished.  HENT:  Head: Normocephalic and atraumatic.  Eyes: Conjunctivae and EOM are normal. Pupils are equal, round, and reactive to light.  Neck: Normal range of motion and phonation normal. Neck supple.  Cardiovascular: Normal rate, regular rhythm and intact distal pulses.   Pulmonary/Chest: Effort normal and breath sounds normal. No respiratory distress. She has no  wheezes. She exhibits no tenderness.  Abdominal: Soft. She exhibits no distension and no mass. There is no tenderness. There is no guarding.  Musculoskeletal: Normal range of motion.  Mild left flank tenderness. Mild left lumbar tenderness. There are no associated deformities, or areas of crepitation.  Neurological: She is alert and oriented to person, place, and time. She exhibits normal muscle tone.  Skin: Skin is warm and dry.  Psychiatric: She has a normal mood and affect. Her behavior is normal. Judgment and thought content normal.    ED Course  Procedures (including critical care time) Medications  traMADol (ULTRAM) tablet 50 mg (50 mg Oral Given 08/11/13  2153)    Patient Vitals for the past 24 hrs:  BP Temp Temp src Pulse Resp SpO2 Height Weight  08/11/13 1926 174/91 mmHg 98.2 F (36.8 C) Oral 91 20 96 % 5' 5.5" (1.664 m) 178 lb (80.74 kg)   Nuclear medicine scan, ordered to evaluate for pulmonary embolus; because the d-dimer is elevated, and she has left flank pain. There is a low risk, possibility for left lower lobe pulmonary embolus.  10:00 PM Reevaluation with update and discussion. After initial assessment and treatment, an updated evaluation reveals patient's daughter is here now, and is concerned that she may have a UTI, because 2 weeks ago she was having to do self catheterizations. Since then, she has seen her urologist, who told her to stop doing that, there are no additional complaints. [place] Review Labs Reviewed  D-DIMER, QUANTITATIVE - Abnormal; Notable for the following:    D-Dimer, Quant 1.45 (*)    All other components within normal limits  COMPREHENSIVE METABOLIC PANEL - Abnormal; Notable for the following:    Glucose, Bld 107 (*)    BUN 41 (*)    Creatinine, Ser 1.67 (*)    GFR calc non Af Amer 29 (*)    GFR calc Af Amer 33 (*)    All other components within normal limits  PROTIME-INR - Abnormal; Notable for the following:     Prothrombin Time 22.0 (*)    INR 1.99 (*)    All other components within normal limits  CBC WITH DIFFERENTIAL  URINALYSIS, ROUTINE W REFLEX MICROSCOPIC    Imaging Review Dg Chest 2 View  08/11/2013   CLINICAL DATA:  Short of breath.  Flank pain  EXAM: CHEST  2 VIEW  COMPARISON:  04/12/2012  FINDINGS: Chronic elevation right hemidiaphragm. Bibasilar scarring. Negative for heart failure or pneumonia. No pleural effusion.  IMPRESSION: No active cardiopulmonary disease.   Electronically Signed   By: Franchot Gallo M.D.   On: 08/11/2013 21:59     EKG Interpretation   Date/Time:  Monday Aug 11 2013 20:54:31 EDT Ventricular Rate:  82 PR Interval:  145 QRS Duration: 78 QT Interval:  376 QTC Calculation: 439 R Axis:   64 Text Interpretation:  Sinus rhythm Borderline T wave abnormalities since  last tracing no significant change Confirmed by Lekesha Claw  MD, Shonia Skilling [id number])  on 08/11/2013 10:20:39 PM      MDM   Final diagnoses:  Flank pain    Nonspecific left flank pain, reproducible muscle wall tenderness. She's not had any respiratory symptoms or urinary tract symptoms, currently. Her d-dimer, ordered as a protocol test, by nursing, is elevated. She will require additional evaluation for that. Her INR, is near normal.  Nursing Notes Reviewed/ Care Coordinated, and agree without changes. Applicable Imaging Reviewed.  Interpretation of Laboratory Data incorporated into ED treatment  Disposition as per Dr. Betsey Holiday after return of testing.    Richarda Blade, MD 08/11/13 2217  Richarda Blade, MD 08/11/13 2222

## 2013-08-12 ENCOUNTER — Emergency Department (HOSPITAL_COMMUNITY): Payer: Medicare Other

## 2013-08-12 ENCOUNTER — Encounter (HOSPITAL_COMMUNITY): Payer: Self-pay

## 2013-08-12 DIAGNOSIS — R079 Chest pain, unspecified: Secondary | ICD-10-CM | POA: Diagnosis not present

## 2013-08-12 MED ORDER — ONDANSETRON 8 MG PO TBDP
ORAL_TABLET | ORAL | Status: AC
  Filled 2013-08-12: qty 1

## 2013-08-12 MED ORDER — DEXTROSE 5 % IV SOLN
1.0000 g | Freq: Once | INTRAVENOUS | Status: AC
  Administered 2013-08-12: 1 g via INTRAVENOUS
  Filled 2013-08-12: qty 10

## 2013-08-12 MED ORDER — TECHNETIUM TO 99M ALBUMIN AGGREGATED
6.0000 | Freq: Once | INTRAVENOUS | Status: AC | PRN
  Administered 2013-08-12: 6 via INTRAVENOUS

## 2013-08-12 MED ORDER — MORPHINE SULFATE 4 MG/ML IJ SOLN
4.0000 mg | Freq: Once | INTRAMUSCULAR | Status: AC
  Administered 2013-08-12: 4 mg via INTRAVENOUS
  Filled 2013-08-12: qty 1

## 2013-08-12 MED ORDER — CEPHALEXIN 500 MG PO CAPS: 500.0000 mg | ORAL_CAPSULE | Freq: Four times a day (QID) | ORAL | Status: DC

## 2013-08-12 MED ORDER — TECHNETIUM TC 99M DIETHYLENETRIAME-PENTAACETIC ACID
40.0000 | Freq: Once | INTRAVENOUS | Status: AC | PRN
  Administered 2013-08-12: 40 via RESPIRATORY_TRACT

## 2013-08-12 MED ORDER — ONDANSETRON 8 MG PO TBDP
8.0000 mg | ORAL_TABLET | Freq: Once | ORAL | Status: AC
  Administered 2013-08-12: 8 mg via ORAL

## 2013-08-12 NOTE — ED Provider Notes (Signed)
Care soon from Drs Betsey Holiday and Eulis Foster at shift change. Patient awaiting a VQ scan for rule out of pulmonary embolism. This test was performed it is low probability. The patient is on Coumadin with a nearly therapeutic INR and I strongly doubt pulmonary embolism. She is having pain in her left flank that may likely be related to a UTI. She is retaining urine and a Foley catheter will be left in place. She has a urologist that she sees at [facility] and I will devise her to followup with him this week to discuss her urinary retention. She does have evidence for a UTI. She was given ceftriaxone in the ER and will be discharged with Keflex. She understands to return if her symptoms substantially worsen or change.  Veryl Speak, MD 08/12/13 [phone]

## 2013-08-12 NOTE — ED Notes (Signed)
Instructed pt and her daughter in changing urine drainage bag to leg bag. Pt began feeling nauseated when she sat up. Medicated with zofran 8 mg and given some applejuice to drink. Pt felt better after resting.

## 2013-08-12 NOTE — Discharge Instructions (Signed)
Keflex as prescribed.  Followup with your urologist this week.  Return to the emergency department if he develops severe abdominal pain, high fever, and vomiting, or any other new and concerning symptoms.   Urinary Tract Infection Urinary tract infections (UTIs) can develop anywhere along your urinary tract. Your urinary tract is your body's drainage system for removing wastes and extra water. Your urinary tract includes two kidneys, two ureters, a bladder, and a urethra. Your kidneys are a pair of bean-shaped organs. Each kidney is about the size of your fist. They are located below your ribs, one on each side of your spine. CAUSES Infections are caused by microbes, which are microscopic organisms, including fungi, viruses, and bacteria. These organisms are so small that they can only be seen through a microscope. Bacteria are the microbes that most commonly cause UTIs. SYMPTOMS  Symptoms of UTIs may vary by age and gender of the patient and by the location of the infection. Symptoms in young women typically include a frequent and intense urge to urinate and a painful, burning feeling in the bladder or urethra during urination. Older women and men are more likely to be tired, shaky, and weak and have muscle aches and abdominal pain. A fever may mean the infection is in your kidneys. Other symptoms of a kidney infection include pain in your back or sides below the ribs, nausea, and vomiting. DIAGNOSIS To diagnose a UTI, your caregiver will ask you about your symptoms. Your caregiver also will ask to provide a urine sample. The urine sample will be tested for bacteria and white blood cells. White blood cells are made by your body to help fight infection. TREATMENT  Typically, UTIs can be treated with medication. Because most UTIs are caused by a bacterial infection, they usually can be treated with the use of antibiotics. The choice of antibiotic and length of treatment depend on your symptoms and the  type of bacteria causing your infection. HOME CARE INSTRUCTIONS  If you were prescribed antibiotics, take them exactly as your caregiver instructs you. Finish the medication even if you feel better after you have only taken some of the medication.  Drink enough water and fluids to keep your urine clear or pale yellow.  Avoid caffeine, tea, and carbonated beverages. They tend to irritate your bladder.  Empty your bladder often. Avoid holding urine for long periods of time.  Empty your bladder before and after sexual intercourse.  After a bowel movement, women should cleanse from front to back. Use each tissue only once. SEEK MEDICAL CARE IF:   You have back pain.  You develop a fever.  Your symptoms do not begin to resolve within 3 days. SEEK IMMEDIATE MEDICAL CARE IF:   You have severe back pain or lower abdominal pain.  You develop chills.  You have nausea or vomiting.  You have continued burning or discomfort with urination. MAKE SURE YOU:   Understand these instructions.  Will watch your condition.  Will get help right away if you are not doing well or get worse. Document Released: 12/14/2004 Document Revised: 09/05/2011 Document Reviewed: 04/14/2011 [facility] Patient Information 2014 [place].

## 2013-08-12 NOTE — ED Notes (Signed)
Bladder scan done, shows 574cc urine residual in bladder post void. Pt does not feel full, does not have urge to urinate. ERMD aware

## 2013-08-13 LAB — URINE CULTURE: Special Requests: NORMAL

## 2013-08-18 DIAGNOSIS — R339 Retention of urine, unspecified: Secondary | ICD-10-CM | POA: Diagnosis not present

## 2013-08-22 DIAGNOSIS — Z7901 Long term (current) use of anticoagulants: Secondary | ICD-10-CM | POA: Diagnosis not present

## 2013-08-22 DIAGNOSIS — N289 Disorder of kidney and ureter, unspecified: Secondary | ICD-10-CM | POA: Diagnosis not present

## 2013-08-22 DIAGNOSIS — I10 Essential (primary) hypertension: Secondary | ICD-10-CM | POA: Diagnosis not present

## 2013-08-22 DIAGNOSIS — Z86711 Personal history of pulmonary embolism: Secondary | ICD-10-CM | POA: Diagnosis not present

## 2013-08-22 DIAGNOSIS — Z23 Encounter for immunization: Secondary | ICD-10-CM | POA: Diagnosis not present

## 2013-08-22 DIAGNOSIS — D649 Anemia, unspecified: Secondary | ICD-10-CM | POA: Diagnosis not present

## 2013-09-03 DIAGNOSIS — I82409 Acute embolism and thrombosis of unspecified deep veins of unspecified lower extremity: Secondary | ICD-10-CM | POA: Diagnosis not present

## 2013-09-08 DIAGNOSIS — I10 Essential (primary) hypertension: Secondary | ICD-10-CM | POA: Diagnosis not present

## 2013-09-29 IMAGING — CR DG LUMBAR SPINE 1V
1 series · 1 of 1 positions shown · non-contrast
Comparison: MRI of the lumbar spine dated 12/05/2011

CLINICAL DATA: Localization for L4-5 discectomy.

LUMBAR SPINE - 1 VIEW

[view not recorded]
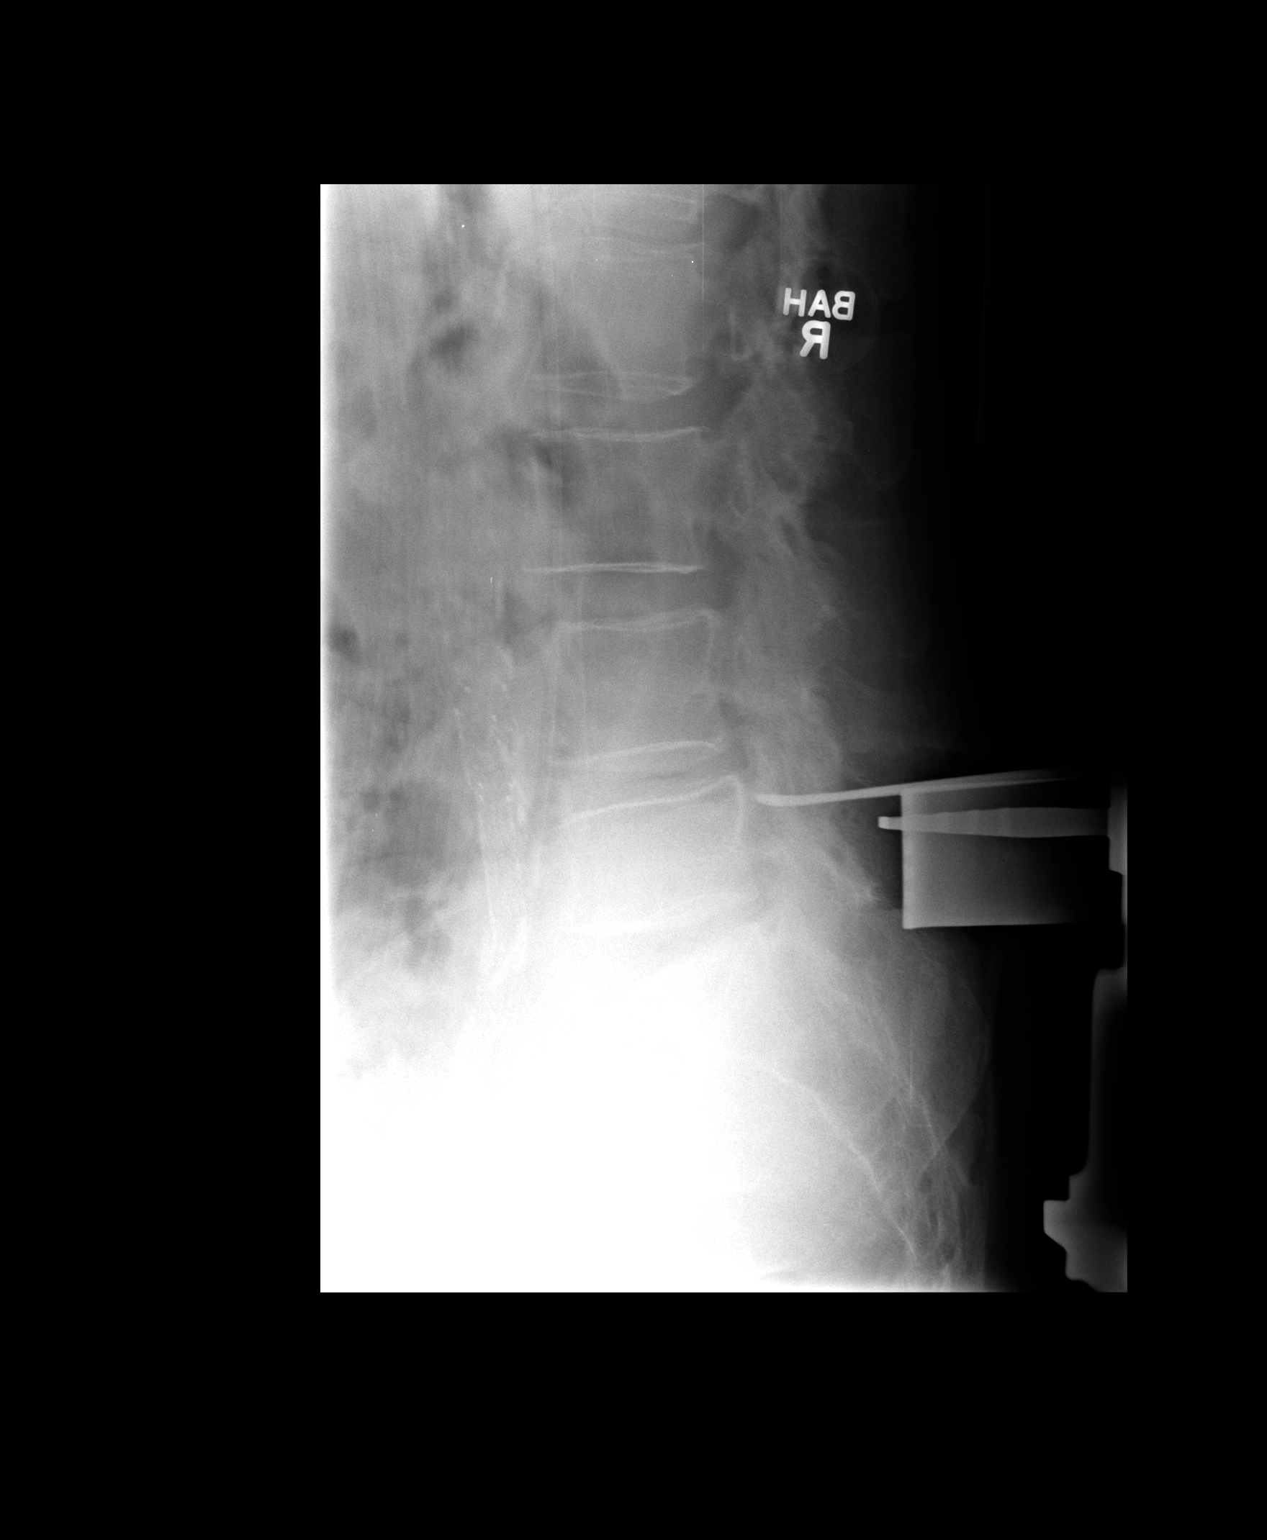

[1 of 1 positions shown; findings below may reference images not displayed]

FINDINGS: Cross-table lateral view obtained intraoperatively
demonstrates placement of posterior retractors and a metallic
instrument opposite the superior endplate of L5.
IMPRESSION: Intraoperative localization of the L4-5 disc space.

## 2013-09-29 IMAGING — CR DG CHEST 2V
2 series · 2 of 2 positions shown · non-contrast
Comparison: 01/11/2011

CLINICAL DATA: Preop for lumbar laminectomy

CHEST - 2 VIEW

[view not recorded (1 of 2)]
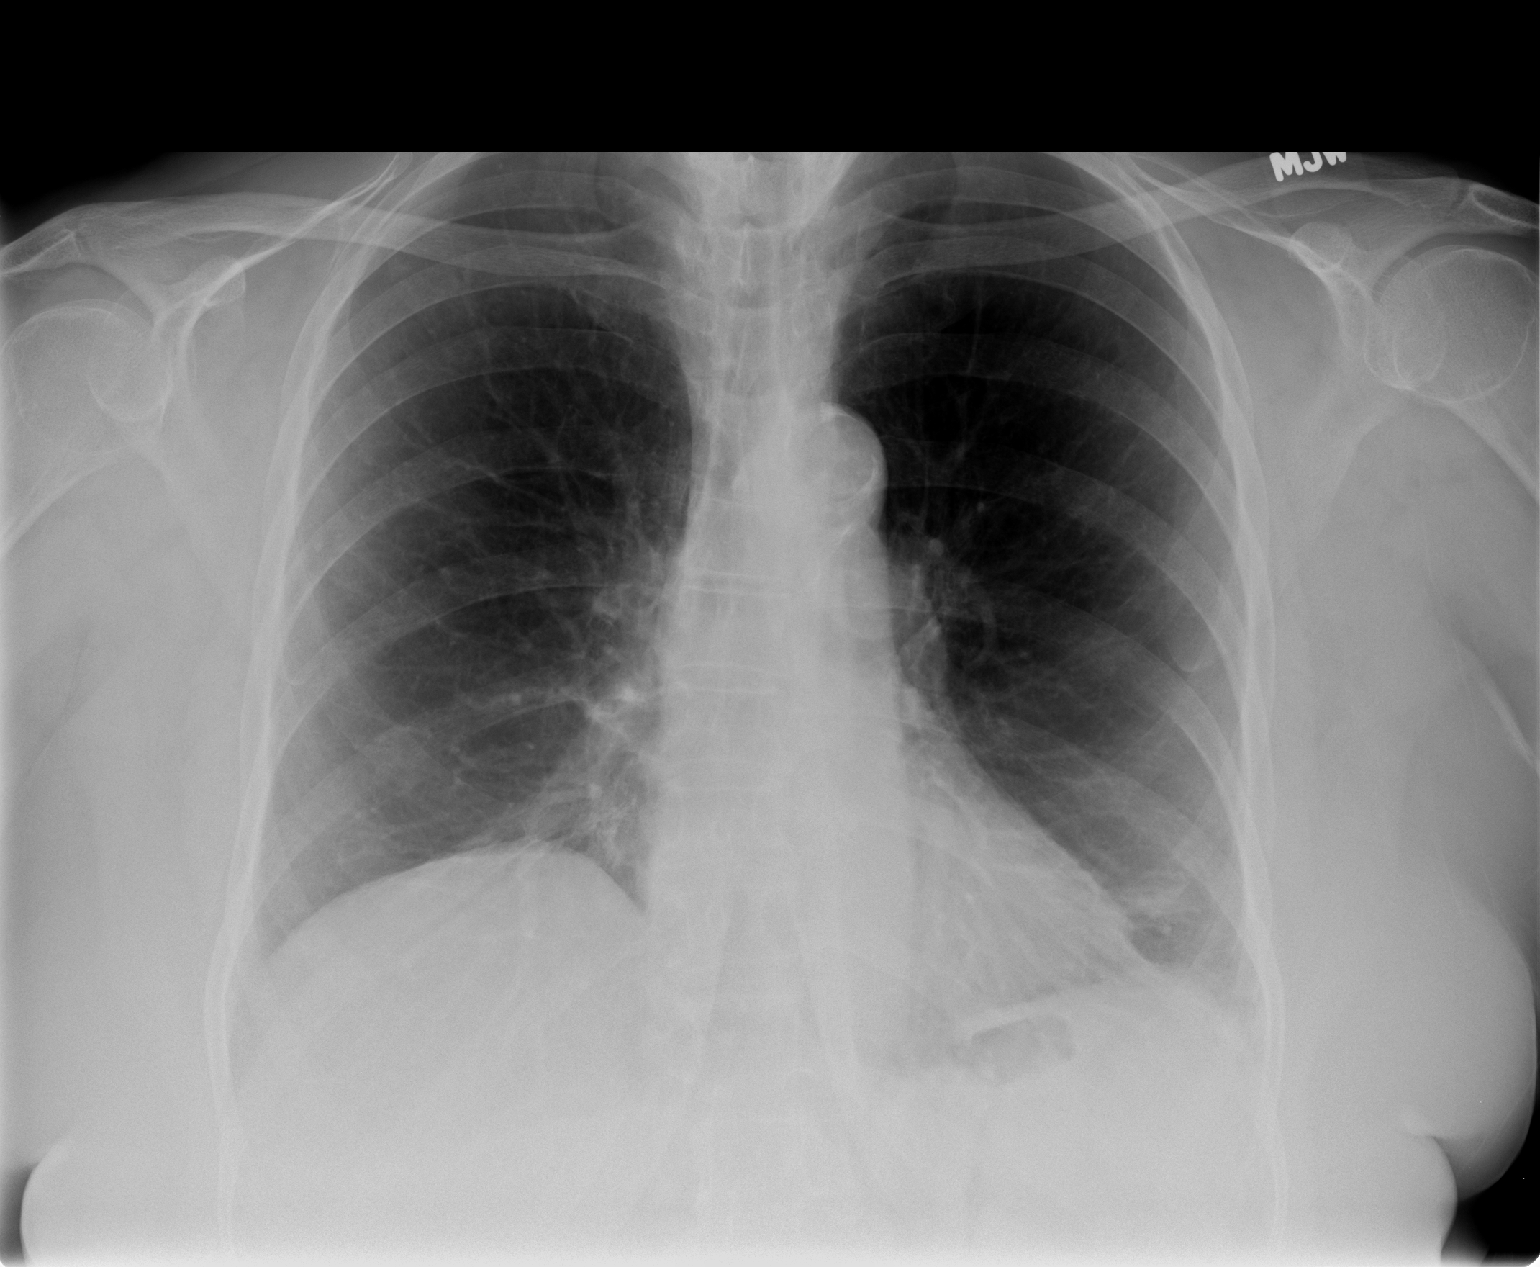

[view not recorded (2 of 2)]
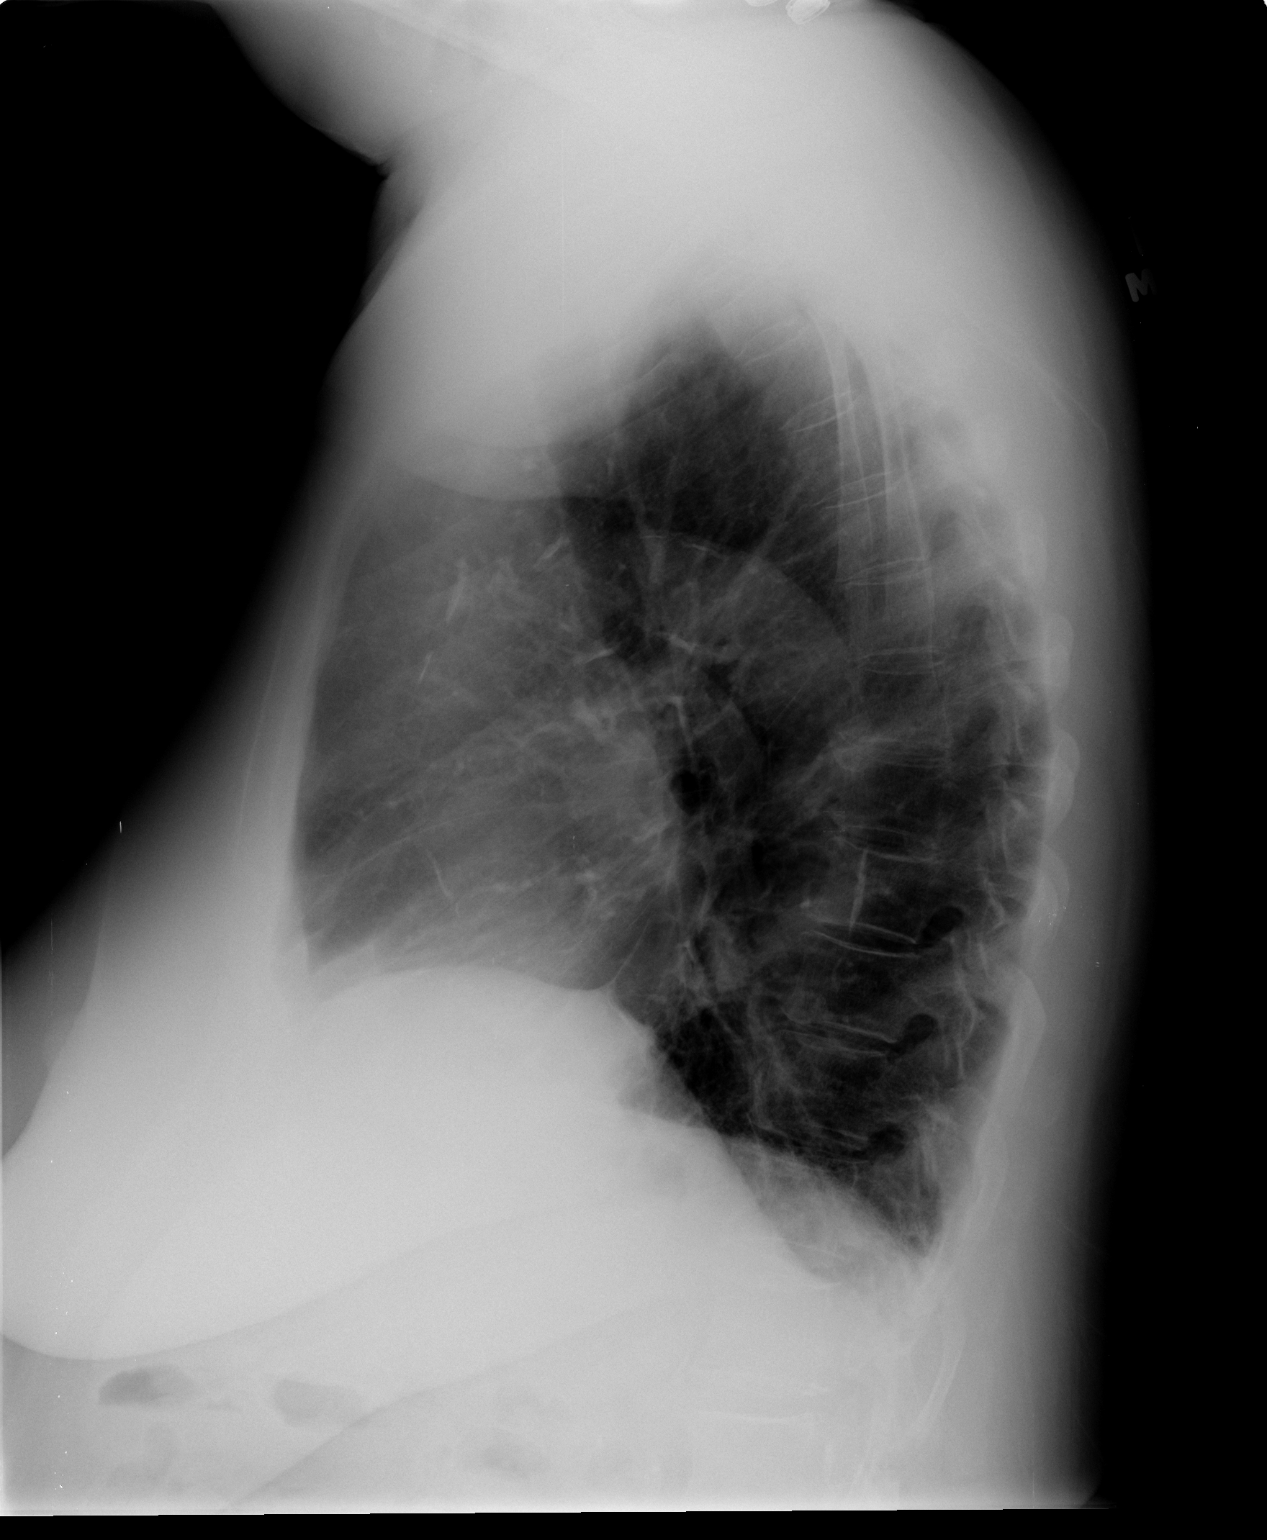

[2 of 2 positions shown; findings below may reference images not displayed]

FINDINGS: Cardiomediastinal silhouette is stable.  No acute
infiltrate or pleural effusion.  No pulmonary edema.  Mild
atherosclerotic calcifications of thoracic aorta.  Bony thorax is
stable.
IMPRESSION: No active disease.

## 2013-10-01 DIAGNOSIS — I82409 Acute embolism and thrombosis of unspecified deep veins of unspecified lower extremity: Secondary | ICD-10-CM | POA: Diagnosis not present

## 2013-10-29 DIAGNOSIS — I10 Essential (primary) hypertension: Secondary | ICD-10-CM | POA: Diagnosis not present

## 2013-10-29 DIAGNOSIS — K469 Unspecified abdominal hernia without obstruction or gangrene: Secondary | ICD-10-CM | POA: Diagnosis not present

## 2013-10-29 DIAGNOSIS — Z7901 Long term (current) use of anticoagulants: Secondary | ICD-10-CM | POA: Diagnosis not present

## 2013-10-30 DIAGNOSIS — I82409 Acute embolism and thrombosis of unspecified deep veins of unspecified lower extremity: Secondary | ICD-10-CM | POA: Diagnosis not present

## 2013-11-26 DIAGNOSIS — I82409 Acute embolism and thrombosis of unspecified deep veins of unspecified lower extremity: Secondary | ICD-10-CM | POA: Diagnosis not present

## 2013-11-27 DIAGNOSIS — R350 Frequency of micturition: Secondary | ICD-10-CM | POA: Diagnosis not present

## 2013-12-25 DIAGNOSIS — I82409 Acute embolism and thrombosis of unspecified deep veins of unspecified lower extremity: Secondary | ICD-10-CM | POA: Diagnosis not present

## 2014-01-22 DIAGNOSIS — I82409 Acute embolism and thrombosis of unspecified deep veins of unspecified lower extremity: Secondary | ICD-10-CM | POA: Diagnosis not present

## 2014-02-16 DIAGNOSIS — N390 Urinary tract infection, site not specified: Secondary | ICD-10-CM | POA: Diagnosis not present

## 2014-02-16 DIAGNOSIS — R300 Dysuria: Secondary | ICD-10-CM | POA: Diagnosis not present

## 2014-02-18 DIAGNOSIS — I82409 Acute embolism and thrombosis of unspecified deep veins of unspecified lower extremity: Secondary | ICD-10-CM | POA: Diagnosis not present

## 2014-02-27 DIAGNOSIS — I10 Essential (primary) hypertension: Secondary | ICD-10-CM | POA: Diagnosis not present

## 2014-02-27 DIAGNOSIS — N182 Chronic kidney disease, stage 2 (mild): Secondary | ICD-10-CM | POA: Diagnosis not present

## 2014-02-27 DIAGNOSIS — G4700 Insomnia, unspecified: Secondary | ICD-10-CM | POA: Diagnosis not present

## 2014-03-18 DIAGNOSIS — Z7901 Long term (current) use of anticoagulants: Secondary | ICD-10-CM | POA: Diagnosis not present

## 2014-03-18 DIAGNOSIS — I82409 Acute embolism and thrombosis of unspecified deep veins of unspecified lower extremity: Secondary | ICD-10-CM | POA: Diagnosis not present

## 2014-03-18 DIAGNOSIS — Z86718 Personal history of other venous thrombosis and embolism: Secondary | ICD-10-CM | POA: Diagnosis not present

## 2014-03-24 DIAGNOSIS — N390 Urinary tract infection, site not specified: Secondary | ICD-10-CM | POA: Diagnosis not present

## 2014-03-24 DIAGNOSIS — R319 Hematuria, unspecified: Secondary | ICD-10-CM | POA: Diagnosis not present

## 2014-04-15 DIAGNOSIS — Z86718 Personal history of other venous thrombosis and embolism: Secondary | ICD-10-CM | POA: Diagnosis not present

## 2014-04-15 DIAGNOSIS — Z7901 Long term (current) use of anticoagulants: Secondary | ICD-10-CM | POA: Diagnosis not present

## 2014-05-06 DIAGNOSIS — G4700 Insomnia, unspecified: Secondary | ICD-10-CM | POA: Diagnosis not present

## 2014-05-06 DIAGNOSIS — R300 Dysuria: Secondary | ICD-10-CM | POA: Diagnosis not present

## 2014-05-06 DIAGNOSIS — N182 Chronic kidney disease, stage 2 (mild): Secondary | ICD-10-CM | POA: Diagnosis not present

## 2014-05-06 DIAGNOSIS — I10 Essential (primary) hypertension: Secondary | ICD-10-CM | POA: Diagnosis not present

## 2014-06-09 DIAGNOSIS — N2889 Other specified disorders of kidney and ureter: Secondary | ICD-10-CM | POA: Diagnosis not present

## 2014-06-09 DIAGNOSIS — Z905 Acquired absence of kidney: Secondary | ICD-10-CM | POA: Diagnosis not present

## 2014-06-09 DIAGNOSIS — N323 Diverticulum of bladder: Secondary | ICD-10-CM | POA: Diagnosis not present

## 2014-06-09 DIAGNOSIS — C641 Malignant neoplasm of right kidney, except renal pelvis: Secondary | ICD-10-CM | POA: Diagnosis not present

## 2014-06-09 DIAGNOSIS — Z87440 Personal history of urinary (tract) infections: Secondary | ICD-10-CM | POA: Diagnosis not present

## 2014-06-10 DIAGNOSIS — Z86718 Personal history of other venous thrombosis and embolism: Secondary | ICD-10-CM | POA: Diagnosis not present

## 2014-06-10 DIAGNOSIS — Z7901 Long term (current) use of anticoagulants: Secondary | ICD-10-CM | POA: Diagnosis not present

## 2014-07-20 DIAGNOSIS — N323 Diverticulum of bladder: Secondary | ICD-10-CM | POA: Diagnosis not present

## 2014-07-20 DIAGNOSIS — Z87891 Personal history of nicotine dependence: Secondary | ICD-10-CM | POA: Diagnosis not present

## 2014-07-20 DIAGNOSIS — I10 Essential (primary) hypertension: Secondary | ICD-10-CM | POA: Diagnosis not present

## 2014-07-20 DIAGNOSIS — N390 Urinary tract infection, site not specified: Secondary | ICD-10-CM | POA: Diagnosis not present

## 2014-07-20 DIAGNOSIS — R339 Retention of urine, unspecified: Secondary | ICD-10-CM | POA: Diagnosis not present

## 2014-07-23 DIAGNOSIS — Z7901 Long term (current) use of anticoagulants: Secondary | ICD-10-CM | POA: Diagnosis not present

## 2014-07-23 DIAGNOSIS — Z86718 Personal history of other venous thrombosis and embolism: Secondary | ICD-10-CM | POA: Diagnosis not present

## 2014-09-24 DIAGNOSIS — Z86718 Personal history of other venous thrombosis and embolism: Secondary | ICD-10-CM | POA: Diagnosis not present

## 2014-09-24 DIAGNOSIS — Z7901 Long term (current) use of anticoagulants: Secondary | ICD-10-CM | POA: Diagnosis not present

## 2014-10-12 DIAGNOSIS — N182 Chronic kidney disease, stage 2 (mild): Secondary | ICD-10-CM | POA: Diagnosis not present

## 2014-10-12 DIAGNOSIS — I10 Essential (primary) hypertension: Secondary | ICD-10-CM | POA: Diagnosis not present

## 2014-10-12 DIAGNOSIS — G4700 Insomnia, unspecified: Secondary | ICD-10-CM | POA: Diagnosis not present

## 2014-10-13 DIAGNOSIS — N183 Chronic kidney disease, stage 3 (moderate): Secondary | ICD-10-CM | POA: Diagnosis not present

## 2014-10-13 DIAGNOSIS — I482 Chronic atrial fibrillation: Secondary | ICD-10-CM | POA: Diagnosis not present

## 2014-10-13 DIAGNOSIS — I10 Essential (primary) hypertension: Secondary | ICD-10-CM | POA: Diagnosis not present

## 2014-10-13 DIAGNOSIS — M1990 Unspecified osteoarthritis, unspecified site: Secondary | ICD-10-CM | POA: Diagnosis not present

## 2014-10-21 DIAGNOSIS — Z7901 Long term (current) use of anticoagulants: Secondary | ICD-10-CM | POA: Diagnosis not present

## 2014-10-21 DIAGNOSIS — Z86718 Personal history of other venous thrombosis and embolism: Secondary | ICD-10-CM | POA: Diagnosis not present

## 2014-11-16 DIAGNOSIS — Z87891 Personal history of nicotine dependence: Secondary | ICD-10-CM | POA: Diagnosis not present

## 2014-11-16 DIAGNOSIS — Z886 Allergy status to analgesic agent status: Secondary | ICD-10-CM | POA: Diagnosis not present

## 2014-11-16 DIAGNOSIS — Z85528 Personal history of other malignant neoplasm of kidney: Secondary | ICD-10-CM | POA: Diagnosis not present

## 2014-11-16 DIAGNOSIS — I10 Essential (primary) hypertension: Secondary | ICD-10-CM | POA: Diagnosis not present

## 2014-11-16 DIAGNOSIS — Z885 Allergy status to narcotic agent status: Secondary | ICD-10-CM | POA: Diagnosis not present

## 2014-11-16 DIAGNOSIS — Z7901 Long term (current) use of anticoagulants: Secondary | ICD-10-CM | POA: Diagnosis not present

## 2014-11-16 DIAGNOSIS — R339 Retention of urine, unspecified: Secondary | ICD-10-CM | POA: Diagnosis not present

## 2014-11-16 DIAGNOSIS — Z905 Acquired absence of kidney: Secondary | ICD-10-CM | POA: Diagnosis not present

## 2014-11-16 DIAGNOSIS — N390 Urinary tract infection, site not specified: Secondary | ICD-10-CM | POA: Diagnosis not present

## 2014-11-19 DIAGNOSIS — Z7901 Long term (current) use of anticoagulants: Secondary | ICD-10-CM | POA: Diagnosis not present

## 2014-11-19 DIAGNOSIS — Z86718 Personal history of other venous thrombosis and embolism: Secondary | ICD-10-CM | POA: Diagnosis not present

## 2014-12-17 DIAGNOSIS — Z7901 Long term (current) use of anticoagulants: Secondary | ICD-10-CM | POA: Diagnosis not present

## 2014-12-17 DIAGNOSIS — Z86718 Personal history of other venous thrombosis and embolism: Secondary | ICD-10-CM | POA: Diagnosis not present

## 2015-01-10 ENCOUNTER — Emergency Department (HOSPITAL_COMMUNITY): Payer: Medicare Other

## 2015-01-10 ENCOUNTER — Encounter (HOSPITAL_COMMUNITY): Payer: Self-pay | Admitting: Emergency Medicine

## 2015-01-10 ENCOUNTER — Emergency Department (HOSPITAL_COMMUNITY)
Admission: EM | Admit: 2015-01-10 | Discharge: 2015-01-10 | Disposition: A | Discharge status: Home / Self Care | Payer: Medicare Other | Attending: Emergency Medicine | Admitting: Emergency Medicine

## 2015-01-10 DIAGNOSIS — Z87891 Personal history of nicotine dependence: Secondary | ICD-10-CM | POA: Insufficient documentation

## 2015-01-10 DIAGNOSIS — Z8639 Personal history of other endocrine, nutritional and metabolic disease: Secondary | ICD-10-CM | POA: Insufficient documentation

## 2015-01-10 DIAGNOSIS — Z79899 Other long term (current) drug therapy: Secondary | ICD-10-CM | POA: Insufficient documentation

## 2015-01-10 DIAGNOSIS — Z8701 Personal history of pneumonia (recurrent): Secondary | ICD-10-CM | POA: Insufficient documentation

## 2015-01-10 DIAGNOSIS — M79602 Pain in left arm: Secondary | ICD-10-CM | POA: Diagnosis not present

## 2015-01-10 DIAGNOSIS — M1990 Unspecified osteoarthritis, unspecified site: Secondary | ICD-10-CM | POA: Diagnosis not present

## 2015-01-10 DIAGNOSIS — M546 Pain in thoracic spine: Secondary | ICD-10-CM | POA: Insufficient documentation

## 2015-01-10 DIAGNOSIS — R079 Chest pain, unspecified: Secondary | ICD-10-CM | POA: Diagnosis not present

## 2015-01-10 DIAGNOSIS — Z86711 Personal history of pulmonary embolism: Secondary | ICD-10-CM | POA: Insufficient documentation

## 2015-01-10 DIAGNOSIS — Z86718 Personal history of other venous thrombosis and embolism: Secondary | ICD-10-CM | POA: Diagnosis not present

## 2015-01-10 DIAGNOSIS — Z85528 Personal history of other malignant neoplasm of kidney: Secondary | ICD-10-CM | POA: Insufficient documentation

## 2015-01-10 DIAGNOSIS — Z7901 Long term (current) use of anticoagulants: Secondary | ICD-10-CM | POA: Insufficient documentation

## 2015-01-10 DIAGNOSIS — I10 Essential (primary) hypertension: Secondary | ICD-10-CM | POA: Diagnosis not present

## 2015-01-10 DIAGNOSIS — M25512 Pain in left shoulder: Secondary | ICD-10-CM | POA: Diagnosis not present

## 2015-01-10 DIAGNOSIS — R0602 Shortness of breath: Secondary | ICD-10-CM | POA: Diagnosis not present

## 2015-01-10 DIAGNOSIS — M549 Dorsalgia, unspecified: Secondary | ICD-10-CM

## 2015-01-10 LAB — TROPONIN I

## 2015-01-10 LAB — CBC WITH DIFFERENTIAL/PLATELET
BASOS ABS: 0 10*3/uL (ref 0.0–0.1)
BASOS PCT: 1 %
EOS ABS: 0.1 10*3/uL (ref 0.0–0.7)
EOS PCT: 4 %
HCT: 40.4 % (ref 36.0–46.0)
HEMOGLOBIN: 13.2 g/dL (ref 12.0–15.0)
LYMPHS ABS: 1.1 10*3/uL (ref 0.7–4.0)
Lymphocytes Relative: 31 %
MCH: 30.9 pg (ref 26.0–34.0)
MCHC: 32.7 g/dL (ref 30.0–36.0)
MCV: 94.6 fL (ref 78.0–100.0)
Monocytes Absolute: 0.3 10*3/uL (ref 0.1–1.0)
Monocytes Relative: 8 %
NEUTROS PCT: 56 %
Neutro Abs: 2 10*3/uL (ref 1.7–7.7)
PLATELETS: 175 10*3/uL (ref 150–400)
RBC: 4.27 MIL/uL (ref 3.87–5.11)
RDW: 13.8 % (ref 11.5–15.5)
WBC: 3.5 10*3/uL — AB (ref 4.0–10.5)

## 2015-01-10 LAB — BASIC METABOLIC PANEL
ANION GAP: 9 (ref 5–15)
BUN: 38 mg/dL — ABNORMAL HIGH (ref 6–20)
CHLORIDE: 109 mmol/L (ref 101–111)
CO2: 20 mmol/L — ABNORMAL LOW (ref 22–32)
Calcium: 8.9 mg/dL (ref 8.9–10.3)
Creatinine, Ser: 1.76 mg/dL — ABNORMAL HIGH (ref 0.44–1.00)
GFR calc non Af Amer: 27 mL/min — ABNORMAL LOW (ref >60)
GFR, EST AFRICAN AMERICAN: 31 mL/min — AB (ref >60)
Glucose, Bld: 95 mg/dL (ref 65–99)
POTASSIUM: 4.9 mmol/L (ref 3.5–5.1)
Sodium: 138 mmol/L (ref 135–145)

## 2015-01-10 LAB — PROTIME-INR
INR: 2.17 — AB (ref 0.00–1.49)
Prothrombin Time: 24 seconds — ABNORMAL HIGH (ref 11.6–15.2)

## 2015-01-10 LAB — D-DIMER, QUANTITATIVE (NOT AT ARMC)

## 2015-01-10 MED ORDER — ONDANSETRON HCL 4 MG/2ML IJ SOLN
4.0000 mg | Freq: Once | INTRAMUSCULAR | Status: AC
  Administered 2015-01-10: 4 mg via INTRAVENOUS
  Filled 2015-01-10: qty 2

## 2015-01-10 MED ORDER — CYCLOBENZAPRINE HCL 5 MG PO TABS: 5.0000 mg | ORAL_TABLET | Freq: Three times a day (TID) | ORAL | Status: DC | PRN

## 2015-01-10 MED ORDER — TRAMADOL HCL 50 MG PO TABS: 50.0000 mg | ORAL_TABLET | Freq: Four times a day (QID) | ORAL | Status: DC | PRN

## 2015-01-10 MED ORDER — MORPHINE SULFATE (PF) 4 MG/ML IV SOLN
4.0000 mg | Freq: Once | INTRAVENOUS | Status: AC
  Administered 2015-01-10: 4 mg via INTRAVENOUS
  Filled 2015-01-10: qty 1

## 2015-01-10 NOTE — ED Notes (Signed)
Patient c/o nausea, wanting something to eat as she has not had breakfast today.

## 2015-01-10 NOTE — ED Notes (Signed)
MD at bedside. 

## 2015-01-10 NOTE — ED Notes (Signed)
Patient complaining of left arm pain radiating into her back x 1 week. Also complaining of shortness of breath.

## 2015-01-10 NOTE — ED Notes (Signed)
Patient d/c papers and prescriptions given and reviewed. Patient verbalized understanding.

## 2015-01-10 NOTE — Discharge Instructions (Signed)
You are scheduled for a Nuclear Medicine Lung Scan at 12pm tomorrow. You may eat and drink prior to scan.    Return to the ED with any concerns including difficulty breathing, leg or arm swelling, chest pain, fainting, decreased level of alertness/lethargy, or any other alarming symptoms

## 2015-01-10 NOTE — ED Provider Notes (Signed)
CSN: [id number]     Arrival date & time 01/10/15  1013 History  By signing my name below, I, Randa Evens, attest that this documentation has been prepared under the direction and in the presence of No att. providers found. Electronically Signed: Randa Evens, ED Scribe. 01/10/2015. 2:13 PM.     Chief Complaint  Patient presents with  . Arm Pain   Patient is a 79 y.o. female presenting with arm pain. The history is provided by the patient. No language interpreter was used.  Arm Pain   HPI Comments: Lisa Lucas is a 79 y.o. female who presents to the Emergency Department complaining of left arm pain and left upper back pain that began 6 days prior. Pt states that the pain starts around her left shoulder and radiates all the way down the arm. Pt states she has a Hx of PE. She states that this pain feels similar to previous pain when she was diagnosed with a PE. Pt states that she is currently taking Coumadin. Denies recently falls or injuries.   No difficulty breathing,  Pain has been constant over the past 6 days.  There are no other associated systemic symptoms, there are no other alleviating or modifying factors.   Past Medical History  Diagnosis Date  . Hypertension   . DVT (deep venous thrombosis) ([place]) 12/2010    right lower extremity and PE but also had prior DVT and had been on coumadin but converted to plavix/asa due to "reaction" until she presented with another DVT/PE in 12/2010.;  LLE DVT 02/2012  . Hypercholesteremia   . Osteoporosis   . Pulmonary embolism ([place]) 12/2010  . Spinal stenosis   . Arthritis   . Pneumonia     recent  1 month ago  . Peripheral vascular disease ([place])   . PONV (postoperative nausea and vomiting)     has difficulty breathing coming out of anesthesia,  . History of kidney cancer   . Shortness of breath   . Cancer ([place])     kidney bil    Past Surgical History  Procedure Laterality Date  . Abdominal hysterectomy    . Hernia repair     . Appendectomy    . Kidney surgery  2010    bilateral renal cancer s/p bilateral partial nephrectomy  . Leg surgery      right leg for blood clot  . Colonoscopy  12/29/2011    Procedure: COLONOSCOPY;  Surgeon: Danie Binder, MD;  [facility];  Service: Endoscopy;  Laterality: N/A;  11:30  . Lumbar laminectomy/decompression microdiscectomy  01/30/2012    Procedure: LUMBAR LAMINECTOMY/DECOMPRESSION MICRODISCECTOMY 1 LEVEL;  Surgeon: Floyce Stakes, MD;  Location: [place] [facility];  Service: Neurosurgery;  Laterality: Left;  Left Lumbar Four-Five Diskectomy  . Back surgery    . Lumbar laminectomy  04/16/2012    Dr Joya Salm  . Lumbar laminectomy/decompression microdiscectomy  04/16/2012    Procedure: LUMBAR LAMINECTOMY/DECOMPRESSION MICRODISCECTOMY 1 LEVEL;  Surgeon: Floyce Stakes, MD;  Location: [place] [facility];  Service: Neurosurgery;  Laterality: Left;  Left Lumbar four-five Redo Diskectomy   Family History  Problem Relation Age of Onset  . Colon cancer Daughter     age 16s  . Colon cancer Brother     age 39   Social History  Substance Use Topics  . Smoking status: Former Smoker -- 0.25 packs/day for 60 years    Types: Cigarettes    Quit date: 12/10/2010  . Smokeless tobacco:  Never Used  . Alcohol Use: No     Comment: quit long time ago   OB History    No data available     Review of Systems  Musculoskeletal: Positive for back pain and arthralgias.  All other systems reviewed and are negative.     Allergies  Hydrocodone and Oxycodone  Home Medications   Prior to Admission medications   Medication Sig Start Date End Date Taking? Authorizing Provider  acetaminophen (TYLENOL) 500 MG tablet Take 1,000 mg by mouth every 6 (six) hours as needed for moderate pain.   Yes Historical Provider, MD  albuterol (PROVENTIL) (5 MG/ML) 0.5% nebulizer solution Take 0.5 mLs (2.5 mg total) by nebulization every 2 (two) hours as needed for wheezing or shortness of breath. 04/20/12   Yes Kristeen Miss, MD  Calcium Carb-Cholecalciferol (CALCIUM 600/VITAMIN D3) 600-800 MG-UNIT TABS Take 1 tablet by mouth every morning.    Yes Historical Provider, MD  warfarin (COUMADIN) 5 MG tablet Take 2.5-5 mg by mouth daily. *Takes one-half tablet (2.5mg ) on Tuesdays and Friday.  Takes whole tablet all other days. 12/15/11  Yes Historical Provider, MD  cyclobenzaprine (FLEXERIL) 5 MG tablet Take 1 tablet (5 mg total) by mouth 3 (three) times daily as needed for muscle spasms. 01/10/15   Alfonzo Beers, MD  traMADol (ULTRAM) 50 MG tablet Take 1 tablet (50 mg total) by mouth every 6 (six) hours as needed. 01/10/15   Alfonzo Beers, MD   BP 135/85 mmHg  Pulse 65  Temp(Src) 98 F (36.7 C) (Oral)  Resp 16  Ht 5\' 6"  (1.676 m)  Wt 180 lb (81.647 kg)  BMI 29.07 kg/m2  SpO2 95%  Vitals reviewed Physical Exam  Physical Examination: General appearance - alert, well appearing, and in no distress Mental status - alert, oriented to person, place, and time Eyes - no conjunctival injection, no scleral icterus Chest - clear to auscultation, no wheezes, rales or rhonchi, symmetric air entry Heart - normal rate, regular rhythm, normal S1, S2, no murmurs, rubs, clicks or gallops Abdomen - soft, nontender, nondistended, no masses or organomegaly Back exam - no midline tenderness to palpation, ttp overlying left subscapular region, pain in left arm with ROM at left shoulder Neurological - alert, oriented, normal speech, strength 5/5 in extremities x 4, sensation intact Musculoskeletal - no joint tenderness, deformity or swelling Extremities - peripheral pulses normal, no pedal edema, no clubbing or cyanosis Skin - normal coloration and turgor, no rashes  ED Course  Procedures (including critical care time) DIAGNOSTIC STUDIES: Oxygen Saturation is 98% on RA, normal by my interpretation.    COORDINATION OF CARE: 11:18 AM-Discussed treatment plan with pt at bedside and pt agreed to plan.     Labs  Review Labs Reviewed  CBC WITH DIFFERENTIAL/PLATELET - Abnormal; Notable for the following:    WBC 3.5 (*)    All other components within normal limits  BASIC METABOLIC PANEL - Abnormal; Notable for the following:    CO2 20 (*)    BUN 38 (*)    Creatinine, Ser 1.76 (*)    GFR calc non Af Amer 27 (*)    GFR calc Af Amer 31 (*)    All other components within normal limits  PROTIME-INR - Abnormal; Notable for the following:    Prothrombin Time 24.0 (*)    INR 2.17 (*)    All other components within normal limits  TROPONIN I  D-DIMER, QUANTITATIVE (NOT AT [facility])    Imaging Review Dg  Chest 2 View  01/10/2015  CLINICAL DATA:  Chest pain. Back pain. Left arm pain. Shortness of breath. EXAM: CHEST  2 VIEW COMPARISON:  08/11/2013 FINDINGS: Heart size and pulmonary vascularity are normal and the lungs are clear. Calcification of the thoracic aorta. No osseous abnormality. No effusions. IMPRESSION: No acute abnormality.  Aortic atherosclerosis. Electronically Signed   By: Lorriane Shire M.D.   On: 01/10/2015 12:13      EKG Interpretation   Date/Time:  Sunday January 10 2015 10:36:20 EDT Ventricular Rate:  72 PR Interval:  94 QRS Duration: 74 QT Interval:  382 QTC Calculation: 418 R Axis:   67 Text Interpretation:  Sinus rhythm Short PR interval Nonspecific T  abnormalities, lateral leads Since previous tracing PR is shortened  Confirmed by LINKER  MD, Lasonya Hubner ([id number]) on 01/10/2015 10:48:55 AM      MDM   Final diagnoses:  Upper back pain on left side  Pain of left upper extremity   Pt presenting with left upper back pain and left arm pain.  There is an area of tenderness inferior to scapula.  Due to hx of PE, this must be considered.  Pt does have therapeutic INR.  No tachypnea or hypoxia.    I, LINKER,Dorice Stiggers K, personally performed the services described in this documentation. All medical record entries made by the scribe were at my direction and in my presence.  I have  reviewed the chart and discharge instructions and agree that the record reflects my personal performance and is accurate and complete. LINKER,Marqus Macphee K.  01/10/2015. 2:19 PM.     11 :28 AM pt is not eligible for CT angio chest due to her renal function.  Will await CXR to look for other causes of her symptoms, obtain INR and d-dimer, but will likely need VQ scan.    12:24 PM  D/w Dr. Thornton Papas radiology, he agrees that with negative d-dimer and therapeutic INR pt does not need to have emergent imaging for PE today.  He suggests that patient have VQ scan ordered for tomorrow- and close followup with her PMD  2:19 PM pt was scheduled for VQ scan tomorrow at noon.    Alfonzo Beers, MD 01/10/15 [phone]

## 2015-01-10 NOTE — ED Notes (Signed)
VQ scan to be scheduled and then patient can be discharged.

## 2015-01-11 ENCOUNTER — Ambulatory Visit (HOSPITAL_COMMUNITY)
Admit: 2015-01-11 | Discharge: 2015-01-11 | Disposition: A | Discharge status: Home / Self Care | Payer: Medicare Other | Source: Ambulatory Visit | Attending: Emergency Medicine | Admitting: Emergency Medicine

## 2015-01-11 ENCOUNTER — Encounter (HOSPITAL_COMMUNITY): Payer: Self-pay

## 2015-01-11 DIAGNOSIS — Z86711 Personal history of pulmonary embolism: Secondary | ICD-10-CM | POA: Insufficient documentation

## 2015-01-11 DIAGNOSIS — R0602 Shortness of breath: Secondary | ICD-10-CM | POA: Diagnosis not present

## 2015-01-11 DIAGNOSIS — R079 Chest pain, unspecified: Secondary | ICD-10-CM | POA: Diagnosis not present

## 2015-01-11 HISTORY — DX: Disorder of kidney and ureter, unspecified: N28.9

## 2015-01-11 MED ORDER — TECHNETIUM TC 99M DIETHYLENETRIAME-PENTAACETIC ACID
30.0000 | Freq: Once | INTRAVENOUS | Status: DC | PRN
  Administered 2015-01-11: 30 via RESPIRATORY_TRACT
  Filled 2015-01-11: qty 30

## 2015-01-11 MED ORDER — TECHNETIUM TO 99M ALBUMIN AGGREGATED
4.0000 | Freq: Once | INTRAVENOUS | Status: AC | PRN
  Administered 2015-01-11: 4.2 via INTRAVENOUS

## 2015-01-12 DIAGNOSIS — I482 Chronic atrial fibrillation: Secondary | ICD-10-CM | POA: Diagnosis not present

## 2015-01-12 DIAGNOSIS — M542 Cervicalgia: Secondary | ICD-10-CM | POA: Diagnosis not present

## 2015-01-12 DIAGNOSIS — M79602 Pain in left arm: Secondary | ICD-10-CM | POA: Diagnosis not present

## 2015-01-27 DIAGNOSIS — Z86718 Personal history of other venous thrombosis and embolism: Secondary | ICD-10-CM | POA: Diagnosis not present

## 2015-01-27 DIAGNOSIS — Z7901 Long term (current) use of anticoagulants: Secondary | ICD-10-CM | POA: Diagnosis not present

## 2015-01-27 DIAGNOSIS — Z23 Encounter for immunization: Secondary | ICD-10-CM | POA: Diagnosis not present

## 2015-01-27 DIAGNOSIS — I482 Chronic atrial fibrillation: Secondary | ICD-10-CM | POA: Diagnosis not present

## 2015-01-29 ENCOUNTER — Other Ambulatory Visit (HOSPITAL_COMMUNITY): Payer: Self-pay | Admitting: Internal Medicine

## 2015-01-29 DIAGNOSIS — M542 Cervicalgia: Secondary | ICD-10-CM

## 2015-02-09 ENCOUNTER — Ambulatory Visit (HOSPITAL_COMMUNITY)
Admission: RE | Admit: 2015-02-09 | Discharge: 2015-02-09 | Disposition: A | Discharge status: Home / Self Care | Payer: Medicare Other | Source: Ambulatory Visit | Attending: Internal Medicine | Admitting: Internal Medicine

## 2015-02-09 DIAGNOSIS — M542 Cervicalgia: Secondary | ICD-10-CM | POA: Diagnosis not present

## 2015-03-04 DIAGNOSIS — Z7901 Long term (current) use of anticoagulants: Secondary | ICD-10-CM | POA: Diagnosis not present

## 2015-03-04 DIAGNOSIS — Z86718 Personal history of other venous thrombosis and embolism: Secondary | ICD-10-CM | POA: Diagnosis not present

## 2015-04-07 DIAGNOSIS — Z86718 Personal history of other venous thrombosis and embolism: Secondary | ICD-10-CM | POA: Diagnosis not present

## 2015-04-07 DIAGNOSIS — Z7901 Long term (current) use of anticoagulants: Secondary | ICD-10-CM | POA: Diagnosis not present

## 2015-05-19 DIAGNOSIS — Z7901 Long term (current) use of anticoagulants: Secondary | ICD-10-CM | POA: Diagnosis not present

## 2015-05-19 DIAGNOSIS — Z86718 Personal history of other venous thrombosis and embolism: Secondary | ICD-10-CM | POA: Diagnosis not present

## 2015-06-16 DIAGNOSIS — Z7901 Long term (current) use of anticoagulants: Secondary | ICD-10-CM | POA: Diagnosis not present

## 2015-06-16 DIAGNOSIS — Z86718 Personal history of other venous thrombosis and embolism: Secondary | ICD-10-CM | POA: Diagnosis not present

## 2015-06-21 DIAGNOSIS — E782 Mixed hyperlipidemia: Secondary | ICD-10-CM | POA: Diagnosis not present

## 2015-06-21 DIAGNOSIS — I10 Essential (primary) hypertension: Secondary | ICD-10-CM | POA: Diagnosis not present

## 2015-06-28 DIAGNOSIS — N183 Chronic kidney disease, stage 3 (moderate): Secondary | ICD-10-CM | POA: Diagnosis not present

## 2015-06-28 DIAGNOSIS — M25552 Pain in left hip: Secondary | ICD-10-CM | POA: Diagnosis not present

## 2015-07-15 DIAGNOSIS — Z86718 Personal history of other venous thrombosis and embolism: Secondary | ICD-10-CM | POA: Diagnosis not present

## 2015-07-15 DIAGNOSIS — Z7901 Long term (current) use of anticoagulants: Secondary | ICD-10-CM | POA: Diagnosis not present

## 2015-08-11 DIAGNOSIS — Z7901 Long term (current) use of anticoagulants: Secondary | ICD-10-CM | POA: Diagnosis not present

## 2015-08-11 DIAGNOSIS — Z86718 Personal history of other venous thrombosis and embolism: Secondary | ICD-10-CM | POA: Diagnosis not present

## 2015-09-16 DIAGNOSIS — Z86718 Personal history of other venous thrombosis and embolism: Secondary | ICD-10-CM | POA: Diagnosis not present

## 2015-09-16 DIAGNOSIS — Z7901 Long term (current) use of anticoagulants: Secondary | ICD-10-CM | POA: Diagnosis not present

## 2015-10-14 DIAGNOSIS — Z7901 Long term (current) use of anticoagulants: Secondary | ICD-10-CM | POA: Diagnosis not present

## 2015-10-14 DIAGNOSIS — Z86718 Personal history of other venous thrombosis and embolism: Secondary | ICD-10-CM | POA: Diagnosis not present

## 2015-11-11 DIAGNOSIS — Z86718 Personal history of other venous thrombosis and embolism: Secondary | ICD-10-CM | POA: Diagnosis not present

## 2015-11-11 DIAGNOSIS — Z7901 Long term (current) use of anticoagulants: Secondary | ICD-10-CM | POA: Diagnosis not present

## 2015-11-16 ENCOUNTER — Other Ambulatory Visit: Payer: Self-pay

## 2015-12-08 DIAGNOSIS — Z7901 Long term (current) use of anticoagulants: Secondary | ICD-10-CM | POA: Diagnosis not present

## 2015-12-08 DIAGNOSIS — Z86718 Personal history of other venous thrombosis and embolism: Secondary | ICD-10-CM | POA: Diagnosis not present

## 2015-12-24 DIAGNOSIS — E782 Mixed hyperlipidemia: Secondary | ICD-10-CM | POA: Diagnosis not present

## 2015-12-24 DIAGNOSIS — D518 Other vitamin B12 deficiency anemias: Secondary | ICD-10-CM | POA: Diagnosis not present

## 2015-12-24 DIAGNOSIS — E119 Type 2 diabetes mellitus without complications: Secondary | ICD-10-CM | POA: Diagnosis not present

## 2015-12-24 DIAGNOSIS — I482 Chronic atrial fibrillation: Secondary | ICD-10-CM | POA: Diagnosis not present

## 2015-12-24 DIAGNOSIS — E039 Hypothyroidism, unspecified: Secondary | ICD-10-CM | POA: Diagnosis not present

## 2015-12-24 DIAGNOSIS — K700 Alcoholic fatty liver: Secondary | ICD-10-CM | POA: Diagnosis not present

## 2015-12-24 DIAGNOSIS — I10 Essential (primary) hypertension: Secondary | ICD-10-CM | POA: Diagnosis not present

## 2015-12-24 DIAGNOSIS — D509 Iron deficiency anemia, unspecified: Secondary | ICD-10-CM | POA: Diagnosis not present

## 2015-12-29 DIAGNOSIS — F5101 Primary insomnia: Secondary | ICD-10-CM | POA: Diagnosis not present

## 2015-12-29 DIAGNOSIS — Z23 Encounter for immunization: Secondary | ICD-10-CM | POA: Diagnosis not present

## 2015-12-29 DIAGNOSIS — C649 Malignant neoplasm of unspecified kidney, except renal pelvis: Secondary | ICD-10-CM | POA: Diagnosis not present

## 2015-12-29 DIAGNOSIS — N183 Chronic kidney disease, stage 3 (moderate): Secondary | ICD-10-CM | POA: Diagnosis not present

## 2015-12-29 DIAGNOSIS — Z86718 Personal history of other venous thrombosis and embolism: Secondary | ICD-10-CM | POA: Diagnosis not present

## 2015-12-29 DIAGNOSIS — Z0000 Encounter for general adult medical examination without abnormal findings: Secondary | ICD-10-CM | POA: Diagnosis not present

## 2016-01-06 DIAGNOSIS — Z86718 Personal history of other venous thrombosis and embolism: Secondary | ICD-10-CM | POA: Diagnosis not present

## 2016-01-06 DIAGNOSIS — Z7901 Long term (current) use of anticoagulants: Secondary | ICD-10-CM | POA: Diagnosis not present

## 2016-01-26 DIAGNOSIS — Z86718 Personal history of other venous thrombosis and embolism: Secondary | ICD-10-CM | POA: Diagnosis not present

## 2016-01-26 DIAGNOSIS — Z7901 Long term (current) use of anticoagulants: Secondary | ICD-10-CM | POA: Diagnosis not present

## 2016-02-03 ENCOUNTER — Other Ambulatory Visit (HOSPITAL_COMMUNITY): Payer: Self-pay | Admitting: Respiratory Therapy

## 2016-02-03 DIAGNOSIS — R0602 Shortness of breath: Secondary | ICD-10-CM

## 2016-02-09 DIAGNOSIS — Z86718 Personal history of other venous thrombosis and embolism: Secondary | ICD-10-CM | POA: Diagnosis not present

## 2016-02-09 DIAGNOSIS — Z7901 Long term (current) use of anticoagulants: Secondary | ICD-10-CM | POA: Diagnosis not present

## 2016-02-25 ENCOUNTER — Ambulatory Visit (HOSPITAL_COMMUNITY)
Admission: RE | Admit: 2016-02-25 | Discharge: 2016-02-25 | Disposition: A | Discharge status: Home / Self Care | Payer: Medicare Other | Source: Ambulatory Visit | Attending: Internal Medicine | Admitting: Internal Medicine

## 2016-02-25 DIAGNOSIS — R0602 Shortness of breath: Secondary | ICD-10-CM | POA: Diagnosis not present

## 2016-02-25 DIAGNOSIS — Z87891 Personal history of nicotine dependence: Secondary | ICD-10-CM | POA: Insufficient documentation

## 2016-02-25 DIAGNOSIS — Z7901 Long term (current) use of anticoagulants: Secondary | ICD-10-CM | POA: Diagnosis not present

## 2016-02-25 DIAGNOSIS — R942 Abnormal results of pulmonary function studies: Secondary | ICD-10-CM | POA: Diagnosis not present

## 2016-02-25 DIAGNOSIS — Z86718 Personal history of other venous thrombosis and embolism: Secondary | ICD-10-CM | POA: Diagnosis not present

## 2016-02-25 LAB — PULMONARY FUNCTION TEST
DL/VA % PRED: 34 %
DL/VA: 1.76 ml/min/mmHg/L
DLCO UNC % PRED: 27 %
DLCO UNC: 7.29 ml/min/mmHg
FEF 25-75 POST: 0.5 L/s
FEF 25-75 Pre: 0.55 L/sec
FEF2575-%Change-Post: -8 %
FEF2575-%Pred-Post: 32 %
FEF2575-%Pred-Pre: 35 %
FEV1-%Change-Post: 0 %
FEV1-%Pred-Post: 69 %
FEV1-%Pred-Pre: 70 %
FEV1-Post: 1.25 L
FEV1-Pre: 1.26 L
FEV1FVC-%Change-Post: 0 %
FEV1FVC-%Pred-Pre: 72 %
FEV6-%Change-Post: 0 %
FEV6-%Pred-Post: 96 %
FEV6-%Pred-Pre: 96 %
FEV6-Post: 2.15 L
FEV6-Pre: 2.15 L
FEV6FVC-%CHANGE-POST: 1 %
FEV6FVC-%Pred-Post: 98 %
FEV6FVC-%Pred-Pre: 97 %
FVC-%Change-Post: -1 %
FVC-%PRED-PRE: 98 %
FVC-%Pred-Post: 97 %
FVC-POST: 2.27 L
FVC-PRE: 2.3 L
Post FEV1/FVC ratio: 55 %
Post FEV6/FVC ratio: 95 %
Pre FEV1/FVC ratio: 55 %
Pre FEV6/FVC Ratio: 93 %
RV % pred: 182 %
RV: 4.47 L
TLC % PRED: 127 %
TLC: 6.84 L

## 2016-02-25 MED ORDER — ALBUTEROL SULFATE (2.5 MG/3ML) 0.083% IN NEBU
2.5000 mg | INHALATION_SOLUTION | Freq: Once | RESPIRATORY_TRACT | Status: AC
  Administered 2016-02-25: 2.5 mg via RESPIRATORY_TRACT

## 2016-03-08 DIAGNOSIS — Z86718 Personal history of other venous thrombosis and embolism: Secondary | ICD-10-CM | POA: Diagnosis not present

## 2016-03-08 DIAGNOSIS — Z7901 Long term (current) use of anticoagulants: Secondary | ICD-10-CM | POA: Diagnosis not present

## 2016-04-06 DIAGNOSIS — Z86718 Personal history of other venous thrombosis and embolism: Secondary | ICD-10-CM | POA: Diagnosis not present

## 2016-04-06 DIAGNOSIS — Z7901 Long term (current) use of anticoagulants: Secondary | ICD-10-CM | POA: Diagnosis not present

## 2016-04-10 DIAGNOSIS — R338 Other retention of urine: Secondary | ICD-10-CM | POA: Diagnosis not present

## 2016-05-04 DIAGNOSIS — Z7901 Long term (current) use of anticoagulants: Secondary | ICD-10-CM | POA: Diagnosis not present

## 2016-05-04 DIAGNOSIS — Z86718 Personal history of other venous thrombosis and embolism: Secondary | ICD-10-CM | POA: Diagnosis not present

## 2016-06-09 DIAGNOSIS — I10 Essential (primary) hypertension: Secondary | ICD-10-CM | POA: Diagnosis not present

## 2016-06-09 DIAGNOSIS — D509 Iron deficiency anemia, unspecified: Secondary | ICD-10-CM | POA: Diagnosis not present

## 2016-06-13 DIAGNOSIS — N183 Chronic kidney disease, stage 3 (moderate): Secondary | ICD-10-CM | POA: Diagnosis not present

## 2016-06-13 DIAGNOSIS — Z6829 Body mass index (BMI) 29.0-29.9, adult: Secondary | ICD-10-CM | POA: Diagnosis not present

## 2016-06-13 DIAGNOSIS — R69 Illness, unspecified: Secondary | ICD-10-CM | POA: Diagnosis not present

## 2016-06-13 DIAGNOSIS — Z7901 Long term (current) use of anticoagulants: Secondary | ICD-10-CM | POA: Diagnosis not present

## 2016-06-13 DIAGNOSIS — M25511 Pain in right shoulder: Secondary | ICD-10-CM | POA: Diagnosis not present

## 2016-06-13 DIAGNOSIS — I10 Essential (primary) hypertension: Secondary | ICD-10-CM | POA: Diagnosis not present

## 2016-06-13 DIAGNOSIS — F5101 Primary insomnia: Secondary | ICD-10-CM | POA: Diagnosis not present

## 2016-06-13 DIAGNOSIS — Z85528 Personal history of other malignant neoplasm of kidney: Secondary | ICD-10-CM | POA: Diagnosis not present

## 2016-06-14 DIAGNOSIS — R338 Other retention of urine: Secondary | ICD-10-CM | POA: Diagnosis not present

## 2016-07-04 DIAGNOSIS — N183 Chronic kidney disease, stage 3 (moderate): Secondary | ICD-10-CM | POA: Diagnosis not present

## 2016-07-04 DIAGNOSIS — M25511 Pain in right shoulder: Secondary | ICD-10-CM | POA: Diagnosis not present

## 2016-07-04 DIAGNOSIS — I10 Essential (primary) hypertension: Secondary | ICD-10-CM | POA: Diagnosis not present

## 2016-07-04 DIAGNOSIS — Z6829 Body mass index (BMI) 29.0-29.9, adult: Secondary | ICD-10-CM | POA: Diagnosis not present

## 2016-07-04 DIAGNOSIS — Z86718 Personal history of other venous thrombosis and embolism: Secondary | ICD-10-CM | POA: Diagnosis not present

## 2016-07-09 ENCOUNTER — Emergency Department (HOSPITAL_COMMUNITY): Payer: Medicare HMO

## 2016-07-09 ENCOUNTER — Encounter (HOSPITAL_COMMUNITY): Payer: Self-pay | Admitting: Emergency Medicine

## 2016-07-09 ENCOUNTER — Observation Stay (HOSPITAL_COMMUNITY): Payer: Medicare HMO

## 2016-07-09 ENCOUNTER — Observation Stay (HOSPITAL_COMMUNITY)
Admission: EM | Admit: 2016-07-09 | Discharge: 2016-07-10 | Disposition: A | Discharge status: Home / Self Care | Payer: Medicare HMO | Attending: Family Medicine | Admitting: Family Medicine

## 2016-07-09 DIAGNOSIS — N183 Chronic kidney disease, stage 3 unspecified: Secondary | ICD-10-CM | POA: Diagnosis present

## 2016-07-09 DIAGNOSIS — D696 Thrombocytopenia, unspecified: Secondary | ICD-10-CM | POA: Diagnosis present

## 2016-07-09 DIAGNOSIS — I82409 Acute embolism and thrombosis of unspecified deep veins of unspecified lower extremity: Secondary | ICD-10-CM | POA: Diagnosis present

## 2016-07-09 DIAGNOSIS — Z79899 Other long term (current) drug therapy: Secondary | ICD-10-CM | POA: Insufficient documentation

## 2016-07-09 DIAGNOSIS — I10 Essential (primary) hypertension: Secondary | ICD-10-CM | POA: Insufficient documentation

## 2016-07-09 DIAGNOSIS — J449 Chronic obstructive pulmonary disease, unspecified: Secondary | ICD-10-CM | POA: Diagnosis not present

## 2016-07-09 DIAGNOSIS — I2699 Other pulmonary embolism without acute cor pulmonale: Secondary | ICD-10-CM | POA: Diagnosis present

## 2016-07-09 DIAGNOSIS — M7989 Other specified soft tissue disorders: Secondary | ICD-10-CM | POA: Insufficient documentation

## 2016-07-09 DIAGNOSIS — I82401 Acute embolism and thrombosis of unspecified deep veins of right lower extremity: Secondary | ICD-10-CM | POA: Diagnosis not present

## 2016-07-09 DIAGNOSIS — R0602 Shortness of breath: Secondary | ICD-10-CM | POA: Diagnosis not present

## 2016-07-09 DIAGNOSIS — R0600 Dyspnea, unspecified: Secondary | ICD-10-CM

## 2016-07-09 DIAGNOSIS — R0609 Other forms of dyspnea: Secondary | ICD-10-CM | POA: Diagnosis not present

## 2016-07-09 DIAGNOSIS — R739 Hyperglycemia, unspecified: Secondary | ICD-10-CM | POA: Diagnosis not present

## 2016-07-09 DIAGNOSIS — Z87891 Personal history of nicotine dependence: Secondary | ICD-10-CM | POA: Diagnosis not present

## 2016-07-09 HISTORY — DX: Synovial cyst of popliteal space (Baker), unspecified knee: M71.20

## 2016-07-09 HISTORY — DX: Chronic obstructive pulmonary disease, unspecified: J44.9

## 2016-07-09 LAB — CBC
HCT: 39.6 % (ref 36.0–46.0)
Hemoglobin: 12.7 g/dL (ref 12.0–15.0)
MCH: 30.3 pg (ref 26.0–34.0)
MCHC: 32.1 g/dL (ref 30.0–36.0)
MCV: 94.5 fL (ref 78.0–100.0)
Platelets: 115 10*3/uL — ABNORMAL LOW (ref 150–400)
RBC: 4.19 MIL/uL (ref 3.87–5.11)
RDW: 14 % (ref 11.5–15.5)
WBC: 5.3 10*3/uL (ref 4.0–10.5)

## 2016-07-09 LAB — PROTIME-INR
INR: 0.99
PROTHROMBIN TIME: 13.1 s (ref 11.4–15.2)

## 2016-07-09 LAB — COMPREHENSIVE METABOLIC PANEL
ALBUMIN: 3.7 g/dL (ref 3.5–5.0)
ALK PHOS: 86 U/L (ref 38–126)
ALT: 12 U/L — AB (ref 14–54)
ANION GAP: 6 (ref 5–15)
AST: 28 U/L (ref 15–41)
BUN: 28 mg/dL — ABNORMAL HIGH (ref 6–20)
CALCIUM: 9 mg/dL (ref 8.9–10.3)
CO2: 24 mmol/L (ref 22–32)
CREATININE: 1.69 mg/dL — AB (ref 0.44–1.00)
Chloride: 107 mmol/L (ref 101–111)
GFR calc Af Amer: 32 mL/min — ABNORMAL LOW (ref >60)
GFR calc non Af Amer: 28 mL/min — ABNORMAL LOW (ref >60)
GLUCOSE: 122 mg/dL — AB (ref 65–99)
Potassium: 4.9 mmol/L (ref 3.5–5.1)
SODIUM: 137 mmol/L (ref 135–145)
Total Bilirubin: 0.8 mg/dL (ref 0.3–1.2)
Total Protein: 7.3 g/dL (ref 6.5–8.1)

## 2016-07-09 LAB — BRAIN NATRIURETIC PEPTIDE: B Natriuretic Peptide: 29 pg/mL (ref 0.0–100.0)

## 2016-07-09 LAB — TROPONIN I: Troponin I: <0.03 ng/mL (ref <0.03)

## 2016-07-09 LAB — APTT: aPTT: 29 seconds (ref 24–36)

## 2016-07-09 MED ORDER — HEPARIN (PORCINE) IN NACL 100-0.45 UNIT/ML-% IJ SOLN
1150.0000 [IU]/h | INTRAMUSCULAR | Status: DC
  Administered 2016-07-09: 1200 [IU]/h via INTRAVENOUS
  Filled 2016-07-09: qty 250

## 2016-07-09 MED ORDER — ONDANSETRON HCL 4 MG/2ML IJ SOLN: 4.0000 mg | Freq: Four times a day (QID) | INTRAMUSCULAR | Status: DC | PRN

## 2016-07-09 MED ORDER — ACETAMINOPHEN 325 MG PO TABS: 650.0000 mg | ORAL_TABLET | Freq: Four times a day (QID) | ORAL | Status: DC | PRN

## 2016-07-09 MED ORDER — TECHNETIUM TO 99M ALBUMIN AGGREGATED
4.0000 | Freq: Once | INTRAVENOUS | Status: AC | PRN
  Administered 2016-07-09: 4 via INTRAVENOUS

## 2016-07-09 MED ORDER — ALBUTEROL SULFATE (2.5 MG/3ML) 0.083% IN NEBU
3.0000 mL | INHALATION_SOLUTION | RESPIRATORY_TRACT | Status: DC | PRN
  Administered 2016-07-10: 3 mL via RESPIRATORY_TRACT
  Filled 2016-07-09: qty 3

## 2016-07-09 MED ORDER — CYCLOBENZAPRINE HCL 10 MG PO TABS: 5.0000 mg | ORAL_TABLET | Freq: Three times a day (TID) | ORAL | Status: DC | PRN

## 2016-07-09 MED ORDER — HEPARIN BOLUS VIA INFUSION
5000.0000 [IU] | Freq: Once | INTRAVENOUS | Status: AC
  Administered 2016-07-09: 5000 [IU] via INTRAVENOUS

## 2016-07-09 MED ORDER — DOCUSATE SODIUM 100 MG PO CAPS
100.0000 mg | ORAL_CAPSULE | Freq: Two times a day (BID) | ORAL | Status: DC
  Administered 2016-07-09 – 2016-07-10 (×2): 100 mg via ORAL
  Filled 2016-07-09 (×2): qty 1

## 2016-07-09 MED ORDER — ONDANSETRON HCL 4 MG PO TABS: 4.0000 mg | ORAL_TABLET | Freq: Four times a day (QID) | ORAL | Status: DC | PRN

## 2016-07-09 MED ORDER — TECHNETIUM TC 99M DIETHYLENETRIAME-PENTAACETIC ACID
30.0000 | Freq: Once | INTRAVENOUS | Status: AC | PRN
  Administered 2016-07-09: 31 via RESPIRATORY_TRACT

## 2016-07-09 MED ORDER — LACTATED RINGERS IV SOLN
INTRAVENOUS | Status: DC
  Administered 2016-07-09: 22:00:00 via INTRAVENOUS

## 2016-07-09 MED ORDER — ACETAMINOPHEN 650 MG RE SUPP: 650.0000 mg | Freq: Four times a day (QID) | RECTAL | Status: DC | PRN

## 2016-07-09 NOTE — ED Provider Notes (Signed)
[facility] DEPT Provider Note   CSN: [id number] Arrival date & time: 07/09/16  1453     History   Chief Complaint Chief Complaint  Patient presents with  . Shortness of Breath    HPI Lisa Lucas is a 81 y.o. female.  HPI Patient with chronic shortness of breath on 3 L of home O2 as needed presents with worsening dyspnea on exertion for the past one day. Cough, fever or chills. Denies any chest pain. Has had several days of left lower extremity swelling. Has had previous history of PE and was taken off her Coumadin 3 weeks ago. Past Medical History:  Diagnosis Date  . Arthritis   . Schlossberg's cyst   . Cancer ([place])    kidney bil   . COPD (chronic obstructive pulmonary disease) (HCC)    3L intermittent home O2  . DVT (deep venous thrombosis) ([place]) 12/2010   right lower extremity and PE but also had prior DVT and had been on coumadin but converted to plavix/asa due to "reaction" until she presented with another DVT/PE in 12/2010.;  LLE DVT 02/2012  . History of kidney cancer   . Hypercholesteremia   . Hypertension   . Osteoporosis   . Peripheral vascular disease ([place])   . Pneumonia    recent  1 month ago  . PONV (postoperative nausea and vomiting)    has difficulty breathing coming out of anesthesia,  . Pulmonary embolism ([place]) 12/2010  . Renal insufficiency   . Spinal stenosis     Patient Active Problem List   Diagnosis Date Noted  . CKD (chronic kidney disease) stage 3, GFR 30-59 ml/min 07/09/2016  . Hyperglycemia 07/09/2016  . Herniated nucleus pulposus, L4-5 04/19/2012  . DVT of lower limb, acute ([place]) 02/21/2012  . History of back surgery 02/21/2012  . Thrombocytopenia ([place]) 02/21/2012  . Pulmonary emboli ([place]) 12/20/2011  . Chronic anticoagulation 12/20/2011  . Rectal bleeding 12/20/2011  . Family history of colon cancer 12/20/2011  . Pulmonary embolism ([place]) 01/09/2011  . Vomiting 01/09/2011  . Kidney carcinoma ([place]) 01/09/2011  . History of DVT  (deep vein thrombosis) 01/09/2011  . Hyperlipidemia 01/09/2011  . HTN (hypertension) 01/09/2011    Past Surgical History:  Procedure Laterality Date  . ABDOMINAL HYSTERECTOMY    . APPENDECTOMY    . BACK SURGERY    . COLONOSCOPY  12/29/2011   Procedure: COLONOSCOPY;  Surgeon: Danie Binder, MD;  Location: [facility];  Service: Endoscopy;  Laterality: N/A;  11:30  . HERNIA REPAIR    . KIDNEY SURGERY  2010   bilateral renal cancer s/p bilateral partial nephrectomy  . LEG SURGERY     right leg for blood clot  . LUMBAR LAMINECTOMY  04/16/2012   Dr Joya Salm  . LUMBAR LAMINECTOMY/DECOMPRESSION MICRODISCECTOMY  01/30/2012   Procedure: LUMBAR LAMINECTOMY/DECOMPRESSION MICRODISCECTOMY 1 LEVEL;  Surgeon: Floyce Stakes, MD;  Location: [facility];  Service: Neurosurgery;  Laterality: Left;  Left Lumbar Four-Five Diskectomy  . LUMBAR LAMINECTOMY/DECOMPRESSION MICRODISCECTOMY  04/16/2012   Procedure: LUMBAR LAMINECTOMY/DECOMPRESSION MICRODISCECTOMY 1 LEVEL;  Surgeon: Floyce Stakes, MD;  Location: [facility];  Service: Neurosurgery;  Laterality: Left;  Left Lumbar four-five Redo Diskectomy    OB History    No data available       Home Medications    Prior to Admission medications   Medication Sig Start Date End Date Taking? Authorizing Provider  acetaminophen (TYLENOL) 500 MG tablet Take 1,000 mg by mouth every 6 (six)  hours as needed for moderate pain.   Yes Historical Provider, MD  albuterol (PROVENTIL) (5 MG/ML) 0.5% nebulizer solution Take 0.5 mLs (2.5 mg total) by nebulization every 2 (two) hours as needed for wheezing or shortness of breath. 04/20/12  Yes Kristeen Miss, MD  amLODipine (NORVASC) 5 MG tablet Take 5 mg by mouth daily.   Yes Historical Provider, MD  Calcium Carb-Cholecalciferol (CALCIUM 600/VITAMIN D3) 600-800 MG-UNIT TABS Take 1 tablet by mouth every morning.    Yes Historical Provider, MD  cyclobenzaprine (FLEXERIL) 5 MG tablet Take 1 tablet (5 mg total) by mouth 3  (three) times daily as needed for muscle spasms. 01/10/15  Yes Alfonzo Beers, MD  apixaban (ELIQUIS) 5 MG TABS tablet Take 2 tablets (10 mg total) by mouth 2 (two) times daily. 07/10/16   Eber Jones, MD  apixaban (ELIQUIS) 5 MG TABS tablet Take 1 tablet (5 mg total) by mouth 2 (two) times daily. 07/17/16   Eber Jones, MD    Family History Family History  Problem Relation Age of Onset  . Colon cancer Daughter     age 22s  . Colon cancer Brother     age 6    Social History Social History  Substance Use Topics  . Smoking status: Former Smoker    Packs/day: 0.25    Years: 60.00    Types: Cigarettes    Quit date: 12/10/2010  . Smokeless tobacco: Never Used  . Alcohol use No     Comment: quit long time ago     Allergies   Hydrocodone; Morphine and related; and Oxycodone   Review of Systems Review of Systems  Constitutional: Negative for chills and fever.  Respiratory: Positive for shortness of breath. Negative for cough.   Cardiovascular: Positive for leg swelling. Negative for chest pain.  Gastrointestinal: Negative for abdominal pain, diarrhea, nausea and vomiting.  Musculoskeletal: Negative for back pain, myalgias and neck pain.  Skin: Negative for rash and wound.  Neurological: Negative for dizziness, weakness, light-headedness, numbness and headaches.  All other systems reviewed and are negative.    Physical Exam Updated Vital Signs BP (!) 128/56 (BP Location: Left Arm)   Pulse 81   Temp 98.4 F (36.9 C) (Oral)   Resp 16   Ht 5\' 5"  (1.651 m)   Wt 169 lb 12.4 oz (77 kg)   SpO2 94%   BMI 28.25 kg/m   Physical Exam  Constitutional: She is oriented to person, place, and time. She appears well-developed and well-nourished. No distress.  HENT:  Head: Normocephalic and atraumatic.  Mouth/Throat: Oropharynx is clear and moist.  Eyes: EOM are normal. Pupils are equal, round, and reactive to light.  Neck: Normal range of motion. Neck supple. No  JVD present.  Cardiovascular: Normal rate and regular rhythm.   Pulmonary/Chest: Effort normal. No respiratory distress. She has no wheezes. She has rales. She exhibits no tenderness.  Few scattered rhonchi in bilateral bases  Abdominal: Soft. Bowel sounds are normal. There is no tenderness. There is no rebound and no guarding.  Musculoskeletal: Normal range of motion. She exhibits tenderness. She exhibits no edema.  Left lower extremity swelling and asymmetry compared to right. Distal pulses intact. No calf tenderness.  Neurological: She is alert and oriented to person, place, and time.  Moving all extremities without focal deficit. Sensation fully intact.  Skin: Skin is warm and dry. Capillary refill takes less than 2 seconds. No rash noted. No erythema.  Psychiatric: She has a normal mood  and affect. Her behavior is normal.  Nursing note and vitals reviewed.    ED Treatments / Results  Labs (all labs ordered are listed, but only abnormal results are displayed) Labs Reviewed  CBC - Abnormal; Notable for the following:       Result Value   Platelets 115 (*)    All other components within normal limits  COMPREHENSIVE METABOLIC PANEL - Abnormal; Notable for the following:    Glucose, Bld 122 (*)    BUN 28 (*)    Creatinine, Ser 1.69 (*)    ALT 12 (*)    GFR calc non Af Amer 28 (*)    GFR calc Af Amer 32 (*)    All other components within normal limits  HEPARIN LEVEL (UNFRACTIONATED) - Abnormal; Notable for the following:    Heparin Unfractionated 0.74 (*)    All other components within normal limits  CBC - Abnormal; Notable for the following:    Hemoglobin 11.8 (*)    Platelets 121 (*)    All other components within normal limits  BASIC METABOLIC PANEL - Abnormal; Notable for the following:    Glucose, Bld 110 (*)    BUN 27 (*)    Creatinine, Ser 1.58 (*)    GFR calc non Af Amer 30 (*)    GFR calc Af Amer 35 (*)    All other components within normal limits  TROPONIN I    BRAIN NATRIURETIC PEPTIDE  PROTIME-INR  APTT  PROTIME-INR    EKG  EKG Interpretation  Date/Time:  Sunday July 09 2016 15:00:08 EDT Ventricular Rate:  89 PR Interval:    QRS Duration: 78 QT Interval:  349 QTC Calculation: 425 R Axis:   74 Text Interpretation:  Sinus rhythm Nonspecific T abnormalities, lateral leads Baseline wander in lead(s) V5 Confirmed by Lita Mains  MD, Carollyn Etcheverry ([id number]) on 07/09/2016 3:32:42 PM       Radiology US Venous Img Lower Unilateral Left  Result Date: 07/10/2016 CLINICAL DATA:  81 year old female with left lower extremity pain and swelling. History of a left lower extremity DVT in 2013. EXAM: LEFT LOWER EXTREMITY VENOUS DOPPLER ULTRASOUND TECHNIQUE: Gray-scale sonography with graded compression, as well as color Doppler and duplex ultrasound were performed to evaluate the lower extremity deep venous systems from the level of the common femoral vein and including the common femoral, femoral, profunda femoral, popliteal and calf veins including the posterior tibial, peroneal and gastrocnemius veins when visible. The superficial great saphenous vein was also interrogated. Spectral Doppler was utilized to evaluate flow at rest and with distal augmentation maneuvers in the common femoral, femoral and popliteal veins. COMPARISON:  Prior duplex venous ultrasound 02/22/2012 FINDINGS: Contralateral Common Femoral Vein: Respiratory phasicity is normal and symmetric with the symptomatic side. No evidence of thrombus. Normal compressibility. Common Femoral Vein: Proximal common femoral vein is patent. However, distal to the origin of the great saphenous vein, the common femoral vein is noncompressible and contains echogenic thrombus. Saphenofemoral Junction: No evidence of thrombus. Normal compressibility and flow on color Doppler imaging. Profunda Femoral Vein: Thrombus extends into the profunda femoral vein. Femoral Vein: Thrombus extends into the femoral vein in the thigh and  extends throughout the thigh into the popliteal vein. Popliteal Vein: Thrombus extends into the proximal popliteal vein. The more distal popliteal vein demonstrates color flow on Doppler imaging. Calf Veins: No evidence of thrombus. Normal compressibility and flow on color Doppler imaging. Superficial Great Saphenous Vein: No evidence of thrombus. Normal compressibility and flow on  color Doppler imaging. Venous Reflux:  None. Other Findings: The small saphenous vein is also thrombosed. Edema present within the superficial soft tissues of the lower leg. IMPRESSION: 1. Positive for acute, or acute on chronic deep venous thrombosis in the left lower extremity extending from the common femoral vein into the proximal popliteal vein. The more distal popliteal and calf veins remain patent. 2. Positive for superficial venous thrombosis involving the small saphenous vein in the posterior calf. These results will be called to the ordering clinician or representative by the Radiologist Assistant, and communication documented in the PACS or zVision Dashboard. Electronically Signed   By: Jacqulynn Cadet M.D.   On: 07/10/2016 09:09    Procedures Procedures (including critical care time)  Medications Ordered in ED Medications  heparin bolus via infusion 5,000 Units (5,000 Units Intravenous Bolus from Bag 07/09/16 1837)  technetium TC 58M diethylenetriame-pentaacetic acid (DTPA) injection 30 millicurie (31 millicuries Inhalation Given 07/09/16 2025)  technetium albumin aggregated (MAA) injection solution 4 millicurie (4 millicuries Intravenous Contrast Given 07/09/16 2045)     Initial Impression / Assessment and Plan / ED Course  I have reviewed the triage vital signs and the nursing notes.  Pertinent labs & imaging results that were available during my care of the patient were reviewed by me and considered in my medical decision making (see chart for details).     Admitted to hospitalist for likely DVT/PE.  Unable to get CT angiogram in the emergency Department due to renal insufficiency. Will start back on anticoagulation.  Final Clinical Impressions(s) / ED Diagnoses   Final diagnoses:  Dyspnea on exertion  Left leg swelling    New Prescriptions Discharge Medication List as of 07/10/2016  2:36 PM    START taking these medications   Details  !! apixaban (ELIQUIS) 5 MG TABS tablet Take 2 tablets (10 mg total) by mouth 2 (two) times daily., Starting Mon 07/10/2016, Normal    !! apixaban (ELIQUIS) 5 MG TABS tablet Take 1 tablet (5 mg total) by mouth 2 (two) times daily., Starting Mon 07/17/2016, Normal     !! - Potential duplicate medications found. Please discuss with provider.       Julianne Rice, MD 07/11/16 [phone]

## 2016-07-09 NOTE — ED Triage Notes (Addendum)
Pt family reports pt has history of blood clots. Pt reports shortness of breath, palpitations for last several days. Mild dyspnea noted with exertion. Pt family reports taken off coumadin x3 weeks ago after taking it "for years." pt also reports left leg pain/swelling. Pt family states pt is to wear home o2 as needed. nad noted.

## 2016-07-09 NOTE — ED Notes (Signed)
Per Internal Medicine patient may have a meal tray at this time. RN aware. Patient given a dinner tray.

## 2016-07-09 NOTE — ED Notes (Signed)
Pt ready and stable to transport to [facility].  Report given to Gershon Cull, RN.

## 2016-07-09 NOTE — Progress Notes (Signed)
ANTICOAGULATION CONSULT NOTE - Initial Consult  Pharmacy Consult for HEPARIN Indication: pulmonary embolus  Allergies  Allergen Reactions  . Hydrocodone Nausea Only  . Morphine And Related Nausea Only  . Oxycodone Nausea Only   Patient Measurements: Height: 5\' 5"  (165.1 cm) Weight: 170 lb (77.1 kg) IBW/kg (Calculated) : 57 HEPARIN DW (KG): 73  Vital Signs: Temp: 98.5 F (36.9 C) (04/22 1503) Temp Source: Oral (04/22 1503) BP: 106/78 (04/22 1700) Pulse Rate: 80 (04/22 1700)  Labs:  Recent Labs  07/09/16 1515 07/09/16 1524  HGB 12.7  --   HCT 39.6  --   PLT 115*  --   CREATININE 1.69*  --   TROPONINI  --  <0.03   Estimated Creatinine Clearance: 28.2 mL/min (A) (by C-G formula based on SCr of 1.69 mg/dL (H)).  Medical History: Past Medical History:  Diagnosis Date  . Arthritis   . Abrigo's cyst   . Cancer ([place])    kidney bil   . COPD (chronic obstructive pulmonary disease) (HCC)    3L intermittent home O2  . DVT (deep venous thrombosis) ([place]) 12/2010   right lower extremity and PE but also had prior DVT and had been on coumadin but converted to plavix/asa due to "reaction" until she presented with another DVT/PE in 12/2010.;  LLE DVT 02/2012  . History of kidney cancer   . Hypercholesteremia   . Hypertension   . Osteoporosis   . Peripheral vascular disease ([place])   . Pneumonia    recent  1 month ago  . PONV (postoperative nausea and vomiting)    has difficulty breathing coming out of anesthesia,  . Pulmonary embolism ([place]) 12/2010  . Renal insufficiency   . Spinal stenosis    Medications:   (Not in a hospital admission)  Assessment: 81yo female with h/o clots.  Per report, recently taken off coumadin.  Asked to initiate Heparin for suspected PE.  Goal of Therapy:  Heparin level 0.3-0.7 units/ml Monitor platelets by anticoagulation protocol: Yes   Plan:   Heparin 5000 units IV now x 1  Heparin infusion at 1200 units/hr  Heparin level in ~8 hrs  then daily  CBC daily while on Heparin  [place] Crane, Breniya Goertzen A 07/09/2016,5:52 PM

## 2016-07-09 NOTE — H&P (Signed)
History and Physical    Lisa Lucas:[id number] DOB: 15-Mar-1936 DOA: 07/09/2016  PCP: Wende Neighbors, MD Consultants:  Team of doctors at [facility] - urology, Tenny Craw; vascular, Nicola Girt; urology - Hemal Patient coming from: home - lives next door to her daughter and lives with son; Donald Prose: daughter, [phone]  Chief Complaint: SOB  HPI: Lisa Lucas is a 81 y.o. female with medical history significant of partial nephrectomy for renal cell carcinoma with resultant stage III to IV CKD; COPD on intermittent home O2; multiple VTE events in the past; PVD; HTN; and HLD presenting with leg swelling and SOB.  She reports a "leg problem" - swelling, pain on the left lower extremity.   Started with swelling on Friday and has worsened.  Today with tachycardia to 102 and SOB.  SOB was at rest and with exertion.  No chest pain.  Dr. Nevada Crane took her off Coumadin about 3 weeks ago.  Has h/o blood clots, multiple, on Coumadin since 2010.  Patient asked if it would be okay if she went off it - lots of problems with Coumadin, difficulty maintaining diet and problems with INR.  On home O2, 3L as needed but she is reluctant to use it.     ED Course:  Clinically apparent LLE DVT, unable to perform CTA due to CKD, VQ ordered and request for admission.  Review of Systems: As per HPI; otherwise review of systems reviewed and negative.   Ambulatory Status:  Ambulates without assistance, but her daughter reports that she needs a case, just doesn't use one  Past Medical History:  Diagnosis Date  . Arthritis   . Michalsky's cyst   . Cancer ([place])    kidney bil   . COPD (chronic obstructive pulmonary disease) (HCC)    3L intermittent home O2  . DVT (deep venous thrombosis) ([place]) 12/2010   right lower extremity and PE but also had prior DVT and had been on coumadin but converted to plavix/asa due to "reaction" until she presented with another DVT/PE in 12/2010.;  LLE DVT 02/2012  . History of kidney cancer   .  Hypercholesteremia   . Hypertension   . Osteoporosis   . Peripheral vascular disease ([place])   . Pneumonia    recent  1 month ago  . PONV (postoperative nausea and vomiting)    has difficulty breathing coming out of anesthesia,  . Pulmonary embolism ([place]) 12/2010  . Renal insufficiency   . Spinal stenosis     Past Surgical History:  Procedure Laterality Date  . ABDOMINAL HYSTERECTOMY    . APPENDECTOMY    . BACK SURGERY    . COLONOSCOPY  12/29/2011   Procedure: COLONOSCOPY;  Surgeon: Danie Binder, MD;  Location[facility];  Service: Endoscopy;  Laterality: N/A;  11:30  . HERNIA REPAIR    . KIDNEY SURGERY  2010   bilateral renal cancer s/p bilateral partial nephrectomy  . LEG SURGERY     right leg for blood clot  . LUMBAR LAMINECTOMY  04/16/2012   Dr Joya Salm  . LUMBAR LAMINECTOMY/DECOMPRESSION MICRODISCECTOMY  01/30/2012   Procedure: LUMBAR LAMINECTOMY/DECOMPRESSION MICRODISCECTOMY 1 LEVEL;  Surgeon: Floyce Stakes, MD;  Location: [place] [facility] OR[facility];  Service: Neurosurgery;  Laterality: Left;  Left Lumbar Four-Five Diskectomy  . LUMBAR LAMINECTOMY/DECOMPRESSION MICRODISCECTOMY  04/16/2012   Procedure: LUMBAR LAMINECTOMY/DECOMPRESSION MICRODISCECTOMY 1 LEVEL;  Surgeon: Floyce Stakes, MD;  Location: [place] [facility]UR[facility] ORS;  Service: Neurosurgery;  Laterality: Left;  Left Lumbar four-five Redo Diskectomy  Social History   Social History  . Marital status: Widowed    Spouse name: N/A  . Number of children: 3  . Years of education: N/A   Occupational History  . retired    Social History Main Topics  . Smoking status: Former Smoker    Packs/day: 0.25    Years: 60.00    Types: Cigarettes    Quit date: 12/10/2010  . Smokeless tobacco: Never Used  . Alcohol use No     Comment: quit long time ago  . Drug use: No  . Sexual activity: Not Currently    Birth control/ protection: Surgical   Other Topics Concern  . Not on file   Social History Narrative  . No narrative on file     Allergies  Allergen Reactions  . Hydrocodone Nausea Only  . Morphine And Related Nausea Only  . Oxycodone Nausea Only    Family History  Problem Relation Age of Onset  . Colon cancer Daughter     age 8s  . Colon cancer Brother     age 31    Prior to Admission medications   Medication Sig Start Date End Date Taking? Authorizing Provider  acetaminophen (TYLENOL) 500 MG tablet Take 1,000 mg by mouth every 6 (six) hours as needed for moderate pain.   Yes Historical Provider, MD  albuterol (PROVENTIL) (5 MG/ML) 0.5% nebulizer solution Take 0.5 mLs (2.5 mg total) by nebulization every 2 (two) hours as needed for wheezing or shortness of breath. 04/20/12  Yes Kristeen Miss, MD  amLODipine (NORVASC) 5 MG tablet Take 5 mg by mouth daily.   Yes Historical Provider, MD  Calcium Carb-Cholecalciferol (CALCIUM 600/VITAMIN D3) 600-800 MG-UNIT TABS Take 1 tablet by mouth every morning.    Yes Historical Provider, MD  cyclobenzaprine (FLEXERIL) 5 MG tablet Take 1 tablet (5 mg total) by mouth 3 (three) times daily as needed for muscle spasms. 01/10/15  Yes Alfonzo Beers, MD  traMADol (ULTRAM) 50 MG tablet Take 1 tablet (50 mg total) by mouth every 6 (six) hours as needed. Patient not taking: Reported on 07/09/2016 01/10/15   Alfonzo Beers, MD  warfarin (COUMADIN) 5 MG tablet Take 2.5-5 mg by mouth daily. *Takes one-half tablet (2.5mg ) on Tuesdays and Friday.  Takes whole tablet all other days. 12/15/11   Historical Provider, MD    Physical Exam: Vitals:   07/09/16 1630 07/09/16 1700 07/09/16 1730 07/09/16 1800  BP: (!) 120/103 106/78 (!) 115/96 131/64  Pulse: 79 80    Resp: 17 (!) 23 (!) 23 (!) 22  Temp:      TempSrc:      SpO2: 97% 99%    Weight:      Height:         General: Appears calm and comfortable and is NAD on [place] O2 Eyes:  PERRL, EOMI, normal lids, iris ENT:  grossly normal hearing, lips & tongue, mmm Neck:  no LAD, masses or thyromegaly Cardiovascular:  RRR, no m/r/g. No LE  edema on the right. Respiratory:  CTA bilaterally, no w/r/r. Normal respiratory effort. Abdomen:  soft, ntnd, NABS Skin:  no rash or induration seen on limited exam Musculoskeletal:  RLE is thin, normal appearing, 27 cm.  LLE with marked erythema, warmth, edema, 33 cm. Psychiatric:  grossly normal mood and affect, speech fluent and appropriate, AOx3 Neurologic:  CN 2-12 grossly intact, moves all extremities in coordinated fashion, sensation intact  Labs on Admission: I have personally reviewed following labs and imaging studies  CBC:  Recent Labs Lab 07/09/16 1515  WBC 5.3  HGB 12.7  HCT 39.6  MCV 94.5  PLT 629*   Basic Metabolic Panel:  Recent Labs Lab 07/09/16 1515  NA 137  K 4.9  CL 107  CO2 24  GLUCOSE 122*  BUN 28*  CREATININE 1.69*  CALCIUM 9.0   GFR: Estimated Creatinine Clearance: 28.2 mL/min (A) (by C-G formula based on SCr of 1.69 mg/dL (H)). Liver Function Tests:  Recent Labs Lab 07/09/16 1515  AST 28  ALT 12*  ALKPHOS 86  BILITOT 0.8  PROT 7.3  ALBUMIN 3.7   No results for input(s): LIPASE, AMYLASE in the last 168 hours. No results for input(s): AMMONIA in the last 168 hours. Coagulation Profile:  Recent Labs Lab 07/09/16 1515  INR 0.99   Cardiac Enzymes:  Recent Labs Lab 07/09/16 1524  TROPONINI <0.03   BNP (last 3 results) No results for input(s): PROBNP in the last 8760 hours. HbA1C: No results for input(s): HGBA1C in the last 72 hours. CBG: No results for input(s): GLUCAP in the last 168 hours. Lipid Profile: No results for input(s): CHOL, HDL, LDLCALC, TRIG, CHOLHDL, LDLDIRECT in the last 72 hours. Thyroid Function Tests: No results for input(s): TSH, T4TOTAL, FREET4, T3FREE, THYROIDAB in the last 72 hours. Anemia Panel: No results for input(s): VITAMINB12, FOLATE, FERRITIN, TIBC, IRON, RETICCTPCT in the last 72 hours. Urine analysis:    Component Value Date/Time   COLORURINE YELLOW 08/11/2013 2214   APPEARANCEUR HAZY  (A) 08/11/2013 2214   LABSPEC 1.015 08/11/2013 2214   PHURINE 8.0 08/11/2013 2214   GLUCOSEU NEGATIVE 08/11/2013 2214   HGBUR TRACE (A) 08/11/2013 2214   BILIRUBINUR NEGATIVE 08/11/2013 2214   KETONESUR NEGATIVE 08/11/2013 2214   PROTEINUR 30 (A) 08/11/2013 2214   UROBILINOGEN 0.2 08/11/2013 2214   NITRITE NEGATIVE 08/11/2013 2214   LEUKOCYTESUR SMALL (A) 08/11/2013 2214    Creatinine Clearance: Estimated Creatinine Clearance: 28.2 mL/min (A) (by C-G formula based on SCr of 1.69 mg/dL (H)).  Sepsis Labs: @LABRCNTIP (procalcitonin:4,lacticidven:4) )No results found for this or any previous visit (from the past 240 hour(s)).   Radiological Exams on Admission: Dg Chest 2 View  Result Date: 07/09/2016 CLINICAL DATA:  81 year old female with shortness of breath worsening over the past 24 hours. Left leg swelling since Friday. EXAM: CHEST  2 VIEW COMPARISON:  Chest x-ray 01/10/2015. FINDINGS: Elevation of the right hemidiaphragm. Lung volumes are normal. No consolidative airspace disease. No pleural effusions. No pneumothorax. No pulmonary nodule or mass noted. Pulmonary vasculature and the cardiomediastinal silhouette are within normal limits. Atherosclerosis in the thoracic aorta. IMPRESSION: 1. No radiographic evidence of acute cardiopulmonary disease. 2. Chronic elevation of the right hemidiaphragm redemonstrated. 3. Aortic atherosclerosis. Electronically Signed   By: Vinnie Langton M.D.   On: 07/09/2016 15:34    EKG: Independently reviewed.  NSR with rate 89; nonspecific ST changes with no evidence of acute ischemia  Assessment/Plan Principal Problem:   Pulmonary embolism (HCC) Active Problems:   HTN (hypertension)   DVT of lower limb, acute (HCC)   Thrombocytopenia (HCC)   CKD (chronic kidney disease) stage 3, GFR 30-59 ml/min   Hyperglycemia   LLE DVT/PE -Clinical diagnosis, confirmatory testing is pending -Patient presenting with acute onset of LLE edema, pain, warmth from  Friday night and worsening; accompanied by SOB over the last 1-2 days -h/o multiple clots in the past -Was on Coumadin until about 3 weeks ago -While this is fast to redevelop clot, this appears to  be the case -Will need lifelong anticoagulation -Confirmatory testing with VQ scan and LLE ultrasound requested (unable to do CTA due to CKD) -Will also order Echo to evaluate for heart strain, although she is hemodynamically stable and troponin is negative -Will observe on telemetry overnight -Start treatment with Heparin drip, pharmacy to dose -Will convert to Lovenox tomorrow and she will need at least 5 days of Lovenox and until INR is therapeutic (if Coumadin is restarted) -She likely needs Coumadin again - more reversible, more cost-effective, appropriate with renal dysfunction -However, Eliquis would also be an apparent option so long as CrCl is >15 -Will defer this decision to the day team and the patient/family -Anticipate d/c to home tomorrow assuming no further issues, once testing is complete and the plan for long-term anticoagulation is established -If the patient deteriorates and needs transfer overnight, family requests transfer to [facility] since her specialists are there  CKD -Creatinine 1.69, CrCl 32 (stable) -This appears to be stable since 2014 -She was taken off ACE-I about 3 years ago -She likely would benefit from referral to nephrology as well as consideration of whether resuming Lisinopril would be reasonable  HTN -Recently restarted on medication - this time Norvasc only a couple of weeks ago -Will hold for tonight since BP is a bit soft -Can restart if needed  Hyperglycemia -Glucose 122 -May be stress response -Will follow with fasting AM labs -It is unlikely that she will need acute or chronic treatment for this issue  Thrombocytopenia -Platelets 115 -This is chronic for her but is currently worse than since 2013 -Will need to monitor this with her  anticoagulation   DVT prophylaxis: Heparin drip Code Status:  Full - confirmed with patient/family Family Communication: Daughter present throughout evaluation  Disposition Plan:  Home once clinically improved Consults called: None  Admission status: It is my clinical opinion that referral for OBSERVATION is reasonable and necessary in this patient based on the above information provided. The aforementioned taken together are felt to place the patient at high risk for further clinical deterioration. However it is anticipated that the patient may be medically stable for discharge from the hospital within 24 to 48 hours.    Karmen Bongo MD [facility]  If 7PM-7AM, please contact night-coverage [email or web address] Password [facility]  07/09/2016, 6:33 PM

## 2016-07-09 NOTE — ED Notes (Signed)
Pt needs to be NPO after midnight.

## 2016-07-10 ENCOUNTER — Observation Stay (HOSPITAL_COMMUNITY): Payer: Medicare HMO

## 2016-07-10 DIAGNOSIS — I10 Essential (primary) hypertension: Secondary | ICD-10-CM

## 2016-07-10 DIAGNOSIS — N183 Chronic kidney disease, stage 3 (moderate): Secondary | ICD-10-CM

## 2016-07-10 DIAGNOSIS — M7989 Other specified soft tissue disorders: Secondary | ICD-10-CM

## 2016-07-10 DIAGNOSIS — I82401 Acute embolism and thrombosis of unspecified deep veins of right lower extremity: Secondary | ICD-10-CM | POA: Diagnosis not present

## 2016-07-10 DIAGNOSIS — R0609 Other forms of dyspnea: Secondary | ICD-10-CM

## 2016-07-10 DIAGNOSIS — D696 Thrombocytopenia, unspecified: Secondary | ICD-10-CM | POA: Diagnosis not present

## 2016-07-10 DIAGNOSIS — R0602 Shortness of breath: Secondary | ICD-10-CM | POA: Diagnosis not present

## 2016-07-10 DIAGNOSIS — I82812 Embolism and thrombosis of superficial veins of left lower extremities: Secondary | ICD-10-CM | POA: Diagnosis not present

## 2016-07-10 LAB — PROTIME-INR
INR: 1.14
Prothrombin Time: 14.7 seconds (ref 11.4–15.2)

## 2016-07-10 LAB — HEPARIN LEVEL (UNFRACTIONATED): Heparin Unfractionated: 0.74 IU/mL — ABNORMAL HIGH (ref 0.30–0.70)

## 2016-07-10 LAB — CBC
HEMATOCRIT: 37.4 % (ref 36.0–46.0)
Hemoglobin: 11.8 g/dL — ABNORMAL LOW (ref 12.0–15.0)
MCH: 29.9 pg (ref 26.0–34.0)
MCHC: 31.6 g/dL (ref 30.0–36.0)
MCV: 94.7 fL (ref 78.0–100.0)
Platelets: 121 10*3/uL — ABNORMAL LOW (ref 150–400)
RBC: 3.95 MIL/uL (ref 3.87–5.11)
RDW: 13.9 % (ref 11.5–15.5)
WBC: 5.1 10*3/uL (ref 4.0–10.5)

## 2016-07-10 LAB — BASIC METABOLIC PANEL
Anion gap: 10 (ref 5–15)
BUN: 27 mg/dL — AB (ref 6–20)
CALCIUM: 9 mg/dL (ref 8.9–10.3)
CHLORIDE: 106 mmol/L (ref 101–111)
CO2: 24 mmol/L (ref 22–32)
CREATININE: 1.58 mg/dL — AB (ref 0.44–1.00)
GFR calc non Af Amer: 30 mL/min — ABNORMAL LOW (ref >60)
GFR, EST AFRICAN AMERICAN: 35 mL/min — AB (ref >60)
GLUCOSE: 110 mg/dL — AB (ref 65–99)
Potassium: 4.8 mmol/L (ref 3.5–5.1)
Sodium: 140 mmol/L (ref 135–145)

## 2016-07-10 MED ORDER — APIXABAN 5 MG PO TABS
10.0000 mg | ORAL_TABLET | Freq: Two times a day (BID) | ORAL | Status: DC
  Administered 2016-07-10: 10 mg via ORAL
  Filled 2016-07-10: qty 2

## 2016-07-10 MED ORDER — APIXABAN 5 MG PO TABS: 10.0000 mg | ORAL_TABLET | Freq: Two times a day (BID) | ORAL | 0 refills | Status: DC

## 2016-07-10 MED ORDER — APIXABAN 5 MG PO TABS: 5.0000 mg | ORAL_TABLET | Freq: Two times a day (BID) | ORAL | Status: DC

## 2016-07-10 MED ORDER — APIXABAN 5 MG PO TABS: 5.0000 mg | ORAL_TABLET | Freq: Two times a day (BID) | ORAL | 0 refills | Status: DC

## 2016-07-10 NOTE — Discharge Summary (Signed)
Physician Discharge Summary  Lisa Lucas ATF:[id number] DOB: Oct 22, 1935 DOA: 07/09/2016  PCP: Wende Neighbors, MD  Admit date: 07/09/2016 Discharge date: 07/10/2016  Admitted From: Home  Disposition:  Home  Recommendations for Outpatient Follow-up:  1. Follow up with PCP in 1-2 weeks 2. Needs lifelong anticoagulation 3. Please obtain BMP/CBC in one week  Home Health: No  Equipment/Devices: Has home oxygen already set up   Discharge Condition: Stable   CODE STATUS: Full code Diet recommendation: Heart Healthy  Brief/Interim Summary: Lisa Lucas is a 81 y.o. female with medical history significant of partial nephrectomy for renal cell carcinoma with resultant stage III to IV CKD; COPD on intermittent home O2; multiple VTE events in the past; PVD; HTN; and HLD presenting with leg swelling and SOB.  She reports a "leg problem" - swelling, pain on the left lower extremity.   Started with swelling on Friday and has worsened.  Today with tachycardia to 102 and SOB.  SOB was at rest and with exertion.  No chest pain.  Dr. Nevada Crane took her off Coumadin about 3 weeks ago.  Has h/o blood clots, multiple, on Coumadin since 2010.  Patient asked if it would be okay if she went off it - lots of problems with Coumadin, difficulty maintaining diet and problems with INR.  On home O2, 3L as needed but she is reluctant to use it.   Patient underwent V/Q scan that showed Low probability for pulmonary embolism. Central deposition of the DTPA compatible with COPD. Extensive ventilation defects similar to the prior study with overall better perfusion compared to the ventilation.  Patient was found to have an acute/ acute on chronic DVT showing positive for acute, or acute on chronic deep venous thrombosis in the left lower extremity extending from the common femoral vein into the proximal popliteal vein.    Discharge Diagnoses:  Principal Problem:   Pulmonary embolism (HCC) Active Problems:   HTN  (hypertension)   DVT of lower limb, acute (HCC)   Thrombocytopenia (HCC)   CKD (chronic kidney disease) stage 3, GFR 30-59 ml/min   Hyperglycemia    Discharge Instructions  Discharge Instructions    Call MD for:  difficulty breathing, headache or visual disturbances    Complete by:  As directed    Call MD for:  extreme fatigue    Complete by:  As directed    Call MD for:  hives    Complete by:  As directed    Call MD for:  persistant dizziness or light-headedness    Complete by:  As directed    Call MD for:  persistant nausea and vomiting    Complete by:  As directed    Call MD for:  severe uncontrolled pain    Complete by:  As directed    Call MD for:  temperature >100.4    Complete by:  As directed    Diet - low sodium heart healthy    Complete by:  As directed    Discharge instructions    Complete by:  As directed    Take 10mg  of Eliquis BID for 7 days followed by 5mg  BID after that  Will need lifelong anticoagulation Follow up with PCP in 1-2 weeks   Increase activity slowly    Complete by:  As directed      Allergies as of 07/10/2016      Reactions   Hydrocodone Nausea Only   Morphine And Related Nausea Only   Oxycodone Nausea Only  Medication List    TAKE these medications   acetaminophen 500 MG tablet Commonly known as:  TYLENOL Take 1,000 mg by mouth every 6 (six) hours as needed for moderate pain.   albuterol (5 MG/ML) 0.5% nebulizer solution Commonly known as:  PROVENTIL Take 0.5 mLs (2.5 mg total) by nebulization every 2 (two) hours as needed for wheezing or shortness of breath.   amLODipine 5 MG tablet Commonly known as:  NORVASC Take 5 mg by mouth daily.   apixaban 5 MG Tabs tablet Commonly known as:  ELIQUIS Take 2 tablets (10 mg total) by mouth 2 (two) times daily.   apixaban 5 MG Tabs tablet Commonly known as:  ELIQUIS Take 1 tablet (5 mg total) by mouth 2 (two) times daily. Start taking on:  07/17/2016   CALCIUM 600/VITAMIN D3  600-800 MG-UNIT Tabs Generic drug:  Calcium Carb-Cholecalciferol Take 1 tablet by mouth every morning.   cyclobenzaprine 5 MG tablet Commonly known as:  FLEXERIL Take 1 tablet (5 mg total) by mouth 3 (three) times daily as needed for muscle spasms.       Allergies  Allergen Reactions  . Hydrocodone Nausea Only  . Morphine And Related Nausea Only  . Oxycodone Nausea Only    Consultations:  None   Procedures/Studies: Dg Chest 2 View  Result Date: 07/09/2016 CLINICAL DATA:  81 year old female with shortness of breath worsening over the past 24 hours. Left leg swelling since Friday. EXAM: CHEST  2 VIEW COMPARISON:  Chest x-ray 01/10/2015. FINDINGS: Elevation of the right hemidiaphragm. Lung volumes are normal. No consolidative airspace disease. No pleural effusions. No pneumothorax. No pulmonary nodule or mass noted. Pulmonary vasculature and the cardiomediastinal silhouette are within normal limits. Atherosclerosis in the thoracic aorta. IMPRESSION: 1. No radiographic evidence of acute cardiopulmonary disease. 2. Chronic elevation of the right hemidiaphragm redemonstrated. 3. Aortic atherosclerosis. Electronically Signed   By: Vinnie Langton M.D.   On: 07/09/2016 15:34   Nm Pulmonary Perf And Vent  Result Date: 07/09/2016 CLINICAL DATA:  81 year old female with shortness of breath.) EXAM: NUCLEAR MEDICINE VENTILATION - PERFUSION LUNG SCAN TECHNIQUE: Ventilation images were obtained in multiple projections using inhaled aerosol Tc-64m DTPA. Perfusion images were obtained in multiple projections after intravenous injection of Tc-60m MAA. RADIOPHARMACEUTICALS:  31 mCi Technetium-43m DTPA aerosol inhalation and 4 mCi Technetium-18m MAA IV COMPARISON:  Chest radiograph dated 07/09/2016 and V/Q scan dated 01/11/2015 FINDINGS: Ventilation: Patchy areas of ventilation defects throughout both lungs with enlarged area of ventilation defect at the right lung base as seen on the prior study. Areas  of ventilation defect noted in the apices bilaterally. There is central deposition of the radiotracer. Perfusion: There is elevation of the right hemidiaphragm with associated decreased perfusion at the right lung base similar to prior study. No new perfusion defect or mismatch large or segmental perfusion defect identified. Overall there is more uniform perfusion compared to the ventilation. IMPRESSION: 1. Low probability for pulmonary embolism. 2. Central deposition of the DTPA compatible with COPD. 3. Extensive ventilation defects similar to the prior study with overall better perfusion compared to the ventilation. Electronically Signed   By: Anner Crete M.D.   On: 07/09/2016 21:21   US Venous Img Lower Unilateral Left  Result Date: 07/10/2016 CLINICAL DATA:  81 year old female with left lower extremity pain and swelling. History of a left lower extremity DVT in 2013. EXAM: LEFT LOWER EXTREMITY VENOUS DOPPLER ULTRASOUND TECHNIQUE: Gray-scale sonography with graded compression, as well as color Doppler and duplex ultrasound  were performed to evaluate the lower extremity deep venous systems from the level of the common femoral vein and including the common femoral, femoral, profunda femoral, popliteal and calf veins including the posterior tibial, peroneal and gastrocnemius veins when visible. The superficial great saphenous vein was also interrogated. Spectral Doppler was utilized to evaluate flow at rest and with distal augmentation maneuvers in the common femoral, femoral and popliteal veins. COMPARISON:  Prior duplex venous ultrasound 02/22/2012 FINDINGS: Contralateral Common Femoral Vein: Respiratory phasicity is normal and symmetric with the symptomatic side. No evidence of thrombus. Normal compressibility. Common Femoral Vein: Proximal common femoral vein is patent. However, distal to the origin of the great saphenous vein, the common femoral vein is noncompressible and contains echogenic thrombus.  Saphenofemoral Junction: No evidence of thrombus. Normal compressibility and flow on color Doppler imaging. Profunda Femoral Vein: Thrombus extends into the profunda femoral vein. Femoral Vein: Thrombus extends into the femoral vein in the thigh and extends throughout the thigh into the popliteal vein. Popliteal Vein: Thrombus extends into the proximal popliteal vein. The more distal popliteal vein demonstrates color flow on Doppler imaging. Calf Veins: No evidence of thrombus. Normal compressibility and flow on color Doppler imaging. Superficial Great Saphenous Vein: No evidence of thrombus. Normal compressibility and flow on color Doppler imaging. Venous Reflux:  None. Other Findings: The small saphenous vein is also thrombosed. Edema present within the superficial soft tissues of the lower leg. IMPRESSION: 1. Positive for acute, or acute on chronic deep venous thrombosis in the left lower extremity extending from the common femoral vein into the proximal popliteal vein. The more distal popliteal and calf veins remain patent. 2. Positive for superficial venous thrombosis involving the small saphenous vein in the posterior calf. These results will be called to the ordering clinician or representative by the Radiologist Assistant, and communication documented in the PACS or zVision Dashboard. Electronically Signed   By: Jacqulynn Cadet M.D.   On: 07/10/2016 09:09       Subjective: Patient says she feels well.  Her daughter is bedside and reports that she would like to talk about how much Eliquis is.  Patient denies shortness of breath.  She denies any chest pain.    Discharge Exam: Vitals:   07/09/16 2134 07/10/16 0610  BP: 128/66 (!) 162/70  Pulse: 84 93  Resp: 20 20  Temp: 98 F (36.7 C) 98 F (36.7 C)   Vitals:   07/09/16 1800 07/09/16 2134 07/10/16 0610 07/10/16 1242  BP: 131/64 128/66 (!) 162/70   Pulse:  84 93   Resp: (!) 22 20 20    Temp:  98 F (36.7 C) 98 F (36.7 C)   TempSrc:   Oral Oral   SpO2:  97% 98% 94%  Weight:  77 kg (169 lb 12.4 oz)    Height:  5\' 5"  (1.651 m)      General: Pt is alert, awake, not in acute distress Cardiovascular: RRR, S1/S2 +, no rubs, no gallops Respiratory: CTA bilaterally, no wheezing, no rhonchi Abdominal: Soft, NT, ND, bowel sounds + Extremities: LLE with erythema, warmth, edema, slightly tender.  Able to move upper extremities bilaterally independently    The results of significant diagnostics from this hospitalization (including imaging, microbiology, ancillary and laboratory) are listed below for reference.     Microbiology: No results found for this or any previous visit (from the past 240 hour(s)).   Labs: BNP (last 3 results)  Recent Labs  07/09/16 1524  BNP 29.0  Basic Metabolic Panel:  Recent Labs Lab 07/09/16 1515 07/10/16 0405  NA 137 140  K 4.9 4.8  CL 107 106  CO2 24 24  GLUCOSE 122* 110*  BUN 28* 27*  CREATININE 1.69* 1.58*  CALCIUM 9.0 9.0   Liver Function Tests:  Recent Labs Lab 07/09/16 1515  AST 28  ALT 12*  ALKPHOS 86  BILITOT 0.8  PROT 7.3  ALBUMIN 3.7   No results for input(s): LIPASE, AMYLASE in the last 168 hours. No results for input(s): AMMONIA in the last 168 hours. CBC:  Recent Labs Lab 07/09/16 1515 07/10/16 0405  WBC 5.3 5.1  HGB 12.7 11.8*  HCT 39.6 37.4  MCV 94.5 94.7  PLT 115* 121*   Cardiac Enzymes:  Recent Labs Lab 07/09/16 1524  TROPONINI <0.03   BNP: Invalid input(s): POCBNP CBG: No results for input(s): GLUCAP in the last 168 hours. D-Dimer No results for input(s): DDIMER in the last 72 hours. Hgb A1c No results for input(s): HGBA1C in the last 72 hours. Lipid Profile No results for input(s): CHOL, HDL, LDLCALC, TRIG, CHOLHDL, LDLDIRECT in the last 72 hours. Thyroid function studies No results for input(s): TSH, T4TOTAL, T3FREE, THYROIDAB in the last 72 hours.  Invalid input(s): FREET3 Anemia work up No results for input(s):  VITAMINB12, FOLATE, FERRITIN, TIBC, IRON, RETICCTPCT in the last 72 hours. Urinalysis    Component Value Date/Time   COLORURINE YELLOW 08/11/2013 2214   APPEARANCEUR HAZY (A) 08/11/2013 2214   LABSPEC 1.015 08/11/2013 2214   PHURINE 8.0 08/11/2013 2214   GLUCOSEU NEGATIVE 08/11/2013 2214   HGBUR TRACE (A) 08/11/2013 2214   BILIRUBINUR NEGATIVE 08/11/2013 2214   KETONESUR NEGATIVE 08/11/2013 2214   PROTEINUR 30 (A) 08/11/2013 2214   UROBILINOGEN 0.2 08/11/2013 2214   NITRITE NEGATIVE 08/11/2013 2214   LEUKOCYTESUR SMALL (A) 08/11/2013 2214   Sepsis Labs Invalid input(s): PROCALCITONIN,  WBC,  LACTICIDVEN Microbiology No results found for this or any previous visit (from the past 240 hour(s)).   Time coordinating discharge: Over 30 minutes  SIGNED:   Loretha Stapler, MD  [facility] 07/10/2016, 2:41 PM Pager [phone] If 7PM-7AM, please contact night-coverage [email or web address] Password [email or web address]

## 2016-07-10 NOTE — Progress Notes (Signed)
[place] for HEPARIN Indication: pulmonary embolus  Allergies  Allergen Reactions  . Hydrocodone Nausea Only  . Morphine And Related Nausea Only  . Oxycodone Nausea Only   Patient Measurements: Height: 5\' 5"  (165.1 cm) Weight: 169 lb 12.4 oz (77 kg) IBW/kg (Calculated) : 57 HEPARIN DW (KG): 73  Vital Signs: Temp: 98 F (36.7 C) (04/23 0610) Temp Source: Oral (04/23 0610) BP: 162/70 (04/23 0610) Pulse Rate: 93 (04/23 0610)  Labs:  Recent Labs  07/09/16 1515 07/09/16 1524 07/10/16 0405  HGB 12.7  --  11.8*  HCT 39.6  --  37.4  PLT 115*  --  121*  APTT 29  --   --   LABPROT 13.1  --  14.7  INR 0.99  --  1.14  HEPARINUNFRC  --   --  0.74*  CREATININE 1.69*  --  1.58*  TROPONINI  --  <0.03  --    Estimated Creatinine Clearance: 30.1 mL/min (A) (by C-G formula based on SCr of 1.58 mg/dL (H)).  Medical History: Past Medical History:  Diagnosis Date  . Arthritis   . Ruffolo's cyst   . Cancer ([place])    kidney bil   . COPD (chronic obstructive pulmonary disease) (HCC)    3L intermittent home O2  . DVT (deep venous thrombosis) ([place]) 12/2010   right lower extremity and PE but also had prior DVT and had been on coumadin but converted to plavix/asa due to "reaction" until she presented with another DVT/PE in 12/2010.;  LLE DVT 02/2012  . History of kidney cancer   . Hypercholesteremia   . Hypertension   . Osteoporosis   . Peripheral vascular disease ([place])   . Pneumonia    recent  1 month ago  . PONV (postoperative nausea and vomiting)    has difficulty breathing coming out of anesthesia,  . Pulmonary embolism ([place]) 12/2010  . Renal insufficiency   . Spinal stenosis    Medications:  Prescriptions Prior to Admission  Medication Sig Dispense Refill Last Dose  . acetaminophen (TYLENOL) 500 MG tablet Take 1,000 mg by mouth every 6 (six) hours as needed for moderate pain.   01/10/2015 at Unknown time  . albuterol (PROVENTIL) (5  MG/ML) 0.5% nebulizer solution Take 0.5 mLs (2.5 mg total) by nebulization every 2 (two) hours as needed for wheezing or shortness of breath. 20 mL 0 unknown  . amLODipine (NORVASC) 5 MG tablet Take 5 mg by mouth daily.   07/09/2016 at Unknown time  . Calcium Carb-Cholecalciferol (CALCIUM 600/VITAMIN D3) 600-800 MG-UNIT TABS Take 1 tablet by mouth every morning.    07/09/2016 at Unknown time  . cyclobenzaprine (FLEXERIL) 5 MG tablet Take 1 tablet (5 mg total) by mouth 3 (three) times daily as needed for muscle spasms. 20 tablet 0    Assessment: 81yo female with h/o clots.  Per report, recently taken off coumadin.  Heparin for suspected PE.  Heparin Level this AM slightly above goal.  Thrombocytopenia note, PLTC a little better this AM.  Goal of Therapy:  Heparin level 0.3-0.7 units/ml Monitor platelets by anticoagulation protocol: Yes   Plan:   Reduce Heparin infusion at 1150 units/hr  Heparin level in ~8 hrs then daily  CBC daily while on Heparin  Pricilla Larsson 07/10/2016,8:11 AM

## 2016-07-10 NOTE — Care Management Obs Status (Signed)
[facility] NOTIFICATION   Patient Details  Name: Lisa Lucas MRN: [id number] Date of Birth: 08/14/1935   Medicare Observation Status Notification Given:  Other (see comment) (pt discharged <24hrs)    Sherald Barge, RN 07/10/2016, 2:33 PM

## 2016-07-10 NOTE — Discharge Instructions (Signed)
Information on my medicine - ELIQUIS (apixaban)  This medication education was reviewed with me or my healthcare representative as part of my discharge preparation.  The pharmacist that spoke with me during my hospital stay was:  Pricilla Larsson, [facility]  Why was Eliquis prescribed for you? Eliquis was prescribed to treat blood clots that may have been found in the veins of your legs (deep vein thrombosis) or in your lungs (pulmonary embolism) and to reduce the risk of them occurring again.  What do You need to know about Eliquis ? The starting dose is 10 mg (two 5 mg tablets) taken TWICE daily for the FIRST SEVEN (7) DAYS, then on (enter date)  4/30  the dose is reduced to ONE 5 mg tablet taken TWICE daily.  Eliquis may be taken with or without food.   Try to take the dose about the same time in the morning and in the evening. If you have difficulty swallowing the tablet whole please discuss with your pharmacist how to take the medication safely.  Take Eliquis exactly as prescribed and DO NOT stop taking Eliquis without talking to the doctor who prescribed the medication.  Stopping may increase your risk of developing a new blood clot.  Refill your prescription before you run out.  After discharge, you should have regular check-up appointments with your healthcare provider that is prescribing your Eliquis.    What do you do if you miss a dose? If a dose of ELIQUIS is not taken at the scheduled time, take it as soon as possible on the same day and twice-daily administration should be resumed. The dose should not be doubled to make up for a missed dose.  Important Safety Information A possible side effect of Eliquis is bleeding. You should call your healthcare provider right away if you experience any of the following: ? Bleeding from an injury or your nose that does not stop. ? Unusual colored urine (red or dark brown) or unusual colored stools (red or black). ? Unusual bruising for  unknown reasons. ? A serious fall or if you hit your head (even if there is no bleeding).  Some medicines may interact with Eliquis and might increase your risk of bleeding or clotting while on Eliquis. To help avoid this, consult your healthcare provider or pharmacist prior to using any new prescription or non-prescription medications, including herbals, vitamins, non-steroidal anti-inflammatory drugs (NSAIDs) and supplements.  This website has more information on Eliquis (apixaban): http://[email or web address].com/eliquis/home

## 2016-07-10 NOTE — Care Management Note (Signed)
Case Management Note  Patient Details  Name: Lisa Lucas MRN: [id number] Date of Birth: [birth year]  Subjective/Objective:                  Pt admitted with DVT. She is from home, she is ind with ALD's. She has been on coumadin in the past for DVT's and was recently taken off of it. Pt will DC on Eliquis. Pt's daughter at bedside and has many questions. Pt given 30-day voucher for eliquis, benefits check results placed below. Pt has PCP, transportation to appointments and insurance with drug coverage. No HH or DME needed. No other needs communicated.   Action/Plan: Pt discharging home today with self care. Pharmacy consulted to speak with daughter about Eliquis.   Expected Discharge Date:  07/10/16               Expected Discharge Plan:  Home/Self Care  In-House Referral:  NA  Discharge planning Services  CM Consult  Post Acute Care Choice:  NA Choice offered to:  NA  Status of Service:  Completed, signed off  Additional Comments: # 2  S/W EYQUISHA  @ [place] # [phone] OPT- 2   ELIQUIS 10 MG - NO    1.ELIQUIS 2.5 MG  BID   COVER- YES  CO-PAY- $ 8.35  TIER- 3 DRUG  PRIOR APPROVAL- NO   2. ELIQUIS  5 MG BID  COVER- YES  CO-PAY- $ 8.35  TIER- [place]- NO Sherald Barge, RN 07/10/2016, 2:34 PM

## 2016-07-18 DIAGNOSIS — Z7901 Long term (current) use of anticoagulants: Secondary | ICD-10-CM | POA: Diagnosis not present

## 2016-07-18 DIAGNOSIS — Z86718 Personal history of other venous thrombosis and embolism: Secondary | ICD-10-CM | POA: Diagnosis not present

## 2016-07-18 DIAGNOSIS — N183 Chronic kidney disease, stage 3 (moderate): Secondary | ICD-10-CM | POA: Diagnosis not present

## 2016-07-18 DIAGNOSIS — Z6829 Body mass index (BMI) 29.0-29.9, adult: Secondary | ICD-10-CM | POA: Diagnosis not present

## 2016-07-18 DIAGNOSIS — R338 Other retention of urine: Secondary | ICD-10-CM | POA: Diagnosis not present

## 2016-08-08 DIAGNOSIS — N183 Chronic kidney disease, stage 3 (moderate): Secondary | ICD-10-CM | POA: Diagnosis not present

## 2016-08-08 DIAGNOSIS — I129 Hypertensive chronic kidney disease with stage 1 through stage 4 chronic kidney disease, or unspecified chronic kidney disease: Secondary | ICD-10-CM | POA: Diagnosis not present

## 2016-08-08 DIAGNOSIS — I10 Essential (primary) hypertension: Secondary | ICD-10-CM | POA: Diagnosis not present

## 2016-08-21 DIAGNOSIS — R338 Other retention of urine: Secondary | ICD-10-CM | POA: Diagnosis not present

## 2016-09-04 DIAGNOSIS — R339 Retention of urine, unspecified: Secondary | ICD-10-CM | POA: Diagnosis not present

## 2016-09-13 DIAGNOSIS — R338 Other retention of urine: Secondary | ICD-10-CM | POA: Diagnosis not present

## 2016-09-18 DIAGNOSIS — Z86718 Personal history of other venous thrombosis and embolism: Secondary | ICD-10-CM | POA: Diagnosis not present

## 2016-09-18 DIAGNOSIS — N183 Chronic kidney disease, stage 3 (moderate): Secondary | ICD-10-CM | POA: Diagnosis not present

## 2016-09-18 DIAGNOSIS — I10 Essential (primary) hypertension: Secondary | ICD-10-CM | POA: Diagnosis not present

## 2016-10-17 DIAGNOSIS — R338 Other retention of urine: Secondary | ICD-10-CM | POA: Diagnosis not present

## 2016-11-17 DIAGNOSIS — R338 Other retention of urine: Secondary | ICD-10-CM | POA: Diagnosis not present

## 2016-12-18 DIAGNOSIS — D509 Iron deficiency anemia, unspecified: Secondary | ICD-10-CM | POA: Diagnosis not present

## 2016-12-18 DIAGNOSIS — I10 Essential (primary) hypertension: Secondary | ICD-10-CM | POA: Diagnosis not present

## 2016-12-18 DIAGNOSIS — R338 Other retention of urine: Secondary | ICD-10-CM | POA: Diagnosis not present

## 2017-01-16 DIAGNOSIS — K08409 Partial loss of teeth, unspecified cause, unspecified class: Secondary | ICD-10-CM | POA: Diagnosis not present

## 2017-01-16 DIAGNOSIS — Z7901 Long term (current) use of anticoagulants: Secondary | ICD-10-CM | POA: Diagnosis not present

## 2017-01-16 DIAGNOSIS — I10 Essential (primary) hypertension: Secondary | ICD-10-CM | POA: Diagnosis not present

## 2017-01-16 DIAGNOSIS — Z0000 Encounter for general adult medical examination without abnormal findings: Secondary | ICD-10-CM | POA: Diagnosis not present

## 2017-01-16 DIAGNOSIS — K649 Unspecified hemorrhoids: Secondary | ICD-10-CM | POA: Diagnosis not present

## 2017-01-16 DIAGNOSIS — Z6829 Body mass index (BMI) 29.0-29.9, adult: Secondary | ICD-10-CM | POA: Diagnosis not present

## 2017-01-16 DIAGNOSIS — Z79899 Other long term (current) drug therapy: Secondary | ICD-10-CM | POA: Diagnosis not present

## 2017-01-16 DIAGNOSIS — Z8249 Family history of ischemic heart disease and other diseases of the circulatory system: Secondary | ICD-10-CM | POA: Diagnosis not present

## 2017-01-16 DIAGNOSIS — R69 Illness, unspecified: Secondary | ICD-10-CM | POA: Diagnosis not present

## 2017-01-16 DIAGNOSIS — R0602 Shortness of breath: Secondary | ICD-10-CM | POA: Diagnosis not present

## 2017-01-17 DIAGNOSIS — R338 Other retention of urine: Secondary | ICD-10-CM | POA: Diagnosis not present

## 2017-02-20 DIAGNOSIS — Z86718 Personal history of other venous thrombosis and embolism: Secondary | ICD-10-CM | POA: Diagnosis not present

## 2017-02-20 DIAGNOSIS — M25561 Pain in right knee: Secondary | ICD-10-CM | POA: Diagnosis not present

## 2017-02-20 DIAGNOSIS — D509 Iron deficiency anemia, unspecified: Secondary | ICD-10-CM | POA: Diagnosis not present

## 2017-02-20 DIAGNOSIS — E782 Mixed hyperlipidemia: Secondary | ICD-10-CM | POA: Diagnosis not present

## 2017-02-20 DIAGNOSIS — R69 Illness, unspecified: Secondary | ICD-10-CM | POA: Diagnosis not present

## 2017-02-20 DIAGNOSIS — N183 Chronic kidney disease, stage 3 (moderate): Secondary | ICD-10-CM | POA: Diagnosis not present

## 2017-02-20 DIAGNOSIS — Z6830 Body mass index (BMI) 30.0-30.9, adult: Secondary | ICD-10-CM | POA: Diagnosis not present

## 2017-02-20 DIAGNOSIS — I10 Essential (primary) hypertension: Secondary | ICD-10-CM | POA: Diagnosis not present

## 2017-02-20 DIAGNOSIS — R0602 Shortness of breath: Secondary | ICD-10-CM | POA: Diagnosis not present

## 2017-02-20 DIAGNOSIS — R600 Localized edema: Secondary | ICD-10-CM | POA: Diagnosis not present

## 2017-02-20 DIAGNOSIS — F5101 Primary insomnia: Secondary | ICD-10-CM | POA: Diagnosis not present

## 2017-02-21 DIAGNOSIS — R338 Other retention of urine: Secondary | ICD-10-CM | POA: Diagnosis not present

## 2017-03-22 DIAGNOSIS — R0602 Shortness of breath: Secondary | ICD-10-CM | POA: Diagnosis not present

## 2017-03-22 DIAGNOSIS — R600 Localized edema: Secondary | ICD-10-CM | POA: Diagnosis not present

## 2017-03-22 DIAGNOSIS — F5101 Primary insomnia: Secondary | ICD-10-CM | POA: Diagnosis not present

## 2017-03-22 DIAGNOSIS — M25561 Pain in right knee: Secondary | ICD-10-CM | POA: Diagnosis not present

## 2017-03-22 DIAGNOSIS — Z86718 Personal history of other venous thrombosis and embolism: Secondary | ICD-10-CM | POA: Diagnosis not present

## 2017-03-22 DIAGNOSIS — E782 Mixed hyperlipidemia: Secondary | ICD-10-CM | POA: Diagnosis not present

## 2017-03-22 DIAGNOSIS — Z6830 Body mass index (BMI) 30.0-30.9, adult: Secondary | ICD-10-CM | POA: Diagnosis not present

## 2017-03-22 DIAGNOSIS — Z1331 Encounter for screening for depression: Secondary | ICD-10-CM | POA: Diagnosis not present

## 2017-03-22 DIAGNOSIS — R69 Illness, unspecified: Secondary | ICD-10-CM | POA: Diagnosis not present

## 2017-03-22 DIAGNOSIS — Z85528 Personal history of other malignant neoplasm of kidney: Secondary | ICD-10-CM | POA: Diagnosis not present

## 2017-03-22 DIAGNOSIS — I10 Essential (primary) hypertension: Secondary | ICD-10-CM | POA: Diagnosis not present

## 2017-03-22 DIAGNOSIS — N183 Chronic kidney disease, stage 3 (moderate): Secondary | ICD-10-CM | POA: Diagnosis not present

## 2017-03-26 DIAGNOSIS — R338 Other retention of urine: Secondary | ICD-10-CM | POA: Diagnosis not present

## 2017-04-16 DIAGNOSIS — R339 Retention of urine, unspecified: Secondary | ICD-10-CM | POA: Diagnosis not present

## 2017-04-16 DIAGNOSIS — C649 Malignant neoplasm of unspecified kidney, except renal pelvis: Secondary | ICD-10-CM | POA: Diagnosis not present

## 2017-04-16 DIAGNOSIS — Z85528 Personal history of other malignant neoplasm of kidney: Secondary | ICD-10-CM | POA: Diagnosis not present

## 2017-04-18 DIAGNOSIS — R338 Other retention of urine: Secondary | ICD-10-CM | POA: Diagnosis not present

## 2017-05-24 DIAGNOSIS — R338 Other retention of urine: Secondary | ICD-10-CM | POA: Diagnosis not present

## 2017-06-21 DIAGNOSIS — R338 Other retention of urine: Secondary | ICD-10-CM | POA: Diagnosis not present

## 2017-06-29 ENCOUNTER — Ambulatory Visit (HOSPITAL_COMMUNITY)
Admission: RE | Admit: 2017-06-29 | Discharge: 2017-06-29 | Disposition: A | Discharge status: Home / Self Care | Payer: Medicare HMO | Source: Ambulatory Visit | Attending: Adult Health Nurse Practitioner | Admitting: Adult Health Nurse Practitioner

## 2017-06-29 ENCOUNTER — Other Ambulatory Visit (HOSPITAL_COMMUNITY): Payer: Self-pay | Admitting: Adult Health Nurse Practitioner

## 2017-06-29 DIAGNOSIS — Z6830 Body mass index (BMI) 30.0-30.9, adult: Secondary | ICD-10-CM | POA: Diagnosis not present

## 2017-06-29 DIAGNOSIS — R05 Cough: Secondary | ICD-10-CM | POA: Diagnosis not present

## 2017-06-29 DIAGNOSIS — E782 Mixed hyperlipidemia: Secondary | ICD-10-CM | POA: Diagnosis not present

## 2017-06-29 DIAGNOSIS — R0602 Shortness of breath: Secondary | ICD-10-CM | POA: Diagnosis not present

## 2017-06-29 DIAGNOSIS — J9811 Atelectasis: Secondary | ICD-10-CM | POA: Insufficient documentation

## 2017-06-29 DIAGNOSIS — Z86718 Personal history of other venous thrombosis and embolism: Secondary | ICD-10-CM | POA: Diagnosis not present

## 2017-06-29 DIAGNOSIS — Z7901 Long term (current) use of anticoagulants: Secondary | ICD-10-CM | POA: Diagnosis not present

## 2017-06-29 DIAGNOSIS — M25561 Pain in right knee: Secondary | ICD-10-CM | POA: Diagnosis not present

## 2017-06-29 DIAGNOSIS — R69 Illness, unspecified: Secondary | ICD-10-CM | POA: Diagnosis not present

## 2017-06-29 DIAGNOSIS — I10 Essential (primary) hypertension: Secondary | ICD-10-CM | POA: Diagnosis not present

## 2017-06-29 DIAGNOSIS — R600 Localized edema: Secondary | ICD-10-CM | POA: Diagnosis not present

## 2017-06-29 DIAGNOSIS — D509 Iron deficiency anemia, unspecified: Secondary | ICD-10-CM | POA: Diagnosis not present

## 2017-07-19 DIAGNOSIS — R69 Illness, unspecified: Secondary | ICD-10-CM | POA: Diagnosis not present

## 2017-07-19 DIAGNOSIS — M25561 Pain in right knee: Secondary | ICD-10-CM | POA: Diagnosis not present

## 2017-07-19 DIAGNOSIS — I10 Essential (primary) hypertension: Secondary | ICD-10-CM | POA: Diagnosis not present

## 2017-07-19 DIAGNOSIS — E782 Mixed hyperlipidemia: Secondary | ICD-10-CM | POA: Diagnosis not present

## 2017-07-19 DIAGNOSIS — Z85528 Personal history of other malignant neoplasm of kidney: Secondary | ICD-10-CM | POA: Diagnosis not present

## 2017-07-19 DIAGNOSIS — D509 Iron deficiency anemia, unspecified: Secondary | ICD-10-CM | POA: Diagnosis not present

## 2017-07-19 DIAGNOSIS — N183 Chronic kidney disease, stage 3 (moderate): Secondary | ICD-10-CM | POA: Diagnosis not present

## 2017-07-19 DIAGNOSIS — Z86718 Personal history of other venous thrombosis and embolism: Secondary | ICD-10-CM | POA: Diagnosis not present

## 2017-07-19 DIAGNOSIS — Z7901 Long term (current) use of anticoagulants: Secondary | ICD-10-CM | POA: Diagnosis not present

## 2017-07-19 DIAGNOSIS — R600 Localized edema: Secondary | ICD-10-CM | POA: Diagnosis not present

## 2017-07-23 DIAGNOSIS — I10 Essential (primary) hypertension: Secondary | ICD-10-CM | POA: Diagnosis not present

## 2017-07-23 DIAGNOSIS — Z85528 Personal history of other malignant neoplasm of kidney: Secondary | ICD-10-CM | POA: Diagnosis not present

## 2017-07-23 DIAGNOSIS — Z86718 Personal history of other venous thrombosis and embolism: Secondary | ICD-10-CM | POA: Diagnosis not present

## 2017-07-23 DIAGNOSIS — J441 Chronic obstructive pulmonary disease with (acute) exacerbation: Secondary | ICD-10-CM | POA: Diagnosis not present

## 2017-07-23 DIAGNOSIS — R600 Localized edema: Secondary | ICD-10-CM | POA: Diagnosis not present

## 2017-07-23 DIAGNOSIS — M25561 Pain in right knee: Secondary | ICD-10-CM | POA: Diagnosis not present

## 2017-07-23 DIAGNOSIS — N183 Chronic kidney disease, stage 3 (moderate): Secondary | ICD-10-CM | POA: Diagnosis not present

## 2017-07-23 DIAGNOSIS — E782 Mixed hyperlipidemia: Secondary | ICD-10-CM | POA: Diagnosis not present

## 2017-07-23 DIAGNOSIS — R69 Illness, unspecified: Secondary | ICD-10-CM | POA: Diagnosis not present

## 2017-07-23 DIAGNOSIS — R0602 Shortness of breath: Secondary | ICD-10-CM | POA: Diagnosis not present

## 2017-08-01 DIAGNOSIS — R338 Other retention of urine: Secondary | ICD-10-CM | POA: Diagnosis not present

## 2017-10-01 DIAGNOSIS — J441 Chronic obstructive pulmonary disease with (acute) exacerbation: Secondary | ICD-10-CM | POA: Diagnosis not present

## 2017-10-01 DIAGNOSIS — Z1331 Encounter for screening for depression: Secondary | ICD-10-CM | POA: Diagnosis not present

## 2017-10-01 DIAGNOSIS — N183 Chronic kidney disease, stage 3 (moderate): Secondary | ICD-10-CM | POA: Diagnosis not present

## 2017-10-01 DIAGNOSIS — Z85528 Personal history of other malignant neoplasm of kidney: Secondary | ICD-10-CM | POA: Diagnosis not present

## 2017-10-01 DIAGNOSIS — E782 Mixed hyperlipidemia: Secondary | ICD-10-CM | POA: Diagnosis not present

## 2017-10-01 DIAGNOSIS — I10 Essential (primary) hypertension: Secondary | ICD-10-CM | POA: Diagnosis not present

## 2017-10-01 DIAGNOSIS — R69 Illness, unspecified: Secondary | ICD-10-CM | POA: Diagnosis not present

## 2017-10-01 DIAGNOSIS — Z7901 Long term (current) use of anticoagulants: Secondary | ICD-10-CM | POA: Diagnosis not present

## 2017-10-30 DIAGNOSIS — R338 Other retention of urine: Secondary | ICD-10-CM | POA: Diagnosis not present

## 2018-01-23 DIAGNOSIS — D509 Iron deficiency anemia, unspecified: Secondary | ICD-10-CM | POA: Diagnosis not present

## 2018-01-23 DIAGNOSIS — I10 Essential (primary) hypertension: Secondary | ICD-10-CM | POA: Diagnosis not present

## 2018-01-23 DIAGNOSIS — E782 Mixed hyperlipidemia: Secondary | ICD-10-CM | POA: Diagnosis not present

## 2018-01-23 DIAGNOSIS — N183 Chronic kidney disease, stage 3 (moderate): Secondary | ICD-10-CM | POA: Diagnosis not present

## 2018-01-25 DIAGNOSIS — Z85528 Personal history of other malignant neoplasm of kidney: Secondary | ICD-10-CM | POA: Diagnosis not present

## 2018-01-25 DIAGNOSIS — I10 Essential (primary) hypertension: Secondary | ICD-10-CM | POA: Diagnosis not present

## 2018-01-25 DIAGNOSIS — M25561 Pain in right knee: Secondary | ICD-10-CM | POA: Diagnosis not present

## 2018-01-25 DIAGNOSIS — N183 Chronic kidney disease, stage 3 (moderate): Secondary | ICD-10-CM | POA: Diagnosis not present

## 2018-01-25 DIAGNOSIS — R69 Illness, unspecified: Secondary | ICD-10-CM | POA: Diagnosis not present

## 2018-01-25 DIAGNOSIS — R0602 Shortness of breath: Secondary | ICD-10-CM | POA: Diagnosis not present

## 2018-01-25 DIAGNOSIS — Z23 Encounter for immunization: Secondary | ICD-10-CM | POA: Diagnosis not present

## 2018-01-25 DIAGNOSIS — R600 Localized edema: Secondary | ICD-10-CM | POA: Diagnosis not present

## 2018-01-25 DIAGNOSIS — Z86718 Personal history of other venous thrombosis and embolism: Secondary | ICD-10-CM | POA: Diagnosis not present

## 2018-01-25 DIAGNOSIS — E782 Mixed hyperlipidemia: Secondary | ICD-10-CM | POA: Diagnosis not present

## 2018-01-28 DIAGNOSIS — R338 Other retention of urine: Secondary | ICD-10-CM | POA: Diagnosis not present

## 2018-03-09 IMAGING — DX DG CHEST 2V
2 series · 2 of 2 positions shown · non-contrast
Comparison: Chest x-ray 01/10/2015.

CLINICAL DATA: 72-year-old female with shortness of breath
worsening over the past 24 hours. Left leg swelling since [REDACTED].

EXAM:
CHEST  2 VIEW

[chest pa]
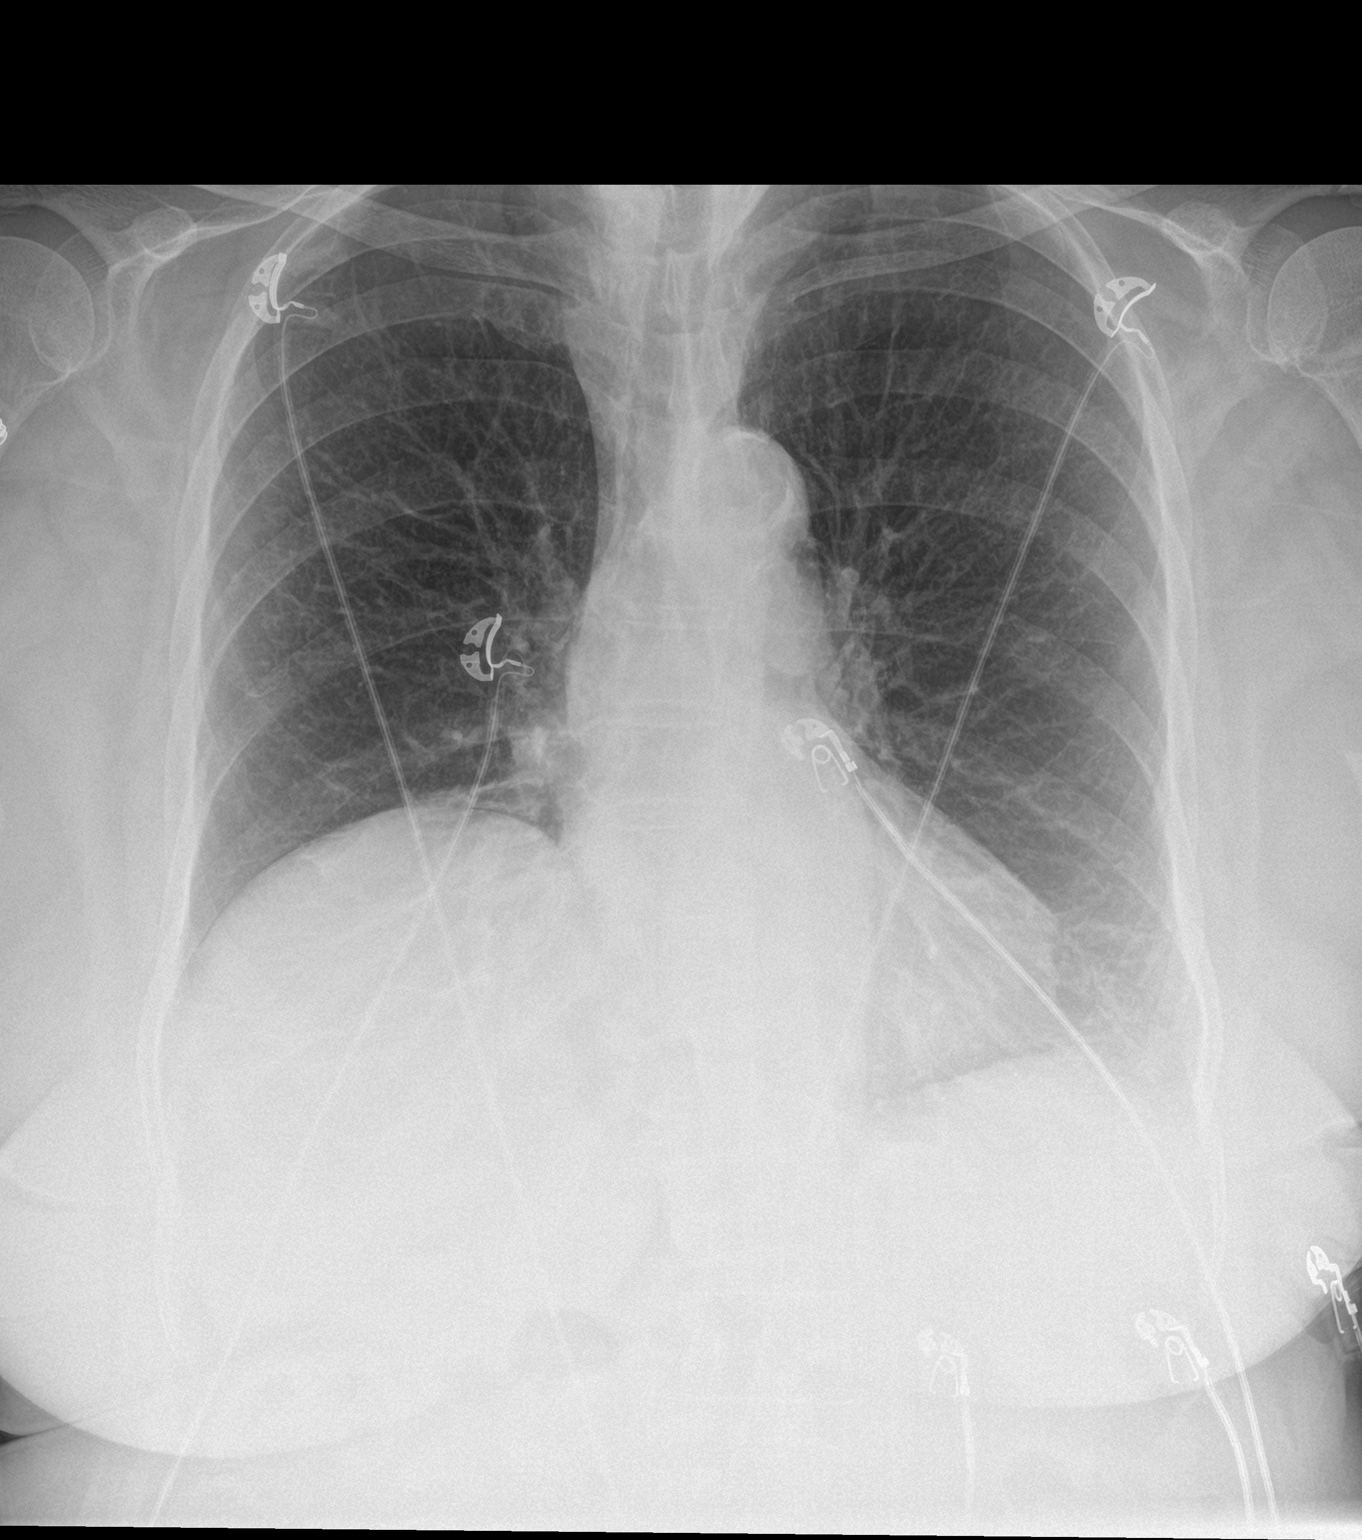

[chest lat]
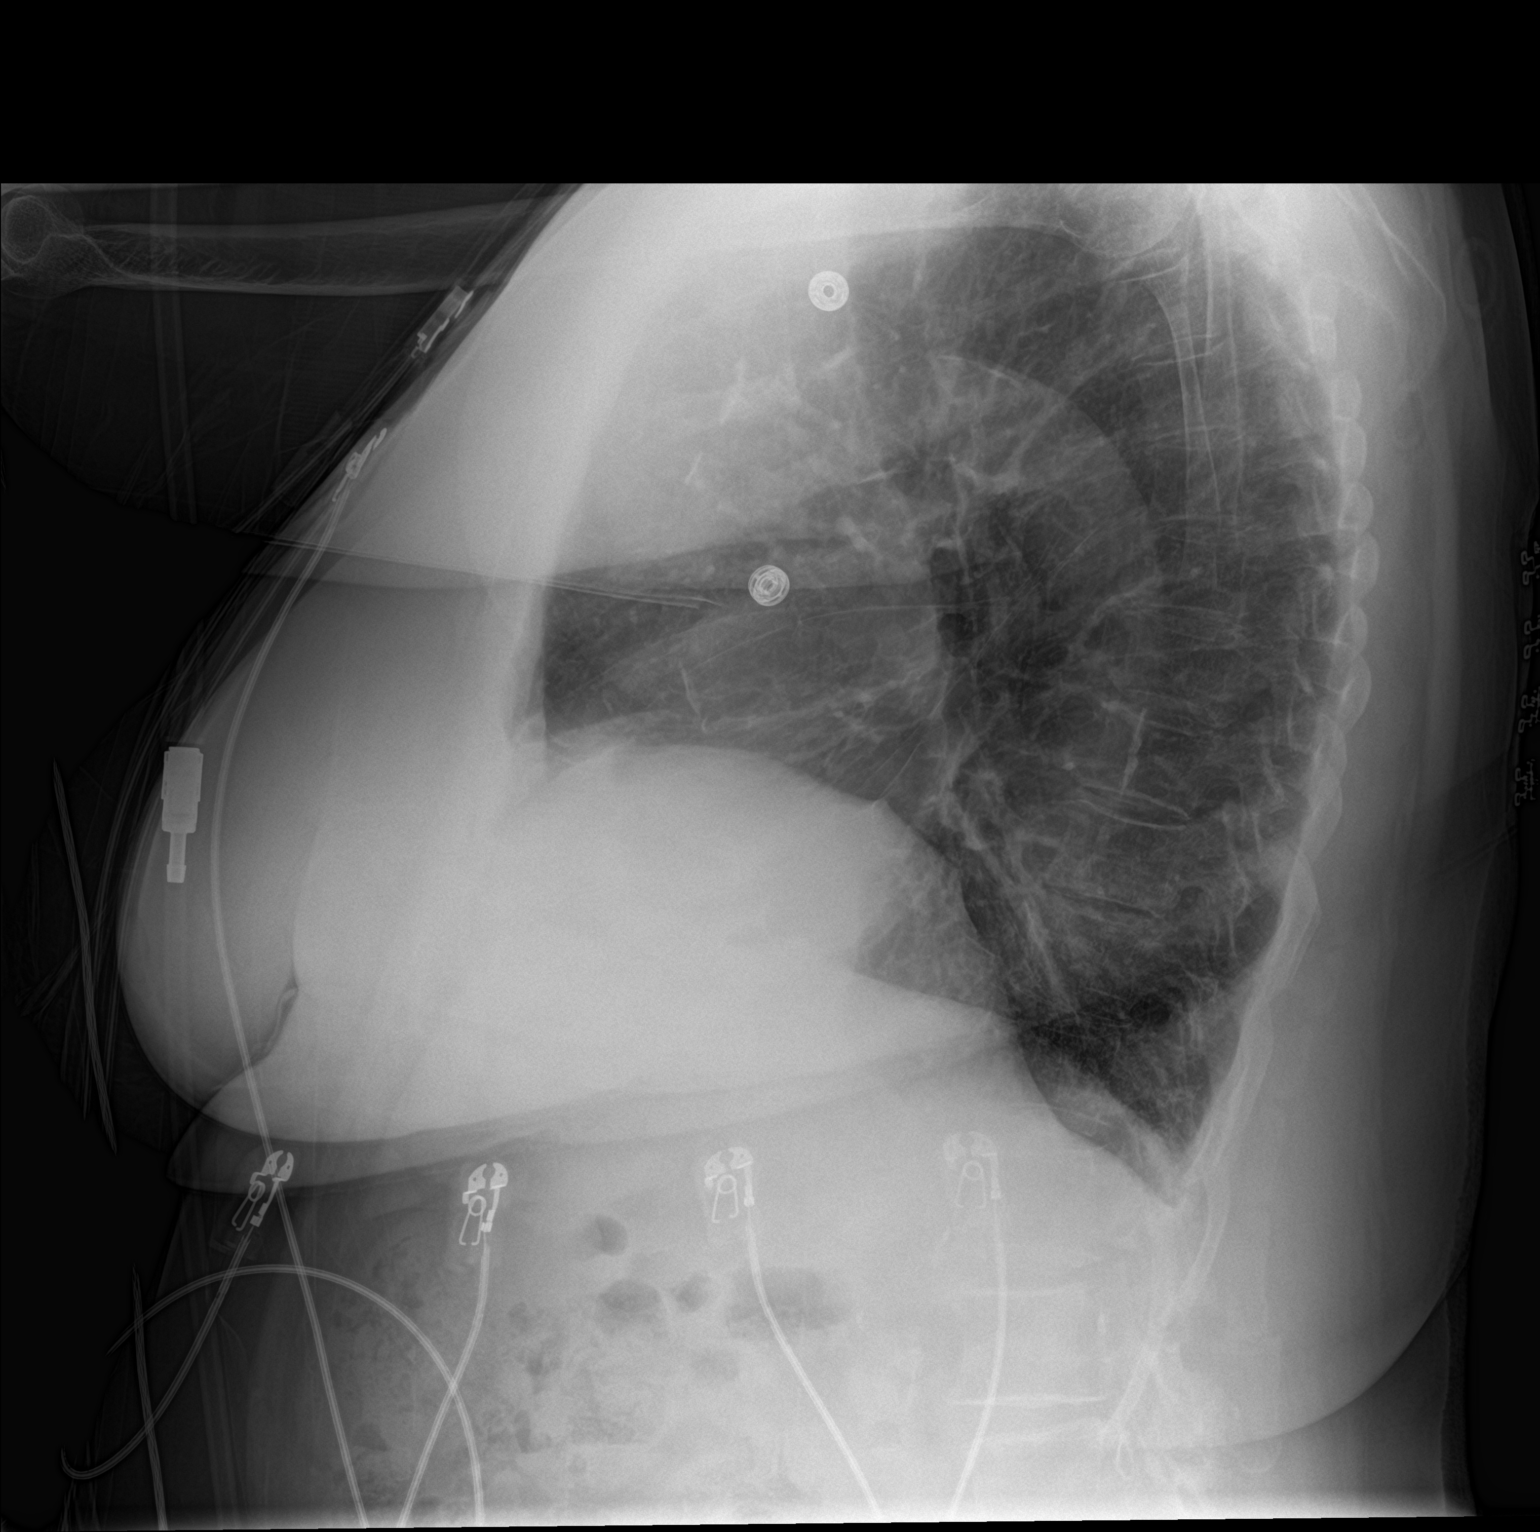

[2 of 2 positions shown; findings below may reference images not displayed]

FINDINGS: Elevation of the right hemidiaphragm. Lung volumes are normal. No
consolidative airspace disease. No pleural effusions. No
pneumothorax. No pulmonary nodule or mass noted. Pulmonary
vasculature and the cardiomediastinal silhouette are within normal
limits. Atherosclerosis in the thoracic aorta.
IMPRESSION: 1. No radiographic evidence of acute cardiopulmonary disease.
2. Chronic elevation of the right hemidiaphragm redemonstrated.
3. Aortic atherosclerosis.

## 2018-04-23 DIAGNOSIS — R338 Other retention of urine: Secondary | ICD-10-CM | POA: Diagnosis not present

## 2018-04-26 DIAGNOSIS — H04123 Dry eye syndrome of bilateral lacrimal glands: Secondary | ICD-10-CM | POA: Diagnosis not present

## 2018-04-26 DIAGNOSIS — H524 Presbyopia: Secondary | ICD-10-CM | POA: Diagnosis not present

## 2018-04-26 DIAGNOSIS — H25813 Combined forms of age-related cataract, bilateral: Secondary | ICD-10-CM | POA: Diagnosis not present

## 2018-05-21 DIAGNOSIS — I2609 Other pulmonary embolism with acute cor pulmonale: Secondary | ICD-10-CM | POA: Diagnosis not present

## 2018-05-21 DIAGNOSIS — M5127 Other intervertebral disc displacement, lumbosacral region: Secondary | ICD-10-CM | POA: Diagnosis not present

## 2018-05-21 DIAGNOSIS — R062 Wheezing: Secondary | ICD-10-CM | POA: Diagnosis not present

## 2018-05-30 DIAGNOSIS — H25812 Combined forms of age-related cataract, left eye: Secondary | ICD-10-CM | POA: Diagnosis not present

## 2018-06-21 DIAGNOSIS — I2609 Other pulmonary embolism with acute cor pulmonale: Secondary | ICD-10-CM | POA: Diagnosis not present

## 2018-06-21 DIAGNOSIS — M5127 Other intervertebral disc displacement, lumbosacral region: Secondary | ICD-10-CM | POA: Diagnosis not present

## 2018-06-21 DIAGNOSIS — R062 Wheezing: Secondary | ICD-10-CM | POA: Diagnosis not present

## 2018-07-01 DIAGNOSIS — Z0000 Encounter for general adult medical examination without abnormal findings: Secondary | ICD-10-CM | POA: Diagnosis not present

## 2018-07-16 DIAGNOSIS — R338 Other retention of urine: Secondary | ICD-10-CM | POA: Diagnosis not present

## 2018-07-21 DIAGNOSIS — I2609 Other pulmonary embolism with acute cor pulmonale: Secondary | ICD-10-CM | POA: Diagnosis not present

## 2018-07-21 DIAGNOSIS — M5127 Other intervertebral disc displacement, lumbosacral region: Secondary | ICD-10-CM | POA: Diagnosis not present

## 2018-07-21 DIAGNOSIS — R062 Wheezing: Secondary | ICD-10-CM | POA: Diagnosis not present

## 2018-08-21 DIAGNOSIS — R062 Wheezing: Secondary | ICD-10-CM | POA: Diagnosis not present

## 2018-08-21 DIAGNOSIS — I2609 Other pulmonary embolism with acute cor pulmonale: Secondary | ICD-10-CM | POA: Diagnosis not present

## 2018-08-21 DIAGNOSIS — M5127 Other intervertebral disc displacement, lumbosacral region: Secondary | ICD-10-CM | POA: Diagnosis not present

## 2018-08-26 DIAGNOSIS — I10 Essential (primary) hypertension: Secondary | ICD-10-CM | POA: Diagnosis not present

## 2018-08-26 DIAGNOSIS — N183 Chronic kidney disease, stage 3 (moderate): Secondary | ICD-10-CM | POA: Diagnosis not present

## 2018-08-26 DIAGNOSIS — D509 Iron deficiency anemia, unspecified: Secondary | ICD-10-CM | POA: Diagnosis not present

## 2018-08-26 DIAGNOSIS — E782 Mixed hyperlipidemia: Secondary | ICD-10-CM | POA: Diagnosis not present

## 2018-08-29 DIAGNOSIS — R69 Illness, unspecified: Secondary | ICD-10-CM | POA: Diagnosis not present

## 2018-08-29 DIAGNOSIS — M25561 Pain in right knee: Secondary | ICD-10-CM | POA: Diagnosis not present

## 2018-08-29 DIAGNOSIS — Z86718 Personal history of other venous thrombosis and embolism: Secondary | ICD-10-CM | POA: Diagnosis not present

## 2018-08-29 DIAGNOSIS — N183 Chronic kidney disease, stage 3 (moderate): Secondary | ICD-10-CM | POA: Diagnosis not present

## 2018-08-29 DIAGNOSIS — J449 Chronic obstructive pulmonary disease, unspecified: Secondary | ICD-10-CM | POA: Diagnosis not present

## 2018-08-29 DIAGNOSIS — I10 Essential (primary) hypertension: Secondary | ICD-10-CM | POA: Diagnosis not present

## 2018-08-29 DIAGNOSIS — Z85528 Personal history of other malignant neoplasm of kidney: Secondary | ICD-10-CM | POA: Diagnosis not present

## 2018-08-29 DIAGNOSIS — R600 Localized edema: Secondary | ICD-10-CM | POA: Diagnosis not present

## 2018-08-29 DIAGNOSIS — R0602 Shortness of breath: Secondary | ICD-10-CM | POA: Diagnosis not present

## 2018-08-29 DIAGNOSIS — E782 Mixed hyperlipidemia: Secondary | ICD-10-CM | POA: Diagnosis not present

## 2018-09-04 DIAGNOSIS — I10 Essential (primary) hypertension: Secondary | ICD-10-CM | POA: Diagnosis not present

## 2018-09-04 DIAGNOSIS — R69 Illness, unspecified: Secondary | ICD-10-CM | POA: Diagnosis not present

## 2018-09-04 DIAGNOSIS — J449 Chronic obstructive pulmonary disease, unspecified: Secondary | ICD-10-CM | POA: Diagnosis not present

## 2018-09-04 DIAGNOSIS — Z85528 Personal history of other malignant neoplasm of kidney: Secondary | ICD-10-CM | POA: Diagnosis not present

## 2018-09-04 DIAGNOSIS — Z86718 Personal history of other venous thrombosis and embolism: Secondary | ICD-10-CM | POA: Diagnosis not present

## 2018-09-04 DIAGNOSIS — E782 Mixed hyperlipidemia: Secondary | ICD-10-CM | POA: Diagnosis not present

## 2018-09-04 DIAGNOSIS — R600 Localized edema: Secondary | ICD-10-CM | POA: Diagnosis not present

## 2018-09-04 DIAGNOSIS — N183 Chronic kidney disease, stage 3 (moderate): Secondary | ICD-10-CM | POA: Diagnosis not present

## 2018-09-09 DIAGNOSIS — H25812 Combined forms of age-related cataract, left eye: Secondary | ICD-10-CM | POA: Diagnosis not present

## 2018-09-09 DIAGNOSIS — H2512 Age-related nuclear cataract, left eye: Secondary | ICD-10-CM | POA: Diagnosis not present

## 2018-09-09 DIAGNOSIS — Z01818 Encounter for other preprocedural examination: Secondary | ICD-10-CM | POA: Diagnosis not present

## 2018-09-20 DIAGNOSIS — I2609 Other pulmonary embolism with acute cor pulmonale: Secondary | ICD-10-CM | POA: Diagnosis not present

## 2018-09-20 DIAGNOSIS — M5127 Other intervertebral disc displacement, lumbosacral region: Secondary | ICD-10-CM | POA: Diagnosis not present

## 2018-09-20 DIAGNOSIS — R062 Wheezing: Secondary | ICD-10-CM | POA: Diagnosis not present

## 2018-09-27 IMAGING — US US EXTREM LOW VENOUS*L*
1 series · 13 of 24 positions shown · non-contrast
Comparison: Prior duplex venous ultrasound 02/22/2012

CLINICAL DATA: 80-year-old female with left lower extremity pain
and swelling. History of a left lower extremity DVT in 4708.



[Series 1: us extrem low venous*left* · 0.07mm/px · 13 of 62 slices shown]
[im 1/62]
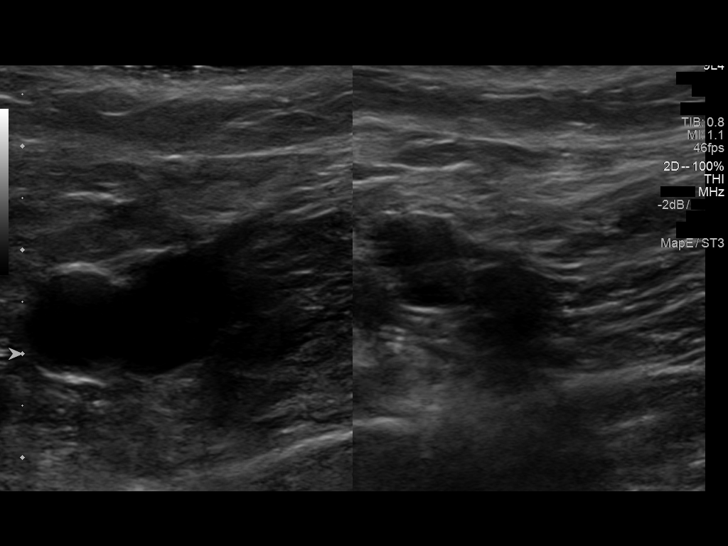
[im 6/62]
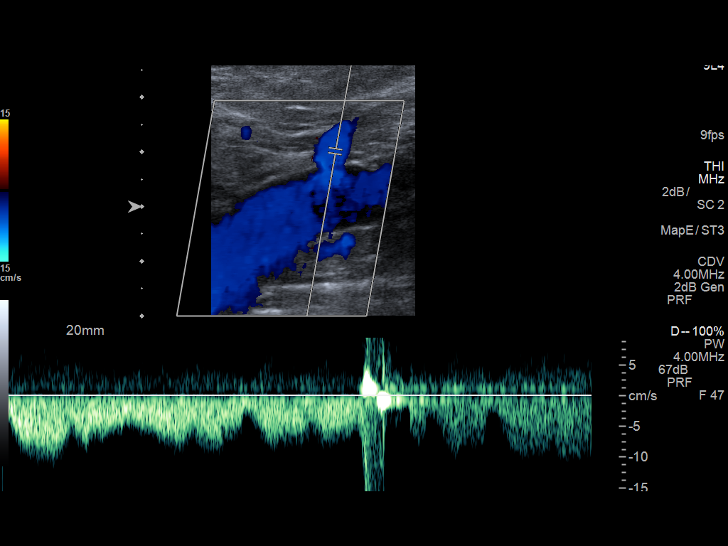
[im 11/62]
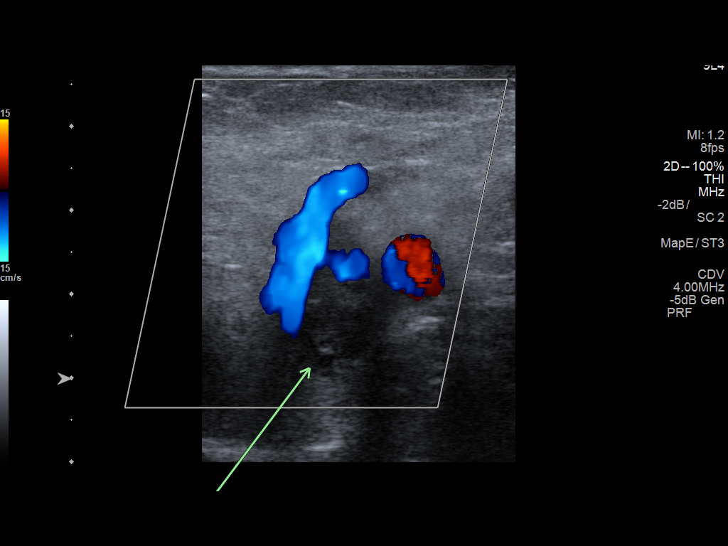
[im 16/62]
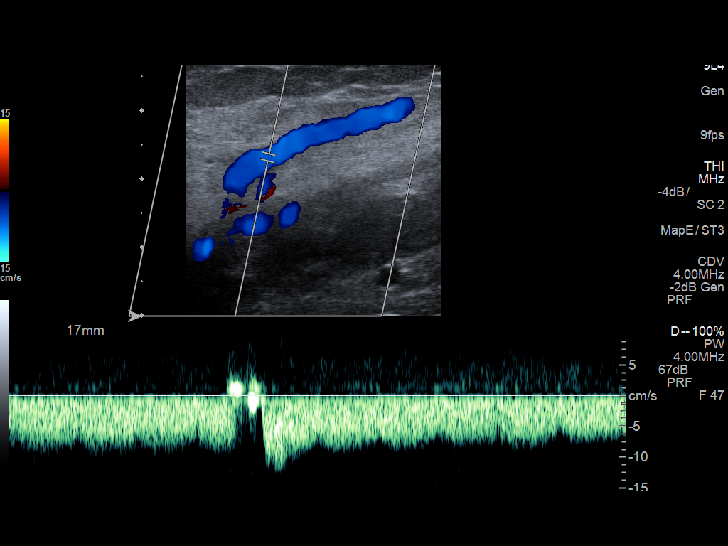
[im 22/62]
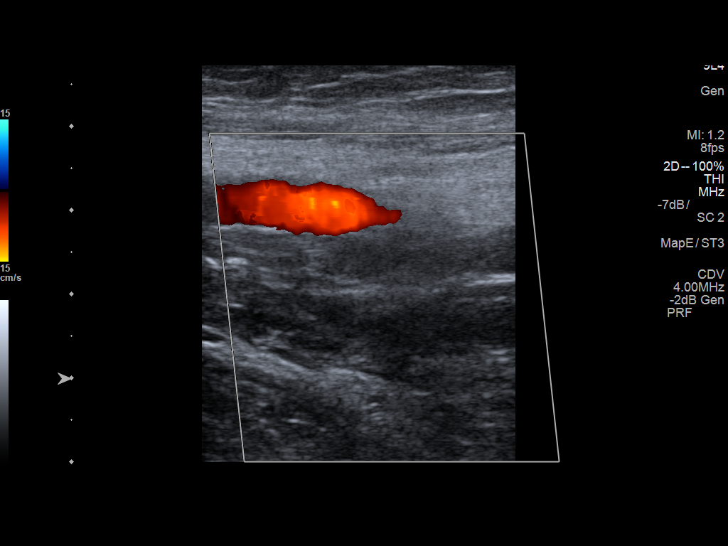
[im 27/62]
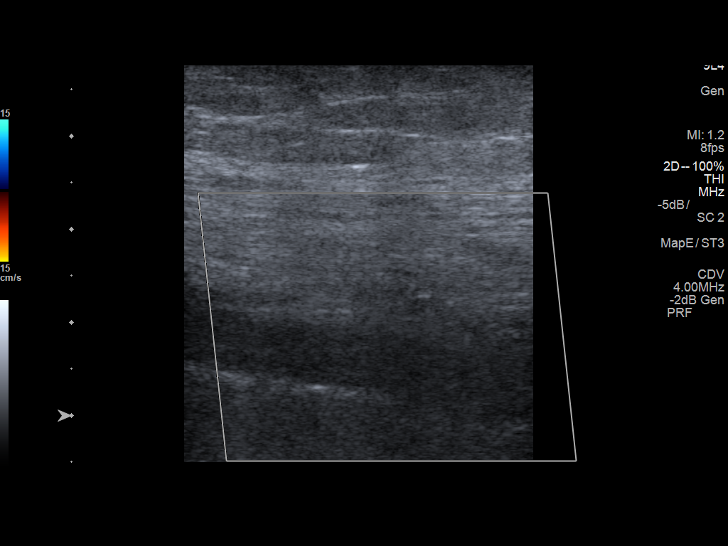
[im 32/62]
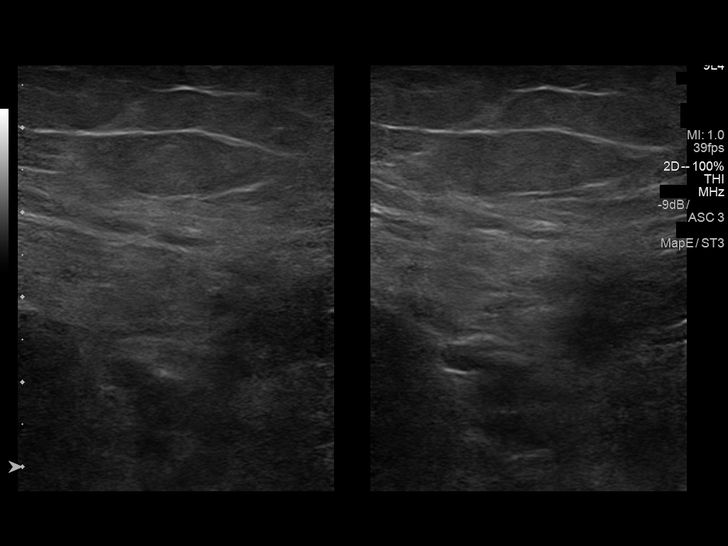
[im 35/62]
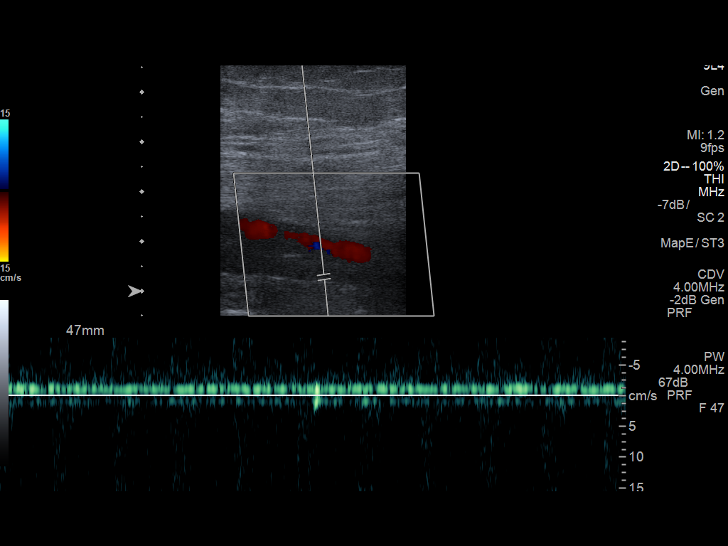
[im 40/62]
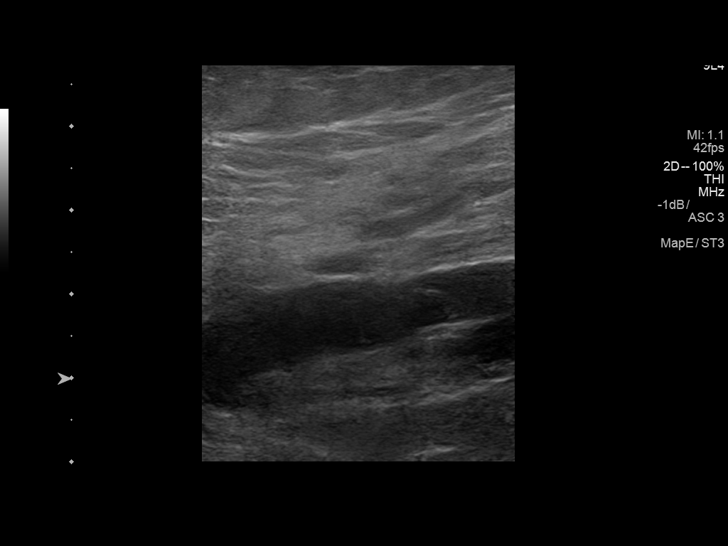
[im 46/62]
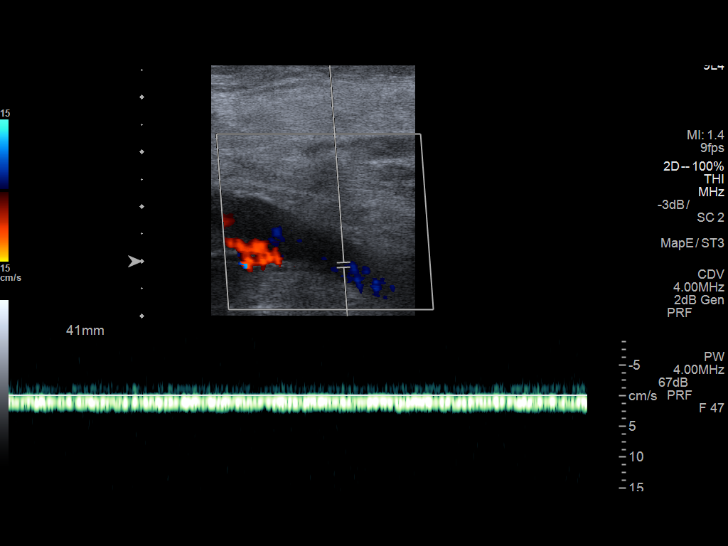
[im 51/62]
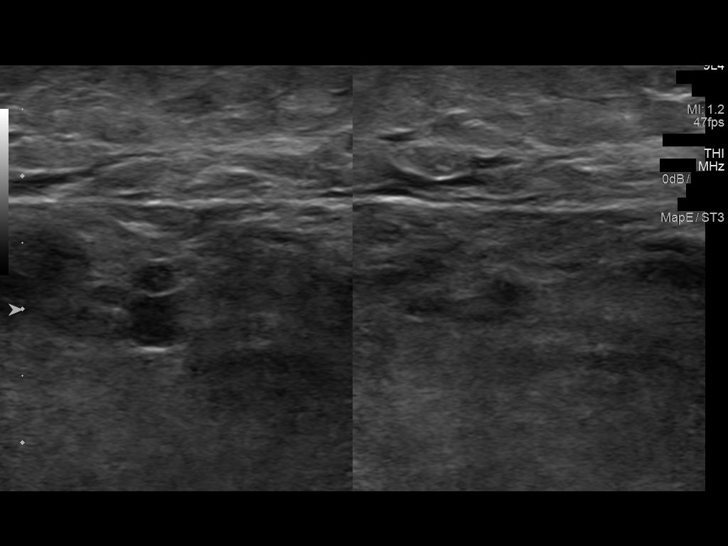
[im 56/62]
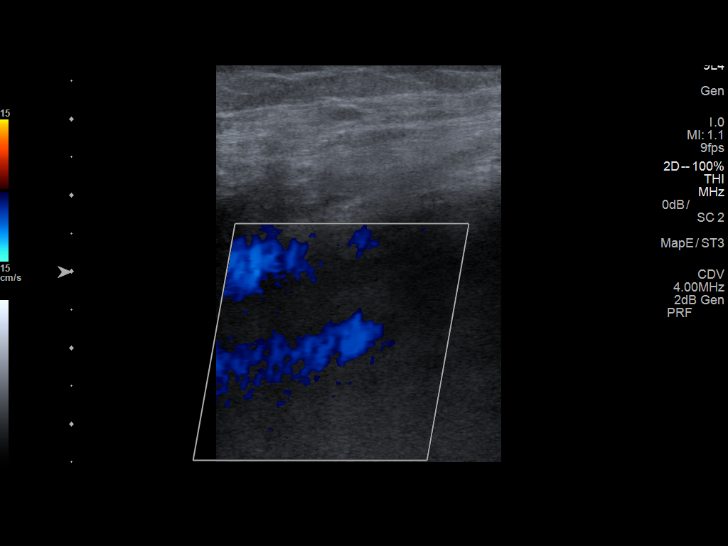
[im 62/62]
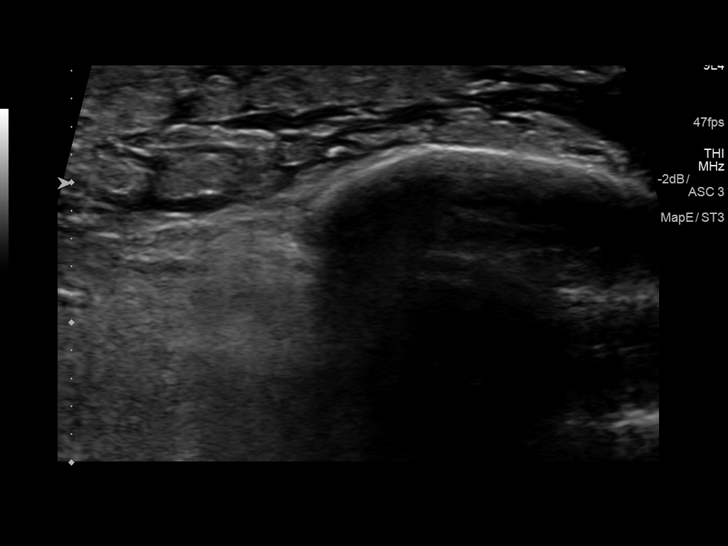

[13 of 24 positions shown; findings below may reference images not displayed]

FINDINGS: Contralateral Common Femoral Vein: Respiratory phasicity is normal
and symmetric with the symptomatic side. No evidence of thrombus.
Normal compressibility.

Common Femoral Vein: Proximal common femoral vein is patent.
However, distal to the origin of the great saphenous vein, the
common femoral vein is noncompressible and contains echogenic
thrombus.

Saphenofemoral Junction: No evidence of thrombus. Normal
compressibility and flow on color Doppler imaging.

Profunda Femoral Vein: Thrombus extends into the profunda femoral
vein.

Femoral Vein: Thrombus extends into the femoral vein in the thigh
and extends throughout the thigh into the popliteal vein.

Popliteal Vein: Thrombus extends into the proximal popliteal vein.
The more distal popliteal vein demonstrates color flow on Doppler
imaging.

Calf Veins: No evidence of thrombus. Normal compressibility and flow
on color Doppler imaging.

Superficial Great Saphenous Vein: No evidence of thrombus. Normal
compressibility and flow on color Doppler imaging.

Venous Reflux:  None.

Other Findings: The small saphenous vein is also thrombosed. Edema
present within the superficial soft tissues of the lower leg.
IMPRESSION: 1. Positive for acute, or acute on chronic deep venous thrombosis in
the left lower extremity extending from the common femoral vein into
the proximal popliteal vein. The more distal popliteal and calf
veins remain patent.
2. Positive for superficial venous thrombosis involving the small
saphenous vein in the posterior calf.
These results will be called to the ordering clinician or
representative by the Radiologist Assistant, and communication
documented in the PACS or zVision Dashboard.

## 2018-10-01 DIAGNOSIS — J449 Chronic obstructive pulmonary disease, unspecified: Secondary | ICD-10-CM | POA: Diagnosis not present

## 2018-10-01 DIAGNOSIS — R69 Illness, unspecified: Secondary | ICD-10-CM | POA: Diagnosis not present

## 2018-10-01 DIAGNOSIS — N183 Chronic kidney disease, stage 3 (moderate): Secondary | ICD-10-CM | POA: Diagnosis not present

## 2018-10-01 DIAGNOSIS — E782 Mixed hyperlipidemia: Secondary | ICD-10-CM | POA: Diagnosis not present

## 2018-10-01 DIAGNOSIS — R600 Localized edema: Secondary | ICD-10-CM | POA: Diagnosis not present

## 2018-10-01 DIAGNOSIS — Z86718 Personal history of other venous thrombosis and embolism: Secondary | ICD-10-CM | POA: Diagnosis not present

## 2018-10-01 DIAGNOSIS — Z85528 Personal history of other malignant neoplasm of kidney: Secondary | ICD-10-CM | POA: Diagnosis not present

## 2018-10-01 DIAGNOSIS — I10 Essential (primary) hypertension: Secondary | ICD-10-CM | POA: Diagnosis not present

## 2018-10-11 DIAGNOSIS — R338 Other retention of urine: Secondary | ICD-10-CM | POA: Diagnosis not present

## 2018-10-21 DIAGNOSIS — R062 Wheezing: Secondary | ICD-10-CM | POA: Diagnosis not present

## 2018-10-21 DIAGNOSIS — I2609 Other pulmonary embolism with acute cor pulmonale: Secondary | ICD-10-CM | POA: Diagnosis not present

## 2018-10-21 DIAGNOSIS — M5127 Other intervertebral disc displacement, lumbosacral region: Secondary | ICD-10-CM | POA: Diagnosis not present

## 2018-10-31 DIAGNOSIS — R600 Localized edema: Secondary | ICD-10-CM | POA: Diagnosis not present

## 2018-10-31 DIAGNOSIS — N183 Chronic kidney disease, stage 3 (moderate): Secondary | ICD-10-CM | POA: Diagnosis not present

## 2018-10-31 DIAGNOSIS — Z85528 Personal history of other malignant neoplasm of kidney: Secondary | ICD-10-CM | POA: Diagnosis not present

## 2018-10-31 DIAGNOSIS — J449 Chronic obstructive pulmonary disease, unspecified: Secondary | ICD-10-CM | POA: Diagnosis not present

## 2018-10-31 DIAGNOSIS — E782 Mixed hyperlipidemia: Secondary | ICD-10-CM | POA: Diagnosis not present

## 2018-10-31 DIAGNOSIS — Z86718 Personal history of other venous thrombosis and embolism: Secondary | ICD-10-CM | POA: Diagnosis not present

## 2018-10-31 DIAGNOSIS — I10 Essential (primary) hypertension: Secondary | ICD-10-CM | POA: Diagnosis not present

## 2018-10-31 DIAGNOSIS — R69 Illness, unspecified: Secondary | ICD-10-CM | POA: Diagnosis not present

## 2018-11-13 DIAGNOSIS — M25551 Pain in right hip: Secondary | ICD-10-CM | POA: Diagnosis not present

## 2018-11-13 DIAGNOSIS — G4762 Sleep related leg cramps: Secondary | ICD-10-CM | POA: Diagnosis not present

## 2018-11-13 DIAGNOSIS — D6869 Other thrombophilia: Secondary | ICD-10-CM | POA: Diagnosis not present

## 2018-11-13 DIAGNOSIS — R69 Illness, unspecified: Secondary | ICD-10-CM | POA: Diagnosis not present

## 2018-11-13 DIAGNOSIS — R600 Localized edema: Secondary | ICD-10-CM | POA: Diagnosis not present

## 2018-11-13 DIAGNOSIS — N183 Chronic kidney disease, stage 3 (moderate): Secondary | ICD-10-CM | POA: Diagnosis not present

## 2018-11-13 DIAGNOSIS — M545 Low back pain: Secondary | ICD-10-CM | POA: Diagnosis not present

## 2018-11-13 DIAGNOSIS — Z86718 Personal history of other venous thrombosis and embolism: Secondary | ICD-10-CM | POA: Diagnosis not present

## 2018-11-13 DIAGNOSIS — I10 Essential (primary) hypertension: Secondary | ICD-10-CM | POA: Diagnosis not present

## 2018-11-21 DIAGNOSIS — R062 Wheezing: Secondary | ICD-10-CM | POA: Diagnosis not present

## 2018-11-21 DIAGNOSIS — I2609 Other pulmonary embolism with acute cor pulmonale: Secondary | ICD-10-CM | POA: Diagnosis not present

## 2018-11-21 DIAGNOSIS — M5127 Other intervertebral disc displacement, lumbosacral region: Secondary | ICD-10-CM | POA: Diagnosis not present

## 2018-11-29 ENCOUNTER — Other Ambulatory Visit (HOSPITAL_COMMUNITY): Payer: Self-pay | Admitting: Internal Medicine

## 2018-11-29 ENCOUNTER — Other Ambulatory Visit: Payer: Self-pay

## 2018-11-29 ENCOUNTER — Ambulatory Visit (HOSPITAL_COMMUNITY)
Admission: RE | Admit: 2018-11-29 | Discharge: 2018-11-29 | Disposition: A | Discharge status: Home / Self Care | Payer: Medicare HMO | Source: Ambulatory Visit | Attending: Internal Medicine | Admitting: Internal Medicine

## 2018-11-29 DIAGNOSIS — M25551 Pain in right hip: Secondary | ICD-10-CM | POA: Diagnosis not present

## 2018-11-29 DIAGNOSIS — M1611 Unilateral primary osteoarthritis, right hip: Secondary | ICD-10-CM | POA: Diagnosis not present

## 2018-12-02 DIAGNOSIS — I10 Essential (primary) hypertension: Secondary | ICD-10-CM | POA: Diagnosis not present

## 2018-12-02 DIAGNOSIS — N183 Chronic kidney disease, stage 3 (moderate): Secondary | ICD-10-CM | POA: Diagnosis not present

## 2018-12-02 DIAGNOSIS — Z86718 Personal history of other venous thrombosis and embolism: Secondary | ICD-10-CM | POA: Diagnosis not present

## 2018-12-02 DIAGNOSIS — R69 Illness, unspecified: Secondary | ICD-10-CM | POA: Diagnosis not present

## 2018-12-02 DIAGNOSIS — D6869 Other thrombophilia: Secondary | ICD-10-CM | POA: Diagnosis not present

## 2018-12-02 DIAGNOSIS — R600 Localized edema: Secondary | ICD-10-CM | POA: Diagnosis not present

## 2018-12-02 DIAGNOSIS — G4762 Sleep related leg cramps: Secondary | ICD-10-CM | POA: Diagnosis not present

## 2018-12-02 DIAGNOSIS — M25551 Pain in right hip: Secondary | ICD-10-CM | POA: Diagnosis not present

## 2018-12-02 DIAGNOSIS — M545 Low back pain: Secondary | ICD-10-CM | POA: Diagnosis not present

## 2018-12-09 DIAGNOSIS — R339 Retention of urine, unspecified: Secondary | ICD-10-CM | POA: Diagnosis not present

## 2018-12-09 DIAGNOSIS — Z85528 Personal history of other malignant neoplasm of kidney: Secondary | ICD-10-CM | POA: Diagnosis not present

## 2018-12-09 DIAGNOSIS — Z905 Acquired absence of kidney: Secondary | ICD-10-CM | POA: Diagnosis not present

## 2018-12-21 DIAGNOSIS — M5127 Other intervertebral disc displacement, lumbosacral region: Secondary | ICD-10-CM | POA: Diagnosis not present

## 2018-12-21 DIAGNOSIS — I2609 Other pulmonary embolism with acute cor pulmonale: Secondary | ICD-10-CM | POA: Diagnosis not present

## 2018-12-21 DIAGNOSIS — R062 Wheezing: Secondary | ICD-10-CM | POA: Diagnosis not present

## 2019-01-01 DIAGNOSIS — R338 Other retention of urine: Secondary | ICD-10-CM | POA: Diagnosis not present

## 2019-01-02 DIAGNOSIS — Z85528 Personal history of other malignant neoplasm of kidney: Secondary | ICD-10-CM | POA: Diagnosis not present

## 2019-01-02 DIAGNOSIS — E782 Mixed hyperlipidemia: Secondary | ICD-10-CM | POA: Diagnosis not present

## 2019-01-02 DIAGNOSIS — R69 Illness, unspecified: Secondary | ICD-10-CM | POA: Diagnosis not present

## 2019-01-02 DIAGNOSIS — Z86718 Personal history of other venous thrombosis and embolism: Secondary | ICD-10-CM | POA: Diagnosis not present

## 2019-01-02 DIAGNOSIS — R600 Localized edema: Secondary | ICD-10-CM | POA: Diagnosis not present

## 2019-01-02 DIAGNOSIS — J449 Chronic obstructive pulmonary disease, unspecified: Secondary | ICD-10-CM | POA: Diagnosis not present

## 2019-01-02 DIAGNOSIS — I10 Essential (primary) hypertension: Secondary | ICD-10-CM | POA: Diagnosis not present

## 2019-01-09 DIAGNOSIS — R338 Other retention of urine: Secondary | ICD-10-CM | POA: Diagnosis not present

## 2019-01-21 DIAGNOSIS — M5127 Other intervertebral disc displacement, lumbosacral region: Secondary | ICD-10-CM | POA: Diagnosis not present

## 2019-01-21 DIAGNOSIS — R062 Wheezing: Secondary | ICD-10-CM | POA: Diagnosis not present

## 2019-01-21 DIAGNOSIS — I2609 Other pulmonary embolism with acute cor pulmonale: Secondary | ICD-10-CM | POA: Diagnosis not present

## 2019-01-22 DIAGNOSIS — E782 Mixed hyperlipidemia: Secondary | ICD-10-CM | POA: Diagnosis not present

## 2019-01-22 DIAGNOSIS — R600 Localized edema: Secondary | ICD-10-CM | POA: Diagnosis not present

## 2019-01-22 DIAGNOSIS — I10 Essential (primary) hypertension: Secondary | ICD-10-CM | POA: Diagnosis not present

## 2019-01-22 DIAGNOSIS — J449 Chronic obstructive pulmonary disease, unspecified: Secondary | ICD-10-CM | POA: Diagnosis not present

## 2019-01-22 DIAGNOSIS — Z86718 Personal history of other venous thrombosis and embolism: Secondary | ICD-10-CM | POA: Diagnosis not present

## 2019-01-22 DIAGNOSIS — R69 Illness, unspecified: Secondary | ICD-10-CM | POA: Diagnosis not present

## 2019-01-22 DIAGNOSIS — Z85528 Personal history of other malignant neoplasm of kidney: Secondary | ICD-10-CM | POA: Diagnosis not present

## 2019-02-19 DIAGNOSIS — Z23 Encounter for immunization: Secondary | ICD-10-CM | POA: Diagnosis not present

## 2019-02-19 DIAGNOSIS — D509 Iron deficiency anemia, unspecified: Secondary | ICD-10-CM | POA: Diagnosis not present

## 2019-02-19 DIAGNOSIS — E782 Mixed hyperlipidemia: Secondary | ICD-10-CM | POA: Diagnosis not present

## 2019-02-19 DIAGNOSIS — R7301 Impaired fasting glucose: Secondary | ICD-10-CM | POA: Diagnosis not present

## 2019-02-19 DIAGNOSIS — I10 Essential (primary) hypertension: Secondary | ICD-10-CM | POA: Diagnosis not present

## 2019-02-19 DIAGNOSIS — Z1329 Encounter for screening for other suspected endocrine disorder: Secondary | ICD-10-CM | POA: Diagnosis not present

## 2019-02-19 DIAGNOSIS — N183 Chronic kidney disease, stage 3 unspecified: Secondary | ICD-10-CM | POA: Diagnosis not present

## 2019-02-20 DIAGNOSIS — R062 Wheezing: Secondary | ICD-10-CM | POA: Diagnosis not present

## 2019-02-20 DIAGNOSIS — M5127 Other intervertebral disc displacement, lumbosacral region: Secondary | ICD-10-CM | POA: Diagnosis not present

## 2019-02-20 DIAGNOSIS — I2609 Other pulmonary embolism with acute cor pulmonale: Secondary | ICD-10-CM | POA: Diagnosis not present

## 2019-02-25 DIAGNOSIS — I10 Essential (primary) hypertension: Secondary | ICD-10-CM | POA: Diagnosis not present

## 2019-02-25 DIAGNOSIS — J449 Chronic obstructive pulmonary disease, unspecified: Secondary | ICD-10-CM | POA: Diagnosis not present

## 2019-02-25 DIAGNOSIS — M25562 Pain in left knee: Secondary | ICD-10-CM | POA: Diagnosis not present

## 2019-02-25 DIAGNOSIS — R69 Illness, unspecified: Secondary | ICD-10-CM | POA: Diagnosis not present

## 2019-02-25 DIAGNOSIS — Z86718 Personal history of other venous thrombosis and embolism: Secondary | ICD-10-CM | POA: Diagnosis not present

## 2019-02-25 DIAGNOSIS — Z85528 Personal history of other malignant neoplasm of kidney: Secondary | ICD-10-CM | POA: Diagnosis not present

## 2019-02-25 DIAGNOSIS — R600 Localized edema: Secondary | ICD-10-CM | POA: Diagnosis not present

## 2019-02-25 DIAGNOSIS — R7301 Impaired fasting glucose: Secondary | ICD-10-CM | POA: Diagnosis not present

## 2019-02-25 DIAGNOSIS — E782 Mixed hyperlipidemia: Secondary | ICD-10-CM | POA: Diagnosis not present

## 2019-02-25 DIAGNOSIS — M25561 Pain in right knee: Secondary | ICD-10-CM | POA: Diagnosis not present

## 2019-03-06 DIAGNOSIS — M542 Cervicalgia: Secondary | ICD-10-CM | POA: Diagnosis not present

## 2019-03-06 DIAGNOSIS — M62838 Other muscle spasm: Secondary | ICD-10-CM | POA: Diagnosis not present

## 2019-03-23 DIAGNOSIS — I2609 Other pulmonary embolism with acute cor pulmonale: Secondary | ICD-10-CM | POA: Diagnosis not present

## 2019-03-23 DIAGNOSIS — M5127 Other intervertebral disc displacement, lumbosacral region: Secondary | ICD-10-CM | POA: Diagnosis not present

## 2019-03-23 DIAGNOSIS — R062 Wheezing: Secondary | ICD-10-CM | POA: Diagnosis not present

## 2019-04-03 DIAGNOSIS — M542 Cervicalgia: Secondary | ICD-10-CM | POA: Diagnosis not present

## 2019-04-23 DIAGNOSIS — R062 Wheezing: Secondary | ICD-10-CM | POA: Diagnosis not present

## 2019-04-23 DIAGNOSIS — M5127 Other intervertebral disc displacement, lumbosacral region: Secondary | ICD-10-CM | POA: Diagnosis not present

## 2019-04-23 DIAGNOSIS — R338 Other retention of urine: Secondary | ICD-10-CM | POA: Diagnosis not present

## 2019-04-23 DIAGNOSIS — I2609 Other pulmonary embolism with acute cor pulmonale: Secondary | ICD-10-CM | POA: Diagnosis not present

## 2019-05-21 DIAGNOSIS — M5127 Other intervertebral disc displacement, lumbosacral region: Secondary | ICD-10-CM | POA: Diagnosis not present

## 2019-05-21 DIAGNOSIS — I2609 Other pulmonary embolism with acute cor pulmonale: Secondary | ICD-10-CM | POA: Diagnosis not present

## 2019-05-21 DIAGNOSIS — R062 Wheezing: Secondary | ICD-10-CM | POA: Diagnosis not present

## 2019-06-21 DIAGNOSIS — M5127 Other intervertebral disc displacement, lumbosacral region: Secondary | ICD-10-CM | POA: Diagnosis not present

## 2019-06-21 DIAGNOSIS — I2609 Other pulmonary embolism with acute cor pulmonale: Secondary | ICD-10-CM | POA: Diagnosis not present

## 2019-06-21 DIAGNOSIS — R062 Wheezing: Secondary | ICD-10-CM | POA: Diagnosis not present

## 2019-07-21 DIAGNOSIS — R062 Wheezing: Secondary | ICD-10-CM | POA: Diagnosis not present

## 2019-07-21 DIAGNOSIS — I2609 Other pulmonary embolism with acute cor pulmonale: Secondary | ICD-10-CM | POA: Diagnosis not present

## 2019-07-21 DIAGNOSIS — M5127 Other intervertebral disc displacement, lumbosacral region: Secondary | ICD-10-CM | POA: Diagnosis not present

## 2019-07-22 DIAGNOSIS — R338 Other retention of urine: Secondary | ICD-10-CM | POA: Diagnosis not present

## 2019-08-21 DIAGNOSIS — R062 Wheezing: Secondary | ICD-10-CM | POA: Diagnosis not present

## 2019-08-21 DIAGNOSIS — M5127 Other intervertebral disc displacement, lumbosacral region: Secondary | ICD-10-CM | POA: Diagnosis not present

## 2019-08-21 DIAGNOSIS — I2609 Other pulmonary embolism with acute cor pulmonale: Secondary | ICD-10-CM | POA: Diagnosis not present

## 2019-09-04 DIAGNOSIS — G4762 Sleep related leg cramps: Secondary | ICD-10-CM | POA: Diagnosis not present

## 2019-09-04 DIAGNOSIS — D509 Iron deficiency anemia, unspecified: Secondary | ICD-10-CM | POA: Diagnosis not present

## 2019-09-04 DIAGNOSIS — D6869 Other thrombophilia: Secondary | ICD-10-CM | POA: Diagnosis not present

## 2019-09-04 DIAGNOSIS — F5101 Primary insomnia: Secondary | ICD-10-CM | POA: Diagnosis not present

## 2019-09-04 DIAGNOSIS — E782 Mixed hyperlipidemia: Secondary | ICD-10-CM | POA: Diagnosis not present

## 2019-09-04 DIAGNOSIS — R7301 Impaired fasting glucose: Secondary | ICD-10-CM | POA: Diagnosis not present

## 2019-09-04 DIAGNOSIS — R7309 Other abnormal glucose: Secondary | ICD-10-CM | POA: Diagnosis not present

## 2019-09-09 DIAGNOSIS — M25561 Pain in right knee: Secondary | ICD-10-CM | POA: Diagnosis not present

## 2019-09-09 DIAGNOSIS — I10 Essential (primary) hypertension: Secondary | ICD-10-CM | POA: Diagnosis not present

## 2019-09-09 DIAGNOSIS — Z0001 Encounter for general adult medical examination with abnormal findings: Secondary | ICD-10-CM | POA: Diagnosis not present

## 2019-09-09 DIAGNOSIS — E782 Mixed hyperlipidemia: Secondary | ICD-10-CM | POA: Diagnosis not present

## 2019-09-09 DIAGNOSIS — R600 Localized edema: Secondary | ICD-10-CM | POA: Diagnosis not present

## 2019-09-20 DIAGNOSIS — R062 Wheezing: Secondary | ICD-10-CM | POA: Diagnosis not present

## 2019-09-20 DIAGNOSIS — I2609 Other pulmonary embolism with acute cor pulmonale: Secondary | ICD-10-CM | POA: Diagnosis not present

## 2019-09-20 DIAGNOSIS — M5127 Other intervertebral disc displacement, lumbosacral region: Secondary | ICD-10-CM | POA: Diagnosis not present

## 2019-10-21 DIAGNOSIS — R062 Wheezing: Secondary | ICD-10-CM | POA: Diagnosis not present

## 2019-10-21 DIAGNOSIS — M5127 Other intervertebral disc displacement, lumbosacral region: Secondary | ICD-10-CM | POA: Diagnosis not present

## 2019-10-21 DIAGNOSIS — I2609 Other pulmonary embolism with acute cor pulmonale: Secondary | ICD-10-CM | POA: Diagnosis not present

## 2019-10-24 DIAGNOSIS — R338 Other retention of urine: Secondary | ICD-10-CM | POA: Diagnosis not present

## 2019-10-29 ENCOUNTER — Other Ambulatory Visit: Payer: Self-pay | Admitting: Nephrology

## 2019-10-29 ENCOUNTER — Other Ambulatory Visit (HOSPITAL_COMMUNITY): Payer: Self-pay | Admitting: Nephrology

## 2019-10-29 DIAGNOSIS — N1832 Chronic kidney disease, stage 3b: Secondary | ICD-10-CM

## 2019-10-29 DIAGNOSIS — I129 Hypertensive chronic kidney disease with stage 1 through stage 4 chronic kidney disease, or unspecified chronic kidney disease: Secondary | ICD-10-CM | POA: Diagnosis not present

## 2019-10-29 DIAGNOSIS — E875 Hyperkalemia: Secondary | ICD-10-CM | POA: Diagnosis not present

## 2019-10-29 DIAGNOSIS — C642 Malignant neoplasm of left kidney, except renal pelvis: Secondary | ICD-10-CM

## 2019-10-29 DIAGNOSIS — R339 Retention of urine, unspecified: Secondary | ICD-10-CM | POA: Diagnosis not present

## 2019-10-29 DIAGNOSIS — C641 Malignant neoplasm of right kidney, except renal pelvis: Secondary | ICD-10-CM | POA: Diagnosis not present

## 2019-10-29 DIAGNOSIS — I10 Essential (primary) hypertension: Secondary | ICD-10-CM

## 2019-11-07 ENCOUNTER — Ambulatory Visit (HOSPITAL_COMMUNITY)
Admission: RE | Admit: 2019-11-07 | Discharge: 2019-11-07 | Disposition: A | Discharge status: Home / Self Care | Payer: Medicare Other | Source: Ambulatory Visit | Attending: Nephrology | Admitting: Nephrology

## 2019-11-07 ENCOUNTER — Other Ambulatory Visit: Payer: Self-pay

## 2019-11-07 DIAGNOSIS — I10 Essential (primary) hypertension: Secondary | ICD-10-CM | POA: Diagnosis not present

## 2019-11-07 DIAGNOSIS — C641 Malignant neoplasm of right kidney, except renal pelvis: Secondary | ICD-10-CM | POA: Diagnosis not present

## 2019-11-07 DIAGNOSIS — C642 Malignant neoplasm of left kidney, except renal pelvis: Secondary | ICD-10-CM | POA: Diagnosis not present

## 2019-11-07 DIAGNOSIS — N1832 Chronic kidney disease, stage 3b: Secondary | ICD-10-CM | POA: Diagnosis not present

## 2019-11-07 DIAGNOSIS — N281 Cyst of kidney, acquired: Secondary | ICD-10-CM | POA: Diagnosis not present

## 2019-11-07 DIAGNOSIS — R339 Retention of urine, unspecified: Secondary | ICD-10-CM | POA: Insufficient documentation

## 2019-11-14 DIAGNOSIS — I129 Hypertensive chronic kidney disease with stage 1 through stage 4 chronic kidney disease, or unspecified chronic kidney disease: Secondary | ICD-10-CM | POA: Diagnosis not present

## 2019-11-14 DIAGNOSIS — E782 Mixed hyperlipidemia: Secondary | ICD-10-CM | POA: Diagnosis not present

## 2019-11-14 DIAGNOSIS — J449 Chronic obstructive pulmonary disease, unspecified: Secondary | ICD-10-CM | POA: Diagnosis not present

## 2019-11-14 DIAGNOSIS — I10 Essential (primary) hypertension: Secondary | ICD-10-CM | POA: Diagnosis not present

## 2019-11-14 DIAGNOSIS — N183 Chronic kidney disease, stage 3 unspecified: Secondary | ICD-10-CM | POA: Diagnosis not present

## 2019-11-21 DIAGNOSIS — R062 Wheezing: Secondary | ICD-10-CM | POA: Diagnosis not present

## 2019-11-21 DIAGNOSIS — I2609 Other pulmonary embolism with acute cor pulmonale: Secondary | ICD-10-CM | POA: Diagnosis not present

## 2019-11-21 DIAGNOSIS — M5127 Other intervertebral disc displacement, lumbosacral region: Secondary | ICD-10-CM | POA: Diagnosis not present

## 2019-11-27 DIAGNOSIS — N1832 Chronic kidney disease, stage 3b: Secondary | ICD-10-CM | POA: Diagnosis not present

## 2019-11-27 DIAGNOSIS — E875 Hyperkalemia: Secondary | ICD-10-CM | POA: Diagnosis not present

## 2019-11-27 DIAGNOSIS — Z79899 Other long term (current) drug therapy: Secondary | ICD-10-CM | POA: Diagnosis not present

## 2019-11-27 DIAGNOSIS — C642 Malignant neoplasm of left kidney, except renal pelvis: Secondary | ICD-10-CM | POA: Diagnosis not present

## 2019-11-27 DIAGNOSIS — R339 Retention of urine, unspecified: Secondary | ICD-10-CM | POA: Diagnosis not present

## 2019-11-27 DIAGNOSIS — C641 Malignant neoplasm of right kidney, except renal pelvis: Secondary | ICD-10-CM | POA: Diagnosis not present

## 2019-12-03 DIAGNOSIS — R809 Proteinuria, unspecified: Secondary | ICD-10-CM | POA: Diagnosis not present

## 2019-12-03 DIAGNOSIS — E211 Secondary hyperparathyroidism, not elsewhere classified: Secondary | ICD-10-CM | POA: Diagnosis not present

## 2019-12-03 DIAGNOSIS — E611 Iron deficiency: Secondary | ICD-10-CM | POA: Diagnosis not present

## 2019-12-03 DIAGNOSIS — R768 Other specified abnormal immunological findings in serum: Secondary | ICD-10-CM | POA: Diagnosis not present

## 2019-12-03 DIAGNOSIS — N184 Chronic kidney disease, stage 4 (severe): Secondary | ICD-10-CM | POA: Diagnosis not present

## 2019-12-21 DIAGNOSIS — M5127 Other intervertebral disc displacement, lumbosacral region: Secondary | ICD-10-CM | POA: Diagnosis not present

## 2019-12-21 DIAGNOSIS — R062 Wheezing: Secondary | ICD-10-CM | POA: Diagnosis not present

## 2019-12-21 DIAGNOSIS — I2609 Other pulmonary embolism with acute cor pulmonale: Secondary | ICD-10-CM | POA: Diagnosis not present

## 2019-12-24 DIAGNOSIS — R338 Other retention of urine: Secondary | ICD-10-CM | POA: Diagnosis not present

## 2020-01-21 DIAGNOSIS — I2609 Other pulmonary embolism with acute cor pulmonale: Secondary | ICD-10-CM | POA: Diagnosis not present

## 2020-01-21 DIAGNOSIS — R062 Wheezing: Secondary | ICD-10-CM | POA: Diagnosis not present

## 2020-01-21 DIAGNOSIS — M5127 Other intervertebral disc displacement, lumbosacral region: Secondary | ICD-10-CM | POA: Diagnosis not present

## 2020-01-29 DIAGNOSIS — E211 Secondary hyperparathyroidism, not elsewhere classified: Secondary | ICD-10-CM | POA: Diagnosis not present

## 2020-01-29 DIAGNOSIS — N184 Chronic kidney disease, stage 4 (severe): Secondary | ICD-10-CM | POA: Diagnosis not present

## 2020-01-29 DIAGNOSIS — R809 Proteinuria, unspecified: Secondary | ICD-10-CM | POA: Diagnosis not present

## 2020-01-29 DIAGNOSIS — E611 Iron deficiency: Secondary | ICD-10-CM | POA: Diagnosis not present

## 2020-01-29 DIAGNOSIS — R768 Other specified abnormal immunological findings in serum: Secondary | ICD-10-CM | POA: Diagnosis not present

## 2020-01-30 DIAGNOSIS — R809 Proteinuria, unspecified: Secondary | ICD-10-CM | POA: Diagnosis not present

## 2020-01-30 DIAGNOSIS — R768 Other specified abnormal immunological findings in serum: Secondary | ICD-10-CM | POA: Diagnosis not present

## 2020-01-30 DIAGNOSIS — N184 Chronic kidney disease, stage 4 (severe): Secondary | ICD-10-CM | POA: Diagnosis not present

## 2020-01-30 DIAGNOSIS — E611 Iron deficiency: Secondary | ICD-10-CM | POA: Diagnosis not present

## 2020-01-30 DIAGNOSIS — E211 Secondary hyperparathyroidism, not elsewhere classified: Secondary | ICD-10-CM | POA: Diagnosis not present

## 2020-02-05 DIAGNOSIS — E211 Secondary hyperparathyroidism, not elsewhere classified: Secondary | ICD-10-CM | POA: Diagnosis not present

## 2020-02-05 DIAGNOSIS — N184 Chronic kidney disease, stage 4 (severe): Secondary | ICD-10-CM | POA: Diagnosis not present

## 2020-02-05 DIAGNOSIS — R768 Other specified abnormal immunological findings in serum: Secondary | ICD-10-CM | POA: Diagnosis not present

## 2020-02-05 DIAGNOSIS — R809 Proteinuria, unspecified: Secondary | ICD-10-CM | POA: Diagnosis not present

## 2020-02-05 DIAGNOSIS — E611 Iron deficiency: Secondary | ICD-10-CM | POA: Diagnosis not present

## 2020-02-20 DIAGNOSIS — I2609 Other pulmonary embolism with acute cor pulmonale: Secondary | ICD-10-CM | POA: Diagnosis not present

## 2020-02-20 DIAGNOSIS — M5127 Other intervertebral disc displacement, lumbosacral region: Secondary | ICD-10-CM | POA: Diagnosis not present

## 2020-02-20 DIAGNOSIS — R062 Wheezing: Secondary | ICD-10-CM | POA: Diagnosis not present

## 2020-03-18 DIAGNOSIS — R338 Other retention of urine: Secondary | ICD-10-CM | POA: Diagnosis not present

## 2020-03-22 DIAGNOSIS — M5127 Other intervertebral disc displacement, lumbosacral region: Secondary | ICD-10-CM | POA: Diagnosis not present

## 2020-03-22 DIAGNOSIS — I2609 Other pulmonary embolism with acute cor pulmonale: Secondary | ICD-10-CM | POA: Diagnosis not present

## 2020-03-22 DIAGNOSIS — R062 Wheezing: Secondary | ICD-10-CM | POA: Diagnosis not present

## 2020-04-22 DIAGNOSIS — I2609 Other pulmonary embolism with acute cor pulmonale: Secondary | ICD-10-CM | POA: Diagnosis not present

## 2020-04-22 DIAGNOSIS — M5127 Other intervertebral disc displacement, lumbosacral region: Secondary | ICD-10-CM | POA: Diagnosis not present

## 2020-04-22 DIAGNOSIS — R062 Wheezing: Secondary | ICD-10-CM | POA: Diagnosis not present

## 2020-05-17 DIAGNOSIS — I10 Essential (primary) hypertension: Secondary | ICD-10-CM | POA: Diagnosis not present

## 2020-05-17 DIAGNOSIS — J449 Chronic obstructive pulmonary disease, unspecified: Secondary | ICD-10-CM | POA: Diagnosis not present

## 2020-05-17 DIAGNOSIS — N1832 Chronic kidney disease, stage 3b: Secondary | ICD-10-CM | POA: Diagnosis not present

## 2020-05-17 DIAGNOSIS — R7301 Impaired fasting glucose: Secondary | ICD-10-CM | POA: Diagnosis not present

## 2020-05-17 DIAGNOSIS — Z85528 Personal history of other malignant neoplasm of kidney: Secondary | ICD-10-CM | POA: Diagnosis not present

## 2020-05-17 DIAGNOSIS — E782 Mixed hyperlipidemia: Secondary | ICD-10-CM | POA: Diagnosis not present

## 2020-05-17 DIAGNOSIS — M25562 Pain in left knee: Secondary | ICD-10-CM | POA: Diagnosis not present

## 2020-05-18 DIAGNOSIS — R338 Other retention of urine: Secondary | ICD-10-CM | POA: Diagnosis not present

## 2020-05-20 DIAGNOSIS — I2609 Other pulmonary embolism with acute cor pulmonale: Secondary | ICD-10-CM | POA: Diagnosis not present

## 2020-05-20 DIAGNOSIS — M5127 Other intervertebral disc displacement, lumbosacral region: Secondary | ICD-10-CM | POA: Diagnosis not present

## 2020-05-20 DIAGNOSIS — R062 Wheezing: Secondary | ICD-10-CM | POA: Diagnosis not present

## 2020-05-26 DIAGNOSIS — R809 Proteinuria, unspecified: Secondary | ICD-10-CM | POA: Diagnosis not present

## 2020-05-26 DIAGNOSIS — N184 Chronic kidney disease, stage 4 (severe): Secondary | ICD-10-CM | POA: Diagnosis not present

## 2020-05-26 DIAGNOSIS — E211 Secondary hyperparathyroidism, not elsewhere classified: Secondary | ICD-10-CM | POA: Diagnosis not present

## 2020-05-26 DIAGNOSIS — R768 Other specified abnormal immunological findings in serum: Secondary | ICD-10-CM | POA: Diagnosis not present

## 2020-05-26 DIAGNOSIS — E611 Iron deficiency: Secondary | ICD-10-CM | POA: Diagnosis not present

## 2020-06-02 DIAGNOSIS — R809 Proteinuria, unspecified: Secondary | ICD-10-CM | POA: Diagnosis not present

## 2020-06-02 DIAGNOSIS — I129 Hypertensive chronic kidney disease with stage 1 through stage 4 chronic kidney disease, or unspecified chronic kidney disease: Secondary | ICD-10-CM | POA: Diagnosis not present

## 2020-06-02 DIAGNOSIS — E875 Hyperkalemia: Secondary | ICD-10-CM | POA: Diagnosis not present

## 2020-06-02 DIAGNOSIS — N184 Chronic kidney disease, stage 4 (severe): Secondary | ICD-10-CM | POA: Diagnosis not present

## 2020-06-16 DIAGNOSIS — M25562 Pain in left knee: Secondary | ICD-10-CM | POA: Diagnosis not present

## 2020-06-16 DIAGNOSIS — D509 Iron deficiency anemia, unspecified: Secondary | ICD-10-CM | POA: Diagnosis not present

## 2020-06-16 DIAGNOSIS — I10 Essential (primary) hypertension: Secondary | ICD-10-CM | POA: Diagnosis not present

## 2020-06-16 DIAGNOSIS — R7301 Impaired fasting glucose: Secondary | ICD-10-CM | POA: Diagnosis not present

## 2020-06-16 DIAGNOSIS — R600 Localized edema: Secondary | ICD-10-CM | POA: Diagnosis not present

## 2020-06-16 DIAGNOSIS — N1832 Chronic kidney disease, stage 3b: Secondary | ICD-10-CM | POA: Diagnosis not present

## 2020-06-16 DIAGNOSIS — J449 Chronic obstructive pulmonary disease, unspecified: Secondary | ICD-10-CM | POA: Diagnosis not present

## 2020-06-16 DIAGNOSIS — F5101 Primary insomnia: Secondary | ICD-10-CM | POA: Diagnosis not present

## 2020-06-16 DIAGNOSIS — E782 Mixed hyperlipidemia: Secondary | ICD-10-CM | POA: Diagnosis not present

## 2020-06-20 DIAGNOSIS — M5127 Other intervertebral disc displacement, lumbosacral region: Secondary | ICD-10-CM | POA: Diagnosis not present

## 2020-06-20 DIAGNOSIS — R062 Wheezing: Secondary | ICD-10-CM | POA: Diagnosis not present

## 2020-06-20 DIAGNOSIS — I2609 Other pulmonary embolism with acute cor pulmonale: Secondary | ICD-10-CM | POA: Diagnosis not present

## 2020-06-23 DIAGNOSIS — M25562 Pain in left knee: Secondary | ICD-10-CM | POA: Diagnosis not present

## 2020-06-23 DIAGNOSIS — I10 Essential (primary) hypertension: Secondary | ICD-10-CM | POA: Diagnosis not present

## 2020-06-23 DIAGNOSIS — Z85528 Personal history of other malignant neoplasm of kidney: Secondary | ICD-10-CM | POA: Diagnosis not present

## 2020-06-23 DIAGNOSIS — R21 Rash and other nonspecific skin eruption: Secondary | ICD-10-CM | POA: Diagnosis not present

## 2020-06-23 DIAGNOSIS — F5101 Primary insomnia: Secondary | ICD-10-CM | POA: Diagnosis not present

## 2020-06-23 DIAGNOSIS — N1832 Chronic kidney disease, stage 3b: Secondary | ICD-10-CM | POA: Diagnosis not present

## 2020-06-23 DIAGNOSIS — E782 Mixed hyperlipidemia: Secondary | ICD-10-CM | POA: Diagnosis not present

## 2020-06-23 DIAGNOSIS — R7301 Impaired fasting glucose: Secondary | ICD-10-CM | POA: Diagnosis not present

## 2020-06-23 DIAGNOSIS — M25561 Pain in right knee: Secondary | ICD-10-CM | POA: Diagnosis not present

## 2020-06-23 DIAGNOSIS — R600 Localized edema: Secondary | ICD-10-CM | POA: Diagnosis not present

## 2020-06-23 DIAGNOSIS — J449 Chronic obstructive pulmonary disease, unspecified: Secondary | ICD-10-CM | POA: Diagnosis not present

## 2020-06-23 DIAGNOSIS — Z86718 Personal history of other venous thrombosis and embolism: Secondary | ICD-10-CM | POA: Diagnosis not present

## 2020-07-18 DIAGNOSIS — I10 Essential (primary) hypertension: Secondary | ICD-10-CM | POA: Diagnosis not present

## 2020-07-18 DIAGNOSIS — I129 Hypertensive chronic kidney disease with stage 1 through stage 4 chronic kidney disease, or unspecified chronic kidney disease: Secondary | ICD-10-CM | POA: Diagnosis not present

## 2020-07-20 DIAGNOSIS — I2609 Other pulmonary embolism with acute cor pulmonale: Secondary | ICD-10-CM | POA: Diagnosis not present

## 2020-07-20 DIAGNOSIS — R062 Wheezing: Secondary | ICD-10-CM | POA: Diagnosis not present

## 2020-07-20 DIAGNOSIS — M5127 Other intervertebral disc displacement, lumbosacral region: Secondary | ICD-10-CM | POA: Diagnosis not present

## 2020-07-30 DIAGNOSIS — R338 Other retention of urine: Secondary | ICD-10-CM | POA: Diagnosis not present

## 2020-07-30 DIAGNOSIS — E875 Hyperkalemia: Secondary | ICD-10-CM | POA: Diagnosis not present

## 2020-07-30 DIAGNOSIS — N184 Chronic kidney disease, stage 4 (severe): Secondary | ICD-10-CM | POA: Diagnosis not present

## 2020-07-30 DIAGNOSIS — I129 Hypertensive chronic kidney disease with stage 1 through stage 4 chronic kidney disease, or unspecified chronic kidney disease: Secondary | ICD-10-CM | POA: Diagnosis not present

## 2020-07-30 DIAGNOSIS — R809 Proteinuria, unspecified: Secondary | ICD-10-CM | POA: Diagnosis not present

## 2020-08-06 DIAGNOSIS — N184 Chronic kidney disease, stage 4 (severe): Secondary | ICD-10-CM | POA: Diagnosis not present

## 2020-08-06 DIAGNOSIS — I129 Hypertensive chronic kidney disease with stage 1 through stage 4 chronic kidney disease, or unspecified chronic kidney disease: Secondary | ICD-10-CM | POA: Diagnosis not present

## 2020-08-06 DIAGNOSIS — E211 Secondary hyperparathyroidism, not elsewhere classified: Secondary | ICD-10-CM | POA: Diagnosis not present

## 2020-08-06 DIAGNOSIS — E875 Hyperkalemia: Secondary | ICD-10-CM | POA: Diagnosis not present

## 2020-08-06 DIAGNOSIS — R809 Proteinuria, unspecified: Secondary | ICD-10-CM | POA: Diagnosis not present

## 2020-08-20 DIAGNOSIS — M5127 Other intervertebral disc displacement, lumbosacral region: Secondary | ICD-10-CM | POA: Diagnosis not present

## 2020-08-20 DIAGNOSIS — I2609 Other pulmonary embolism with acute cor pulmonale: Secondary | ICD-10-CM | POA: Diagnosis not present

## 2020-08-20 DIAGNOSIS — R062 Wheezing: Secondary | ICD-10-CM | POA: Diagnosis not present

## 2020-09-16 DIAGNOSIS — I129 Hypertensive chronic kidney disease with stage 1 through stage 4 chronic kidney disease, or unspecified chronic kidney disease: Secondary | ICD-10-CM | POA: Diagnosis not present

## 2020-09-16 DIAGNOSIS — I10 Essential (primary) hypertension: Secondary | ICD-10-CM | POA: Diagnosis not present

## 2020-09-19 DIAGNOSIS — M5127 Other intervertebral disc displacement, lumbosacral region: Secondary | ICD-10-CM | POA: Diagnosis not present

## 2020-09-19 DIAGNOSIS — R062 Wheezing: Secondary | ICD-10-CM | POA: Diagnosis not present

## 2020-09-19 DIAGNOSIS — I2609 Other pulmonary embolism with acute cor pulmonale: Secondary | ICD-10-CM | POA: Diagnosis not present

## 2020-09-29 ENCOUNTER — Other Ambulatory Visit: Payer: Self-pay | Admitting: Internal Medicine

## 2020-09-29 ENCOUNTER — Other Ambulatory Visit (HOSPITAL_COMMUNITY): Payer: Self-pay | Admitting: Internal Medicine

## 2020-09-29 DIAGNOSIS — Z85528 Personal history of other malignant neoplasm of kidney: Secondary | ICD-10-CM | POA: Diagnosis not present

## 2020-09-29 DIAGNOSIS — F5101 Primary insomnia: Secondary | ICD-10-CM | POA: Diagnosis not present

## 2020-09-29 DIAGNOSIS — J449 Chronic obstructive pulmonary disease, unspecified: Secondary | ICD-10-CM | POA: Diagnosis not present

## 2020-09-29 DIAGNOSIS — R7301 Impaired fasting glucose: Secondary | ICD-10-CM | POA: Diagnosis not present

## 2020-09-29 DIAGNOSIS — M2012 Hallux valgus (acquired), left foot: Secondary | ICD-10-CM | POA: Diagnosis not present

## 2020-09-29 DIAGNOSIS — M25562 Pain in left knee: Secondary | ICD-10-CM | POA: Diagnosis not present

## 2020-09-29 DIAGNOSIS — I10 Essential (primary) hypertension: Secondary | ICD-10-CM | POA: Diagnosis not present

## 2020-09-29 DIAGNOSIS — R600 Localized edema: Secondary | ICD-10-CM

## 2020-09-29 DIAGNOSIS — E782 Mixed hyperlipidemia: Secondary | ICD-10-CM | POA: Diagnosis not present

## 2020-09-29 DIAGNOSIS — R338 Other retention of urine: Secondary | ICD-10-CM | POA: Diagnosis not present

## 2020-09-29 DIAGNOSIS — N1832 Chronic kidney disease, stage 3b: Secondary | ICD-10-CM | POA: Diagnosis not present

## 2020-09-29 DIAGNOSIS — Z86711 Personal history of pulmonary embolism: Secondary | ICD-10-CM | POA: Diagnosis not present

## 2020-10-07 ENCOUNTER — Ambulatory Visit (HOSPITAL_COMMUNITY)
Admission: RE | Admit: 2020-10-07 | Discharge: 2020-10-07 | Disposition: A | Discharge status: Home / Self Care | Payer: Medicare Other | Source: Ambulatory Visit | Attending: Internal Medicine | Admitting: Internal Medicine

## 2020-10-07 ENCOUNTER — Other Ambulatory Visit: Payer: Self-pay

## 2020-10-07 DIAGNOSIS — R600 Localized edema: Secondary | ICD-10-CM | POA: Diagnosis not present

## 2020-10-14 DIAGNOSIS — I129 Hypertensive chronic kidney disease with stage 1 through stage 4 chronic kidney disease, or unspecified chronic kidney disease: Secondary | ICD-10-CM | POA: Diagnosis not present

## 2020-10-14 DIAGNOSIS — E211 Secondary hyperparathyroidism, not elsewhere classified: Secondary | ICD-10-CM | POA: Diagnosis not present

## 2020-10-14 DIAGNOSIS — R809 Proteinuria, unspecified: Secondary | ICD-10-CM | POA: Diagnosis not present

## 2020-10-14 DIAGNOSIS — N184 Chronic kidney disease, stage 4 (severe): Secondary | ICD-10-CM | POA: Diagnosis not present

## 2020-10-14 DIAGNOSIS — E875 Hyperkalemia: Secondary | ICD-10-CM | POA: Diagnosis not present

## 2020-10-17 DIAGNOSIS — I129 Hypertensive chronic kidney disease with stage 1 through stage 4 chronic kidney disease, or unspecified chronic kidney disease: Secondary | ICD-10-CM | POA: Diagnosis not present

## 2020-10-17 DIAGNOSIS — I10 Essential (primary) hypertension: Secondary | ICD-10-CM | POA: Diagnosis not present

## 2020-10-20 DIAGNOSIS — N184 Chronic kidney disease, stage 4 (severe): Secondary | ICD-10-CM | POA: Diagnosis not present

## 2020-10-20 DIAGNOSIS — M5127 Other intervertebral disc displacement, lumbosacral region: Secondary | ICD-10-CM | POA: Diagnosis not present

## 2020-10-20 DIAGNOSIS — I2609 Other pulmonary embolism with acute cor pulmonale: Secondary | ICD-10-CM | POA: Diagnosis not present

## 2020-10-20 DIAGNOSIS — E211 Secondary hyperparathyroidism, not elsewhere classified: Secondary | ICD-10-CM | POA: Diagnosis not present

## 2020-10-20 DIAGNOSIS — I129 Hypertensive chronic kidney disease with stage 1 through stage 4 chronic kidney disease, or unspecified chronic kidney disease: Secondary | ICD-10-CM | POA: Diagnosis not present

## 2020-10-20 DIAGNOSIS — R062 Wheezing: Secondary | ICD-10-CM | POA: Diagnosis not present

## 2020-10-20 DIAGNOSIS — R809 Proteinuria, unspecified: Secondary | ICD-10-CM | POA: Diagnosis not present

## 2020-11-15 DIAGNOSIS — I129 Hypertensive chronic kidney disease with stage 1 through stage 4 chronic kidney disease, or unspecified chronic kidney disease: Secondary | ICD-10-CM | POA: Diagnosis not present

## 2020-11-15 DIAGNOSIS — I10 Essential (primary) hypertension: Secondary | ICD-10-CM | POA: Diagnosis not present

## 2020-11-17 DIAGNOSIS — E119 Type 2 diabetes mellitus without complications: Secondary | ICD-10-CM | POA: Diagnosis not present

## 2020-11-17 DIAGNOSIS — I10 Essential (primary) hypertension: Secondary | ICD-10-CM | POA: Diagnosis not present

## 2020-11-20 DIAGNOSIS — I2609 Other pulmonary embolism with acute cor pulmonale: Secondary | ICD-10-CM | POA: Diagnosis not present

## 2020-11-20 DIAGNOSIS — M5127 Other intervertebral disc displacement, lumbosacral region: Secondary | ICD-10-CM | POA: Diagnosis not present

## 2020-11-20 DIAGNOSIS — R062 Wheezing: Secondary | ICD-10-CM | POA: Diagnosis not present

## 2020-12-05 DIAGNOSIS — R338 Other retention of urine: Secondary | ICD-10-CM | POA: Diagnosis not present

## 2020-12-16 DIAGNOSIS — E211 Secondary hyperparathyroidism, not elsewhere classified: Secondary | ICD-10-CM | POA: Diagnosis not present

## 2020-12-16 DIAGNOSIS — R809 Proteinuria, unspecified: Secondary | ICD-10-CM | POA: Diagnosis not present

## 2020-12-16 DIAGNOSIS — N184 Chronic kidney disease, stage 4 (severe): Secondary | ICD-10-CM | POA: Diagnosis not present

## 2020-12-16 DIAGNOSIS — I129 Hypertensive chronic kidney disease with stage 1 through stage 4 chronic kidney disease, or unspecified chronic kidney disease: Secondary | ICD-10-CM | POA: Diagnosis not present

## 2020-12-17 DIAGNOSIS — I10 Essential (primary) hypertension: Secondary | ICD-10-CM | POA: Diagnosis not present

## 2020-12-17 DIAGNOSIS — I129 Hypertensive chronic kidney disease with stage 1 through stage 4 chronic kidney disease, or unspecified chronic kidney disease: Secondary | ICD-10-CM | POA: Diagnosis not present

## 2020-12-17 DIAGNOSIS — E785 Hyperlipidemia, unspecified: Secondary | ICD-10-CM | POA: Diagnosis not present

## 2020-12-20 DIAGNOSIS — R062 Wheezing: Secondary | ICD-10-CM | POA: Diagnosis not present

## 2020-12-20 DIAGNOSIS — I2609 Other pulmonary embolism with acute cor pulmonale: Secondary | ICD-10-CM | POA: Diagnosis not present

## 2020-12-20 DIAGNOSIS — M5127 Other intervertebral disc displacement, lumbosacral region: Secondary | ICD-10-CM | POA: Diagnosis not present

## 2020-12-21 DIAGNOSIS — R809 Proteinuria, unspecified: Secondary | ICD-10-CM | POA: Diagnosis not present

## 2020-12-21 DIAGNOSIS — N184 Chronic kidney disease, stage 4 (severe): Secondary | ICD-10-CM | POA: Diagnosis not present

## 2020-12-21 DIAGNOSIS — E211 Secondary hyperparathyroidism, not elsewhere classified: Secondary | ICD-10-CM | POA: Diagnosis not present

## 2020-12-21 DIAGNOSIS — I129 Hypertensive chronic kidney disease with stage 1 through stage 4 chronic kidney disease, or unspecified chronic kidney disease: Secondary | ICD-10-CM | POA: Diagnosis not present

## 2020-12-24 DIAGNOSIS — I129 Hypertensive chronic kidney disease with stage 1 through stage 4 chronic kidney disease, or unspecified chronic kidney disease: Secondary | ICD-10-CM | POA: Diagnosis not present

## 2020-12-24 DIAGNOSIS — R809 Proteinuria, unspecified: Secondary | ICD-10-CM | POA: Diagnosis not present

## 2020-12-24 DIAGNOSIS — N184 Chronic kidney disease, stage 4 (severe): Secondary | ICD-10-CM | POA: Diagnosis not present

## 2020-12-24 DIAGNOSIS — E211 Secondary hyperparathyroidism, not elsewhere classified: Secondary | ICD-10-CM | POA: Diagnosis not present

## 2020-12-31 DIAGNOSIS — Z23 Encounter for immunization: Secondary | ICD-10-CM | POA: Diagnosis not present

## 2020-12-31 DIAGNOSIS — E782 Mixed hyperlipidemia: Secondary | ICD-10-CM | POA: Diagnosis not present

## 2020-12-31 DIAGNOSIS — R7301 Impaired fasting glucose: Secondary | ICD-10-CM | POA: Diagnosis not present

## 2021-01-06 DIAGNOSIS — F5101 Primary insomnia: Secondary | ICD-10-CM | POA: Diagnosis not present

## 2021-01-06 DIAGNOSIS — R600 Localized edema: Secondary | ICD-10-CM | POA: Diagnosis not present

## 2021-01-06 DIAGNOSIS — J449 Chronic obstructive pulmonary disease, unspecified: Secondary | ICD-10-CM | POA: Diagnosis not present

## 2021-01-06 DIAGNOSIS — J9611 Chronic respiratory failure with hypoxia: Secondary | ICD-10-CM | POA: Diagnosis not present

## 2021-01-06 DIAGNOSIS — M2012 Hallux valgus (acquired), left foot: Secondary | ICD-10-CM | POA: Diagnosis not present

## 2021-01-06 DIAGNOSIS — Z85528 Personal history of other malignant neoplasm of kidney: Secondary | ICD-10-CM | POA: Diagnosis not present

## 2021-01-06 DIAGNOSIS — N1832 Chronic kidney disease, stage 3b: Secondary | ICD-10-CM | POA: Diagnosis not present

## 2021-01-06 DIAGNOSIS — I10 Essential (primary) hypertension: Secondary | ICD-10-CM | POA: Diagnosis not present

## 2021-01-06 DIAGNOSIS — R7301 Impaired fasting glucose: Secondary | ICD-10-CM | POA: Diagnosis not present

## 2021-01-06 DIAGNOSIS — Z86711 Personal history of pulmonary embolism: Secondary | ICD-10-CM | POA: Diagnosis not present

## 2021-01-06 DIAGNOSIS — E782 Mixed hyperlipidemia: Secondary | ICD-10-CM | POA: Diagnosis not present

## 2021-01-06 DIAGNOSIS — M25562 Pain in left knee: Secondary | ICD-10-CM | POA: Diagnosis not present

## 2021-01-17 DIAGNOSIS — I129 Hypertensive chronic kidney disease with stage 1 through stage 4 chronic kidney disease, or unspecified chronic kidney disease: Secondary | ICD-10-CM | POA: Diagnosis not present

## 2021-01-17 DIAGNOSIS — I10 Essential (primary) hypertension: Secondary | ICD-10-CM | POA: Diagnosis not present

## 2021-01-17 DIAGNOSIS — E782 Mixed hyperlipidemia: Secondary | ICD-10-CM | POA: Diagnosis not present

## 2021-01-20 DIAGNOSIS — R062 Wheezing: Secondary | ICD-10-CM | POA: Diagnosis not present

## 2021-01-20 DIAGNOSIS — I2609 Other pulmonary embolism with acute cor pulmonale: Secondary | ICD-10-CM | POA: Diagnosis not present

## 2021-01-20 DIAGNOSIS — M5127 Other intervertebral disc displacement, lumbosacral region: Secondary | ICD-10-CM | POA: Diagnosis not present

## 2021-02-04 DIAGNOSIS — R338 Other retention of urine: Secondary | ICD-10-CM | POA: Diagnosis not present

## 2021-02-16 DIAGNOSIS — I10 Essential (primary) hypertension: Secondary | ICD-10-CM | POA: Diagnosis not present

## 2021-02-16 DIAGNOSIS — I129 Hypertensive chronic kidney disease with stage 1 through stage 4 chronic kidney disease, or unspecified chronic kidney disease: Secondary | ICD-10-CM | POA: Diagnosis not present

## 2021-02-19 DIAGNOSIS — M5127 Other intervertebral disc displacement, lumbosacral region: Secondary | ICD-10-CM | POA: Diagnosis not present

## 2021-02-19 DIAGNOSIS — I2609 Other pulmonary embolism with acute cor pulmonale: Secondary | ICD-10-CM | POA: Diagnosis not present

## 2021-02-19 DIAGNOSIS — R062 Wheezing: Secondary | ICD-10-CM | POA: Diagnosis not present

## 2021-02-25 DIAGNOSIS — I129 Hypertensive chronic kidney disease with stage 1 through stage 4 chronic kidney disease, or unspecified chronic kidney disease: Secondary | ICD-10-CM | POA: Diagnosis not present

## 2021-02-25 DIAGNOSIS — N184 Chronic kidney disease, stage 4 (severe): Secondary | ICD-10-CM | POA: Diagnosis not present

## 2021-02-25 DIAGNOSIS — R809 Proteinuria, unspecified: Secondary | ICD-10-CM | POA: Diagnosis not present

## 2021-02-25 DIAGNOSIS — E211 Secondary hyperparathyroidism, not elsewhere classified: Secondary | ICD-10-CM | POA: Diagnosis not present

## 2021-03-04 DIAGNOSIS — N184 Chronic kidney disease, stage 4 (severe): Secondary | ICD-10-CM | POA: Diagnosis not present

## 2021-03-04 DIAGNOSIS — E211 Secondary hyperparathyroidism, not elsewhere classified: Secondary | ICD-10-CM | POA: Diagnosis not present

## 2021-03-04 DIAGNOSIS — R809 Proteinuria, unspecified: Secondary | ICD-10-CM | POA: Diagnosis not present

## 2021-03-04 DIAGNOSIS — I129 Hypertensive chronic kidney disease with stage 1 through stage 4 chronic kidney disease, or unspecified chronic kidney disease: Secondary | ICD-10-CM | POA: Diagnosis not present

## 2021-03-18 DIAGNOSIS — E782 Mixed hyperlipidemia: Secondary | ICD-10-CM | POA: Diagnosis not present

## 2021-03-18 DIAGNOSIS — I10 Essential (primary) hypertension: Secondary | ICD-10-CM | POA: Diagnosis not present

## 2021-03-22 DIAGNOSIS — I2609 Other pulmonary embolism with acute cor pulmonale: Secondary | ICD-10-CM | POA: Diagnosis not present

## 2021-03-22 DIAGNOSIS — R062 Wheezing: Secondary | ICD-10-CM | POA: Diagnosis not present

## 2021-03-22 DIAGNOSIS — M5127 Other intervertebral disc displacement, lumbosacral region: Secondary | ICD-10-CM | POA: Diagnosis not present

## 2021-03-28 DIAGNOSIS — R338 Other retention of urine: Secondary | ICD-10-CM | POA: Diagnosis not present

## 2021-04-19 DIAGNOSIS — I129 Hypertensive chronic kidney disease with stage 1 through stage 4 chronic kidney disease, or unspecified chronic kidney disease: Secondary | ICD-10-CM | POA: Diagnosis not present

## 2021-04-19 DIAGNOSIS — I10 Essential (primary) hypertension: Secondary | ICD-10-CM | POA: Diagnosis not present

## 2021-04-19 DIAGNOSIS — E785 Hyperlipidemia, unspecified: Secondary | ICD-10-CM | POA: Diagnosis not present

## 2021-04-22 DIAGNOSIS — R062 Wheezing: Secondary | ICD-10-CM | POA: Diagnosis not present

## 2021-04-22 DIAGNOSIS — I2609 Other pulmonary embolism with acute cor pulmonale: Secondary | ICD-10-CM | POA: Diagnosis not present

## 2021-04-22 DIAGNOSIS — M5127 Other intervertebral disc displacement, lumbosacral region: Secondary | ICD-10-CM | POA: Diagnosis not present

## 2021-04-27 DIAGNOSIS — R338 Other retention of urine: Secondary | ICD-10-CM | POA: Diagnosis not present

## 2021-05-05 DIAGNOSIS — E211 Secondary hyperparathyroidism, not elsewhere classified: Secondary | ICD-10-CM | POA: Diagnosis not present

## 2021-05-05 DIAGNOSIS — I129 Hypertensive chronic kidney disease with stage 1 through stage 4 chronic kidney disease, or unspecified chronic kidney disease: Secondary | ICD-10-CM | POA: Diagnosis not present

## 2021-05-05 DIAGNOSIS — R809 Proteinuria, unspecified: Secondary | ICD-10-CM | POA: Diagnosis not present

## 2021-05-05 DIAGNOSIS — N184 Chronic kidney disease, stage 4 (severe): Secondary | ICD-10-CM | POA: Diagnosis not present

## 2021-05-12 DIAGNOSIS — I129 Hypertensive chronic kidney disease with stage 1 through stage 4 chronic kidney disease, or unspecified chronic kidney disease: Secondary | ICD-10-CM | POA: Diagnosis not present

## 2021-05-12 DIAGNOSIS — E211 Secondary hyperparathyroidism, not elsewhere classified: Secondary | ICD-10-CM | POA: Diagnosis not present

## 2021-05-12 DIAGNOSIS — R809 Proteinuria, unspecified: Secondary | ICD-10-CM | POA: Diagnosis not present

## 2021-05-12 DIAGNOSIS — N184 Chronic kidney disease, stage 4 (severe): Secondary | ICD-10-CM | POA: Diagnosis not present

## 2021-05-17 DIAGNOSIS — I10 Essential (primary) hypertension: Secondary | ICD-10-CM | POA: Diagnosis not present

## 2021-05-17 DIAGNOSIS — I129 Hypertensive chronic kidney disease with stage 1 through stage 4 chronic kidney disease, or unspecified chronic kidney disease: Secondary | ICD-10-CM | POA: Diagnosis not present

## 2021-05-20 DIAGNOSIS — M5127 Other intervertebral disc displacement, lumbosacral region: Secondary | ICD-10-CM | POA: Diagnosis not present

## 2021-05-20 DIAGNOSIS — I2609 Other pulmonary embolism with acute cor pulmonale: Secondary | ICD-10-CM | POA: Diagnosis not present

## 2021-05-20 DIAGNOSIS — R062 Wheezing: Secondary | ICD-10-CM | POA: Diagnosis not present

## 2021-06-17 DIAGNOSIS — I129 Hypertensive chronic kidney disease with stage 1 through stage 4 chronic kidney disease, or unspecified chronic kidney disease: Secondary | ICD-10-CM | POA: Diagnosis not present

## 2021-06-17 DIAGNOSIS — I10 Essential (primary) hypertension: Secondary | ICD-10-CM | POA: Diagnosis not present

## 2021-06-20 DIAGNOSIS — I2609 Other pulmonary embolism with acute cor pulmonale: Secondary | ICD-10-CM | POA: Diagnosis not present

## 2021-06-20 DIAGNOSIS — R062 Wheezing: Secondary | ICD-10-CM | POA: Diagnosis not present

## 2021-06-20 DIAGNOSIS — M5127 Other intervertebral disc displacement, lumbosacral region: Secondary | ICD-10-CM | POA: Diagnosis not present

## 2021-07-04 DIAGNOSIS — E782 Mixed hyperlipidemia: Secondary | ICD-10-CM | POA: Diagnosis not present

## 2021-07-04 DIAGNOSIS — R7301 Impaired fasting glucose: Secondary | ICD-10-CM | POA: Diagnosis not present

## 2021-07-07 DIAGNOSIS — M2012 Hallux valgus (acquired), left foot: Secondary | ICD-10-CM | POA: Diagnosis not present

## 2021-07-07 DIAGNOSIS — N1832 Chronic kidney disease, stage 3b: Secondary | ICD-10-CM | POA: Diagnosis not present

## 2021-07-07 DIAGNOSIS — Z86711 Personal history of pulmonary embolism: Secondary | ICD-10-CM | POA: Diagnosis not present

## 2021-07-07 DIAGNOSIS — Z0001 Encounter for general adult medical examination with abnormal findings: Secondary | ICD-10-CM | POA: Diagnosis not present

## 2021-07-07 DIAGNOSIS — M25562 Pain in left knee: Secondary | ICD-10-CM | POA: Diagnosis not present

## 2021-07-07 DIAGNOSIS — R7301 Impaired fasting glucose: Secondary | ICD-10-CM | POA: Diagnosis not present

## 2021-07-07 DIAGNOSIS — F5101 Primary insomnia: Secondary | ICD-10-CM | POA: Diagnosis not present

## 2021-07-07 DIAGNOSIS — R600 Localized edema: Secondary | ICD-10-CM | POA: Diagnosis not present

## 2021-07-07 DIAGNOSIS — J449 Chronic obstructive pulmonary disease, unspecified: Secondary | ICD-10-CM | POA: Diagnosis not present

## 2021-07-07 DIAGNOSIS — E782 Mixed hyperlipidemia: Secondary | ICD-10-CM | POA: Diagnosis not present

## 2021-07-07 DIAGNOSIS — Z85528 Personal history of other malignant neoplasm of kidney: Secondary | ICD-10-CM | POA: Diagnosis not present

## 2021-07-07 DIAGNOSIS — I10 Essential (primary) hypertension: Secondary | ICD-10-CM | POA: Diagnosis not present

## 2021-07-17 DIAGNOSIS — N1832 Chronic kidney disease, stage 3b: Secondary | ICD-10-CM | POA: Diagnosis not present

## 2021-07-17 DIAGNOSIS — I129 Hypertensive chronic kidney disease with stage 1 through stage 4 chronic kidney disease, or unspecified chronic kidney disease: Secondary | ICD-10-CM | POA: Diagnosis not present

## 2021-07-17 DIAGNOSIS — J449 Chronic obstructive pulmonary disease, unspecified: Secondary | ICD-10-CM | POA: Diagnosis not present

## 2021-07-17 DIAGNOSIS — E782 Mixed hyperlipidemia: Secondary | ICD-10-CM | POA: Diagnosis not present

## 2021-07-20 DIAGNOSIS — I2609 Other pulmonary embolism with acute cor pulmonale: Secondary | ICD-10-CM | POA: Diagnosis not present

## 2021-07-20 DIAGNOSIS — M5127 Other intervertebral disc displacement, lumbosacral region: Secondary | ICD-10-CM | POA: Diagnosis not present

## 2021-07-20 DIAGNOSIS — R062 Wheezing: Secondary | ICD-10-CM | POA: Diagnosis not present

## 2021-07-27 DIAGNOSIS — N184 Chronic kidney disease, stage 4 (severe): Secondary | ICD-10-CM | POA: Diagnosis not present

## 2021-07-27 DIAGNOSIS — R809 Proteinuria, unspecified: Secondary | ICD-10-CM | POA: Diagnosis not present

## 2021-07-27 DIAGNOSIS — I129 Hypertensive chronic kidney disease with stage 1 through stage 4 chronic kidney disease, or unspecified chronic kidney disease: Secondary | ICD-10-CM | POA: Diagnosis not present

## 2021-07-27 DIAGNOSIS — E211 Secondary hyperparathyroidism, not elsewhere classified: Secondary | ICD-10-CM | POA: Diagnosis not present

## 2021-08-01 DIAGNOSIS — R338 Other retention of urine: Secondary | ICD-10-CM | POA: Diagnosis not present

## 2021-08-09 DIAGNOSIS — I129 Hypertensive chronic kidney disease with stage 1 through stage 4 chronic kidney disease, or unspecified chronic kidney disease: Secondary | ICD-10-CM | POA: Diagnosis not present

## 2021-08-09 DIAGNOSIS — E211 Secondary hyperparathyroidism, not elsewhere classified: Secondary | ICD-10-CM | POA: Diagnosis not present

## 2021-08-09 DIAGNOSIS — R809 Proteinuria, unspecified: Secondary | ICD-10-CM | POA: Diagnosis not present

## 2021-08-09 DIAGNOSIS — N184 Chronic kidney disease, stage 4 (severe): Secondary | ICD-10-CM | POA: Diagnosis not present

## 2021-08-10 ENCOUNTER — Other Ambulatory Visit (HOSPITAL_COMMUNITY): Payer: Self-pay | Admitting: Neurosurgery

## 2021-08-10 ENCOUNTER — Other Ambulatory Visit: Payer: Self-pay | Admitting: Neurosurgery

## 2021-08-10 DIAGNOSIS — M5416 Radiculopathy, lumbar region: Secondary | ICD-10-CM

## 2021-08-16 DIAGNOSIS — J449 Chronic obstructive pulmonary disease, unspecified: Secondary | ICD-10-CM | POA: Diagnosis not present

## 2021-08-16 DIAGNOSIS — I129 Hypertensive chronic kidney disease with stage 1 through stage 4 chronic kidney disease, or unspecified chronic kidney disease: Secondary | ICD-10-CM | POA: Diagnosis not present

## 2021-08-16 DIAGNOSIS — N1832 Chronic kidney disease, stage 3b: Secondary | ICD-10-CM | POA: Diagnosis not present

## 2021-08-16 DIAGNOSIS — E782 Mixed hyperlipidemia: Secondary | ICD-10-CM | POA: Diagnosis not present

## 2021-08-17 ENCOUNTER — Other Ambulatory Visit (HOSPITAL_COMMUNITY): Payer: Self-pay | Admitting: Internal Medicine

## 2021-08-17 ENCOUNTER — Other Ambulatory Visit: Payer: Self-pay | Admitting: Internal Medicine

## 2021-08-17 ENCOUNTER — Other Ambulatory Visit (HOSPITAL_COMMUNITY): Payer: Medicare Other

## 2021-08-17 ENCOUNTER — Ambulatory Visit (HOSPITAL_COMMUNITY)
Admission: RE | Admit: 2021-08-17 | Discharge: 2021-08-17 | Disposition: A | Discharge status: Home / Self Care | Payer: Medicare Other | Source: Ambulatory Visit | Attending: Internal Medicine | Admitting: Internal Medicine

## 2021-08-17 DIAGNOSIS — I82401 Acute embolism and thrombosis of unspecified deep veins of right lower extremity: Secondary | ICD-10-CM

## 2021-08-17 DIAGNOSIS — R600 Localized edema: Secondary | ICD-10-CM | POA: Insufficient documentation

## 2021-08-17 DIAGNOSIS — Z85528 Personal history of other malignant neoplasm of kidney: Secondary | ICD-10-CM | POA: Diagnosis not present

## 2021-08-17 DIAGNOSIS — I10 Essential (primary) hypertension: Secondary | ICD-10-CM | POA: Diagnosis not present

## 2021-08-17 DIAGNOSIS — R809 Proteinuria, unspecified: Secondary | ICD-10-CM | POA: Diagnosis not present

## 2021-08-17 DIAGNOSIS — J449 Chronic obstructive pulmonary disease, unspecified: Secondary | ICD-10-CM | POA: Diagnosis not present

## 2021-08-17 DIAGNOSIS — M5441 Lumbago with sciatica, right side: Secondary | ICD-10-CM | POA: Diagnosis not present

## 2021-08-17 DIAGNOSIS — M79671 Pain in right foot: Secondary | ICD-10-CM | POA: Diagnosis not present

## 2021-08-17 DIAGNOSIS — N1832 Chronic kidney disease, stage 3b: Secondary | ICD-10-CM | POA: Diagnosis not present

## 2021-08-17 DIAGNOSIS — M7121 Synovial cyst of popliteal space [Baker], right knee: Secondary | ICD-10-CM | POA: Diagnosis not present

## 2021-08-17 DIAGNOSIS — M2012 Hallux valgus (acquired), left foot: Secondary | ICD-10-CM | POA: Diagnosis not present

## 2021-08-17 DIAGNOSIS — M25562 Pain in left knee: Secondary | ICD-10-CM | POA: Diagnosis not present

## 2021-08-17 DIAGNOSIS — Z86711 Personal history of pulmonary embolism: Secondary | ICD-10-CM | POA: Diagnosis not present

## 2021-08-18 ENCOUNTER — Ambulatory Visit (HOSPITAL_COMMUNITY): Payer: Medicare Other

## 2021-08-20 DIAGNOSIS — M5127 Other intervertebral disc displacement, lumbosacral region: Secondary | ICD-10-CM | POA: Diagnosis not present

## 2021-08-20 DIAGNOSIS — I2609 Other pulmonary embolism with acute cor pulmonale: Secondary | ICD-10-CM | POA: Diagnosis not present

## 2021-08-20 DIAGNOSIS — R062 Wheezing: Secondary | ICD-10-CM | POA: Diagnosis not present

## 2021-08-25 ENCOUNTER — Ambulatory Visit (HOSPITAL_COMMUNITY)
Admission: RE | Admit: 2021-08-25 | Discharge: 2021-08-25 | Disposition: A | Discharge status: Home / Self Care | Payer: Medicare Other | Source: Ambulatory Visit | Attending: Neurosurgery | Admitting: Neurosurgery

## 2021-08-25 ENCOUNTER — Other Ambulatory Visit (HOSPITAL_COMMUNITY): Payer: Self-pay | Admitting: Neurosurgery

## 2021-08-25 DIAGNOSIS — M5416 Radiculopathy, lumbar region: Secondary | ICD-10-CM | POA: Insufficient documentation

## 2021-08-25 DIAGNOSIS — M4316 Spondylolisthesis, lumbar region: Secondary | ICD-10-CM | POA: Diagnosis not present

## 2021-08-25 DIAGNOSIS — M5126 Other intervertebral disc displacement, lumbar region: Secondary | ICD-10-CM | POA: Diagnosis not present

## 2021-08-25 DIAGNOSIS — M47816 Spondylosis without myelopathy or radiculopathy, lumbar region: Secondary | ICD-10-CM | POA: Diagnosis not present

## 2021-08-25 LAB — POCT I-STAT CREATININE: Creatinine, Ser: 2.2 mg/dL — ABNORMAL HIGH (ref 0.44–1.00)

## 2021-09-16 DIAGNOSIS — J449 Chronic obstructive pulmonary disease, unspecified: Secondary | ICD-10-CM | POA: Diagnosis not present

## 2021-09-16 DIAGNOSIS — E782 Mixed hyperlipidemia: Secondary | ICD-10-CM | POA: Diagnosis not present

## 2021-09-16 DIAGNOSIS — N1832 Chronic kidney disease, stage 3b: Secondary | ICD-10-CM | POA: Diagnosis not present

## 2021-09-16 DIAGNOSIS — I129 Hypertensive chronic kidney disease with stage 1 through stage 4 chronic kidney disease, or unspecified chronic kidney disease: Secondary | ICD-10-CM | POA: Diagnosis not present

## 2021-09-19 DIAGNOSIS — R062 Wheezing: Secondary | ICD-10-CM | POA: Diagnosis not present

## 2021-09-19 DIAGNOSIS — I2609 Other pulmonary embolism with acute cor pulmonale: Secondary | ICD-10-CM | POA: Diagnosis not present

## 2021-09-19 DIAGNOSIS — M5127 Other intervertebral disc displacement, lumbosacral region: Secondary | ICD-10-CM | POA: Diagnosis not present

## 2021-09-27 DIAGNOSIS — M109 Gout, unspecified: Secondary | ICD-10-CM | POA: Diagnosis not present

## 2021-09-28 DIAGNOSIS — R338 Other retention of urine: Secondary | ICD-10-CM | POA: Diagnosis not present

## 2021-10-17 DIAGNOSIS — J449 Chronic obstructive pulmonary disease, unspecified: Secondary | ICD-10-CM | POA: Diagnosis not present

## 2021-10-17 DIAGNOSIS — N1832 Chronic kidney disease, stage 3b: Secondary | ICD-10-CM | POA: Diagnosis not present

## 2021-10-17 DIAGNOSIS — E782 Mixed hyperlipidemia: Secondary | ICD-10-CM | POA: Diagnosis not present

## 2021-10-17 DIAGNOSIS — I129 Hypertensive chronic kidney disease with stage 1 through stage 4 chronic kidney disease, or unspecified chronic kidney disease: Secondary | ICD-10-CM | POA: Diagnosis not present

## 2021-10-20 DIAGNOSIS — M5127 Other intervertebral disc displacement, lumbosacral region: Secondary | ICD-10-CM | POA: Diagnosis not present

## 2021-10-20 DIAGNOSIS — I2609 Other pulmonary embolism with acute cor pulmonale: Secondary | ICD-10-CM | POA: Diagnosis not present

## 2021-10-20 DIAGNOSIS — R062 Wheezing: Secondary | ICD-10-CM | POA: Diagnosis not present

## 2021-11-02 DIAGNOSIS — G4700 Insomnia, unspecified: Secondary | ICD-10-CM | POA: Diagnosis not present

## 2021-11-02 DIAGNOSIS — M5441 Lumbago with sciatica, right side: Secondary | ICD-10-CM | POA: Diagnosis not present

## 2021-11-02 DIAGNOSIS — I10 Essential (primary) hypertension: Secondary | ICD-10-CM | POA: Diagnosis not present

## 2021-11-02 DIAGNOSIS — R600 Localized edema: Secondary | ICD-10-CM | POA: Diagnosis not present

## 2021-11-17 DIAGNOSIS — I129 Hypertensive chronic kidney disease with stage 1 through stage 4 chronic kidney disease, or unspecified chronic kidney disease: Secondary | ICD-10-CM | POA: Diagnosis not present

## 2021-11-17 DIAGNOSIS — N1832 Chronic kidney disease, stage 3b: Secondary | ICD-10-CM | POA: Diagnosis not present

## 2021-11-17 DIAGNOSIS — J449 Chronic obstructive pulmonary disease, unspecified: Secondary | ICD-10-CM | POA: Diagnosis not present

## 2021-11-17 DIAGNOSIS — E782 Mixed hyperlipidemia: Secondary | ICD-10-CM | POA: Diagnosis not present

## 2021-11-20 DIAGNOSIS — I2609 Other pulmonary embolism with acute cor pulmonale: Secondary | ICD-10-CM | POA: Diagnosis not present

## 2021-11-20 DIAGNOSIS — M5127 Other intervertebral disc displacement, lumbosacral region: Secondary | ICD-10-CM | POA: Diagnosis not present

## 2021-11-20 DIAGNOSIS — R062 Wheezing: Secondary | ICD-10-CM | POA: Diagnosis not present

## 2021-12-07 ENCOUNTER — Ambulatory Visit: Payer: Self-pay | Admitting: *Deleted

## 2021-12-07 ENCOUNTER — Encounter: Payer: Self-pay | Admitting: *Deleted

## 2021-12-07 NOTE — Patient Instructions (Signed)
Visit Information  Thank you for taking time to visit with me today. Please don't hesitate to contact me if I can be of assistance to you.   Please call the care guide team at [phone] if you need to cancel or reschedule your appointment.   If you are experiencing a Mental Health or Behavioral Health Crisis or need someone to talk to, please call the Suicide and Crisis Lifeline: 988 call the [place] National Suicide Prevention Lifeline: 1-800-273-8255 or TTY: 1-800-799-4 TTY (1-800-799-4889) to talk to a trained counselor call 1-800-273-TALK (toll free, 24 hour hotline) go to [facility] [address], [place] ([phone]) call the [place] Crisis Line: 800-939-9988 call 911  Patient verbalizes understanding of instructions and care plan provided today and agrees to view in MyChart. Active MyChart status and patient understanding of how to access instructions and care plan via MyChart confirmed with patient.     No further follow up required.  Kameron Glazebrook, BSW, MSW, LCSW  Licensed Clinical Social Worker  [facility]  Mailing Address-[address], [place], [place] [address] Physical Address-[address], [place], [place] [address] Toll Free Main # [phone] Fax # [phone] Cell # [phone] [email or web address]            

## 2021-12-07 NOTE — Patient Outreach (Signed)
  Care Coordination   Initial Visit Note   12/07/2021  Name: Lisa Lucas MRN: [id number] DOB: 1935/11/30  Lisa Lucas is a 86 y.o. year old female who sees Nevada Crane, Edwinna Areola, MD for primary care. I spoke with Lisa Lucas by phone today.  What matters to the patients health and wellness today?  No Interventions Identified.   SDOH assessments and interventions completed:  Yes.  SDOH Interventions Today    Flowsheet Row Most Recent Value  SDOH Interventions   Food Insecurity Interventions Intervention Not Indicated  Housing Interventions Intervention Not Indicated  Transportation Interventions Intervention Not Indicated  Utilities Interventions Intervention Not Indicated  Alcohol Usage Interventions Intervention Not Indicated (Score <7)  Financial Strain Interventions Intervention Not Indicated  Physical Activity Interventions Patient Refused  Stress Interventions Intervention Not Indicated  Social Connections Interventions Patient Refused       Care Coordination Interventions Activated:  Yes.    Care Coordination Interventions:  Yes, provided.    Follow up plan: No further intervention required.    Encounter Outcome:  Pt. Visit Completed.    Nat Christen, BSW, MSW, LCSW  Licensed Education officer, [facility]  Mailing [place] [address], [place], [place] [address] Physical Address-[address] [place],  [address] Toll Free Main # [phone] Fax # [phone] Cell # [phone] [email or web address]

## 2021-12-16 DIAGNOSIS — R338 Other retention of urine: Secondary | ICD-10-CM | POA: Diagnosis not present

## 2021-12-17 DIAGNOSIS — J449 Chronic obstructive pulmonary disease, unspecified: Secondary | ICD-10-CM | POA: Diagnosis not present

## 2021-12-17 DIAGNOSIS — E782 Mixed hyperlipidemia: Secondary | ICD-10-CM | POA: Diagnosis not present

## 2021-12-17 DIAGNOSIS — I129 Hypertensive chronic kidney disease with stage 1 through stage 4 chronic kidney disease, or unspecified chronic kidney disease: Secondary | ICD-10-CM | POA: Diagnosis not present

## 2021-12-17 DIAGNOSIS — N1832 Chronic kidney disease, stage 3b: Secondary | ICD-10-CM | POA: Diagnosis not present

## 2021-12-20 DIAGNOSIS — M5127 Other intervertebral disc displacement, lumbosacral region: Secondary | ICD-10-CM | POA: Diagnosis not present

## 2021-12-20 DIAGNOSIS — I2609 Other pulmonary embolism with acute cor pulmonale: Secondary | ICD-10-CM | POA: Diagnosis not present

## 2021-12-20 DIAGNOSIS — R062 Wheezing: Secondary | ICD-10-CM | POA: Diagnosis not present

## 2022-01-03 DIAGNOSIS — E782 Mixed hyperlipidemia: Secondary | ICD-10-CM | POA: Diagnosis not present

## 2022-01-03 DIAGNOSIS — R7301 Impaired fasting glucose: Secondary | ICD-10-CM | POA: Diagnosis not present

## 2022-01-10 DIAGNOSIS — Z86711 Personal history of pulmonary embolism: Secondary | ICD-10-CM | POA: Diagnosis not present

## 2022-01-10 DIAGNOSIS — N184 Chronic kidney disease, stage 4 (severe): Secondary | ICD-10-CM | POA: Diagnosis not present

## 2022-01-10 DIAGNOSIS — M25562 Pain in left knee: Secondary | ICD-10-CM | POA: Diagnosis not present

## 2022-01-10 DIAGNOSIS — I10 Essential (primary) hypertension: Secondary | ICD-10-CM | POA: Diagnosis not present

## 2022-01-10 DIAGNOSIS — R7301 Impaired fasting glucose: Secondary | ICD-10-CM | POA: Diagnosis not present

## 2022-01-10 DIAGNOSIS — E782 Mixed hyperlipidemia: Secondary | ICD-10-CM | POA: Diagnosis not present

## 2022-01-10 DIAGNOSIS — J9611 Chronic respiratory failure with hypoxia: Secondary | ICD-10-CM | POA: Diagnosis not present

## 2022-01-10 DIAGNOSIS — R600 Localized edema: Secondary | ICD-10-CM | POA: Diagnosis not present

## 2022-01-10 DIAGNOSIS — F5101 Primary insomnia: Secondary | ICD-10-CM | POA: Diagnosis not present

## 2022-01-10 DIAGNOSIS — Z85528 Personal history of other malignant neoplasm of kidney: Secondary | ICD-10-CM | POA: Diagnosis not present

## 2022-01-10 DIAGNOSIS — J449 Chronic obstructive pulmonary disease, unspecified: Secondary | ICD-10-CM | POA: Diagnosis not present

## 2022-01-10 DIAGNOSIS — Z23 Encounter for immunization: Secondary | ICD-10-CM | POA: Diagnosis not present

## 2022-01-20 DIAGNOSIS — R062 Wheezing: Secondary | ICD-10-CM | POA: Diagnosis not present

## 2022-01-20 DIAGNOSIS — I2609 Other pulmonary embolism with acute cor pulmonale: Secondary | ICD-10-CM | POA: Diagnosis not present

## 2022-01-20 DIAGNOSIS — M5127 Other intervertebral disc displacement, lumbosacral region: Secondary | ICD-10-CM | POA: Diagnosis not present

## 2022-02-01 DIAGNOSIS — R338 Other retention of urine: Secondary | ICD-10-CM | POA: Diagnosis not present

## 2022-02-14 DIAGNOSIS — I10 Essential (primary) hypertension: Secondary | ICD-10-CM | POA: Diagnosis not present

## 2022-02-14 DIAGNOSIS — R600 Localized edema: Secondary | ICD-10-CM | POA: Diagnosis not present

## 2022-02-14 DIAGNOSIS — N184 Chronic kidney disease, stage 4 (severe): Secondary | ICD-10-CM | POA: Diagnosis not present

## 2022-02-19 DIAGNOSIS — I2609 Other pulmonary embolism with acute cor pulmonale: Secondary | ICD-10-CM | POA: Diagnosis not present

## 2022-02-19 DIAGNOSIS — R062 Wheezing: Secondary | ICD-10-CM | POA: Diagnosis not present

## 2022-02-19 DIAGNOSIS — M5127 Other intervertebral disc displacement, lumbosacral region: Secondary | ICD-10-CM | POA: Diagnosis not present

## 2022-03-15 DIAGNOSIS — Z713 Dietary counseling and surveillance: Secondary | ICD-10-CM | POA: Diagnosis not present

## 2022-03-15 DIAGNOSIS — Z7182 Exercise counseling: Secondary | ICD-10-CM | POA: Diagnosis not present

## 2022-03-15 DIAGNOSIS — I10 Essential (primary) hypertension: Secondary | ICD-10-CM | POA: Diagnosis not present

## 2022-03-15 DIAGNOSIS — I129 Hypertensive chronic kidney disease with stage 1 through stage 4 chronic kidney disease, or unspecified chronic kidney disease: Secondary | ICD-10-CM | POA: Diagnosis not present

## 2022-03-15 DIAGNOSIS — R600 Localized edema: Secondary | ICD-10-CM | POA: Diagnosis not present

## 2022-03-15 DIAGNOSIS — N184 Chronic kidney disease, stage 4 (severe): Secondary | ICD-10-CM | POA: Diagnosis not present

## 2022-03-17 DIAGNOSIS — D638 Anemia in other chronic diseases classified elsewhere: Secondary | ICD-10-CM | POA: Diagnosis not present

## 2022-03-17 DIAGNOSIS — I129 Hypertensive chronic kidney disease with stage 1 through stage 4 chronic kidney disease, or unspecified chronic kidney disease: Secondary | ICD-10-CM | POA: Diagnosis not present

## 2022-03-17 DIAGNOSIS — N184 Chronic kidney disease, stage 4 (severe): Secondary | ICD-10-CM | POA: Diagnosis not present

## 2022-03-17 DIAGNOSIS — R809 Proteinuria, unspecified: Secondary | ICD-10-CM | POA: Diagnosis not present

## 2022-03-17 DIAGNOSIS — E211 Secondary hyperparathyroidism, not elsewhere classified: Secondary | ICD-10-CM | POA: Diagnosis not present

## 2022-03-17 DIAGNOSIS — E875 Hyperkalemia: Secondary | ICD-10-CM | POA: Diagnosis not present

## 2022-03-31 DIAGNOSIS — R338 Other retention of urine: Secondary | ICD-10-CM | POA: Diagnosis not present

## 2022-04-12 DIAGNOSIS — I129 Hypertensive chronic kidney disease with stage 1 through stage 4 chronic kidney disease, or unspecified chronic kidney disease: Secondary | ICD-10-CM | POA: Diagnosis not present

## 2022-04-12 DIAGNOSIS — R600 Localized edema: Secondary | ICD-10-CM | POA: Diagnosis not present

## 2022-04-12 DIAGNOSIS — N184 Chronic kidney disease, stage 4 (severe): Secondary | ICD-10-CM | POA: Diagnosis not present

## 2022-04-12 DIAGNOSIS — I10 Essential (primary) hypertension: Secondary | ICD-10-CM | POA: Diagnosis not present

## 2022-05-12 DIAGNOSIS — M5441 Lumbago with sciatica, right side: Secondary | ICD-10-CM | POA: Diagnosis not present

## 2022-05-12 DIAGNOSIS — M5442 Lumbago with sciatica, left side: Secondary | ICD-10-CM | POA: Diagnosis not present

## 2022-05-12 DIAGNOSIS — R600 Localized edema: Secondary | ICD-10-CM | POA: Diagnosis not present

## 2022-05-12 DIAGNOSIS — Z713 Dietary counseling and surveillance: Secondary | ICD-10-CM | POA: Diagnosis not present

## 2022-05-12 DIAGNOSIS — N184 Chronic kidney disease, stage 4 (severe): Secondary | ICD-10-CM | POA: Diagnosis not present

## 2022-05-12 DIAGNOSIS — I129 Hypertensive chronic kidney disease with stage 1 through stage 4 chronic kidney disease, or unspecified chronic kidney disease: Secondary | ICD-10-CM | POA: Diagnosis not present

## 2022-05-12 DIAGNOSIS — Z7182 Exercise counseling: Secondary | ICD-10-CM | POA: Diagnosis not present

## 2022-05-12 DIAGNOSIS — I10 Essential (primary) hypertension: Secondary | ICD-10-CM | POA: Diagnosis not present

## 2022-05-12 DIAGNOSIS — F5101 Primary insomnia: Secondary | ICD-10-CM | POA: Diagnosis not present

## 2022-05-18 DIAGNOSIS — M5442 Lumbago with sciatica, left side: Secondary | ICD-10-CM | POA: Diagnosis not present

## 2022-05-18 DIAGNOSIS — F5101 Primary insomnia: Secondary | ICD-10-CM | POA: Diagnosis not present

## 2022-05-18 DIAGNOSIS — M5441 Lumbago with sciatica, right side: Secondary | ICD-10-CM | POA: Diagnosis not present

## 2022-05-18 DIAGNOSIS — N390 Urinary tract infection, site not specified: Secondary | ICD-10-CM | POA: Diagnosis not present

## 2022-05-18 DIAGNOSIS — E440 Moderate protein-calorie malnutrition: Secondary | ICD-10-CM | POA: Diagnosis not present

## 2022-05-18 DIAGNOSIS — N184 Chronic kidney disease, stage 4 (severe): Secondary | ICD-10-CM | POA: Diagnosis not present

## 2022-05-18 DIAGNOSIS — I10 Essential (primary) hypertension: Secondary | ICD-10-CM | POA: Diagnosis not present

## 2022-05-18 DIAGNOSIS — Z86711 Personal history of pulmonary embolism: Secondary | ICD-10-CM | POA: Diagnosis not present

## 2022-05-18 DIAGNOSIS — R600 Localized edema: Secondary | ICD-10-CM | POA: Diagnosis not present

## 2022-05-18 DIAGNOSIS — J449 Chronic obstructive pulmonary disease, unspecified: Secondary | ICD-10-CM | POA: Diagnosis not present

## 2022-05-18 DIAGNOSIS — J9601 Acute respiratory failure with hypoxia: Secondary | ICD-10-CM | POA: Diagnosis not present

## 2022-05-18 DIAGNOSIS — I129 Hypertensive chronic kidney disease with stage 1 through stage 4 chronic kidney disease, or unspecified chronic kidney disease: Secondary | ICD-10-CM | POA: Diagnosis not present

## 2022-05-22 ENCOUNTER — Emergency Department (HOSPITAL_COMMUNITY): Payer: 59

## 2022-05-22 ENCOUNTER — Inpatient Hospital Stay (HOSPITAL_COMMUNITY)
Admission: EM | Admit: 2022-05-22 | Discharge: 2022-06-19 | DRG: 871 | Disposition: E | Payer: 59 | Attending: Pulmonary Disease | Admitting: Pulmonary Disease

## 2022-05-22 ENCOUNTER — Other Ambulatory Visit: Payer: Self-pay

## 2022-05-22 ENCOUNTER — Encounter (HOSPITAL_COMMUNITY): Payer: Self-pay | Admitting: Radiology

## 2022-05-22 DIAGNOSIS — I129 Hypertensive chronic kidney disease with stage 1 through stage 4 chronic kidney disease, or unspecified chronic kidney disease: Secondary | ICD-10-CM | POA: Diagnosis not present

## 2022-05-22 DIAGNOSIS — G9349 Other encephalopathy: Secondary | ICD-10-CM | POA: Diagnosis not present

## 2022-05-22 DIAGNOSIS — E872 Acidosis, unspecified: Secondary | ICD-10-CM | POA: Diagnosis not present

## 2022-05-22 DIAGNOSIS — D539 Nutritional anemia, unspecified: Secondary | ICD-10-CM | POA: Diagnosis present

## 2022-05-22 DIAGNOSIS — G9341 Metabolic encephalopathy: Secondary | ICD-10-CM | POA: Diagnosis not present

## 2022-05-22 DIAGNOSIS — N179 Acute kidney failure, unspecified: Secondary | ICD-10-CM | POA: Diagnosis present

## 2022-05-22 DIAGNOSIS — Z9981 Dependence on supplemental oxygen: Secondary | ICD-10-CM

## 2022-05-22 DIAGNOSIS — E861 Hypovolemia: Secondary | ICD-10-CM | POA: Diagnosis present

## 2022-05-22 DIAGNOSIS — Z86718 Personal history of other venous thrombosis and embolism: Secondary | ICD-10-CM

## 2022-05-22 DIAGNOSIS — E7800 Pure hypercholesterolemia, unspecified: Secondary | ICD-10-CM | POA: Diagnosis present

## 2022-05-22 DIAGNOSIS — N3000 Acute cystitis without hematuria: Secondary | ICD-10-CM | POA: Diagnosis not present

## 2022-05-22 DIAGNOSIS — I10 Essential (primary) hypertension: Secondary | ICD-10-CM | POA: Diagnosis not present

## 2022-05-22 DIAGNOSIS — R531 Weakness: Secondary | ICD-10-CM | POA: Diagnosis not present

## 2022-05-22 DIAGNOSIS — Z800 Family history of malignant neoplasm of digestive organs: Secondary | ICD-10-CM

## 2022-05-22 DIAGNOSIS — R001 Bradycardia, unspecified: Secondary | ICD-10-CM | POA: Diagnosis present

## 2022-05-22 DIAGNOSIS — F0390 Unspecified dementia without behavioral disturbance: Secondary | ICD-10-CM | POA: Diagnosis present

## 2022-05-22 DIAGNOSIS — N184 Chronic kidney disease, stage 4 (severe): Secondary | ICD-10-CM

## 2022-05-22 DIAGNOSIS — Z7901 Long term (current) use of anticoagulants: Secondary | ICD-10-CM

## 2022-05-22 DIAGNOSIS — J9621 Acute and chronic respiratory failure with hypoxia: Secondary | ICD-10-CM | POA: Diagnosis not present

## 2022-05-22 DIAGNOSIS — D631 Anemia in chronic kidney disease: Secondary | ICD-10-CM | POA: Diagnosis present

## 2022-05-22 DIAGNOSIS — J9811 Atelectasis: Secondary | ICD-10-CM | POA: Diagnosis not present

## 2022-05-22 DIAGNOSIS — N390 Urinary tract infection, site not specified: Secondary | ICD-10-CM | POA: Insufficient documentation

## 2022-05-22 DIAGNOSIS — R6521 Severe sepsis with septic shock: Secondary | ICD-10-CM | POA: Diagnosis not present

## 2022-05-22 DIAGNOSIS — I468 Cardiac arrest due to other underlying condition: Secondary | ICD-10-CM | POA: Diagnosis not present

## 2022-05-22 DIAGNOSIS — Z7951 Long term (current) use of inhaled steroids: Secondary | ICD-10-CM

## 2022-05-22 DIAGNOSIS — Z8701 Personal history of pneumonia (recurrent): Secondary | ICD-10-CM

## 2022-05-22 DIAGNOSIS — N323 Diverticulum of bladder: Secondary | ICD-10-CM | POA: Diagnosis not present

## 2022-05-22 DIAGNOSIS — E860 Dehydration: Secondary | ICD-10-CM | POA: Diagnosis present

## 2022-05-22 DIAGNOSIS — A419 Sepsis, unspecified organism: Secondary | ICD-10-CM | POA: Diagnosis not present

## 2022-05-22 DIAGNOSIS — J449 Chronic obstructive pulmonary disease, unspecified: Secondary | ICD-10-CM | POA: Diagnosis not present

## 2022-05-22 DIAGNOSIS — Z87891 Personal history of nicotine dependence: Secondary | ICD-10-CM

## 2022-05-22 DIAGNOSIS — Z905 Acquired absence of kidney: Secondary | ICD-10-CM

## 2022-05-22 DIAGNOSIS — I7122 Aneurysm of the aortic arch, without rupture: Secondary | ICD-10-CM | POA: Diagnosis not present

## 2022-05-22 DIAGNOSIS — I712 Thoracic aortic aneurysm, without rupture, unspecified: Secondary | ICD-10-CM

## 2022-05-22 DIAGNOSIS — J432 Centrilobular emphysema: Secondary | ICD-10-CM | POA: Diagnosis not present

## 2022-05-22 DIAGNOSIS — G8929 Other chronic pain: Secondary | ICD-10-CM | POA: Diagnosis present

## 2022-05-22 DIAGNOSIS — Z79899 Other long term (current) drug therapy: Secondary | ICD-10-CM

## 2022-05-22 DIAGNOSIS — M810 Age-related osteoporosis without current pathological fracture: Secondary | ICD-10-CM | POA: Diagnosis present

## 2022-05-22 DIAGNOSIS — Z5329 Procedure and treatment not carried out because of patient's decision for other reasons: Secondary | ICD-10-CM | POA: Diagnosis present

## 2022-05-22 DIAGNOSIS — I739 Peripheral vascular disease, unspecified: Secondary | ICD-10-CM | POA: Diagnosis not present

## 2022-05-22 DIAGNOSIS — J9622 Acute and chronic respiratory failure with hypercapnia: Secondary | ICD-10-CM | POA: Diagnosis not present

## 2022-05-22 DIAGNOSIS — Z515 Encounter for palliative care: Secondary | ICD-10-CM | POA: Diagnosis not present

## 2022-05-22 DIAGNOSIS — Z1152 Encounter for screening for COVID-19: Secondary | ICD-10-CM | POA: Diagnosis not present

## 2022-05-22 DIAGNOSIS — K5730 Diverticulosis of large intestine without perforation or abscess without bleeding: Secondary | ICD-10-CM | POA: Diagnosis not present

## 2022-05-22 DIAGNOSIS — E875 Hyperkalemia: Secondary | ICD-10-CM | POA: Diagnosis not present

## 2022-05-22 DIAGNOSIS — Z90710 Acquired absence of both cervix and uterus: Secondary | ICD-10-CM

## 2022-05-22 DIAGNOSIS — M1990 Unspecified osteoarthritis, unspecified site: Secondary | ICD-10-CM | POA: Diagnosis present

## 2022-05-22 DIAGNOSIS — Z66 Do not resuscitate: Secondary | ICD-10-CM | POA: Diagnosis present

## 2022-05-22 DIAGNOSIS — Z86711 Personal history of pulmonary embolism: Secondary | ICD-10-CM

## 2022-05-22 DIAGNOSIS — I714 Abdominal aortic aneurysm, without rupture, unspecified: Secondary | ICD-10-CM | POA: Diagnosis not present

## 2022-05-22 DIAGNOSIS — Z85528 Personal history of other malignant neoplasm of kidney: Secondary | ICD-10-CM

## 2022-05-22 LAB — POTASSIUM: Potassium: 5.5 mmol/L — ABNORMAL HIGH (ref 3.5–5.1)

## 2022-05-22 LAB — CBC
HCT: 35.1 % — ABNORMAL LOW (ref 36.0–46.0)
Hemoglobin: 10.6 g/dL — ABNORMAL LOW (ref 12.0–15.0)
MCH: 29.6 pg (ref 26.0–34.0)
MCHC: 30.2 g/dL (ref 30.0–36.0)
MCV: 98 fL (ref 80.0–100.0)
Platelets: 157 10*3/uL (ref 150–400)
RBC: 3.58 MIL/uL — ABNORMAL LOW (ref 3.87–5.11)
RDW: 14.8 % (ref 11.5–15.5)
WBC: 6.5 10*3/uL (ref 4.0–10.5)
nRBC: 0 % (ref 0.0–0.2)

## 2022-05-22 LAB — URINALYSIS, ROUTINE W REFLEX MICROSCOPIC
Bilirubin Urine: NEGATIVE
Glucose, UA: NEGATIVE mg/dL
Ketones, ur: 5 mg/dL — AB
Nitrite: NEGATIVE
Protein, ur: >=300 mg/dL — AB
RBC / HPF: >50 RBC/hpf (ref 0–5)
Specific Gravity, Urine: 1.011 (ref 1.005–1.030)
WBC, UA: >50 WBC/hpf (ref 0–5)
pH: 8 (ref 5.0–8.0)

## 2022-05-22 LAB — RESP PANEL BY RT-PCR (RSV, FLU A&B, COVID)  RVPGX2
Influenza A by PCR: NEGATIVE
Influenza B by PCR: NEGATIVE
Resp Syncytial Virus by PCR: NEGATIVE
SARS Coronavirus 2 by RT PCR: NEGATIVE

## 2022-05-22 LAB — BASIC METABOLIC PANEL WITH GFR
Anion gap: 13 (ref 5–15)
BUN: 133 mg/dL — ABNORMAL HIGH (ref 8–23)
CO2: 16 mmol/L — ABNORMAL LOW (ref 22–32)
Calcium: 8.9 mg/dL (ref 8.9–10.3)
Chloride: 105 mmol/L (ref 98–111)
Creatinine, Ser: 5.24 mg/dL — ABNORMAL HIGH (ref 0.44–1.00)
GFR, Estimated: 8 mL/min — ABNORMAL LOW
Glucose, Bld: 108 mg/dL — ABNORMAL HIGH (ref 70–99)
Potassium: 5.4 mmol/L — ABNORMAL HIGH (ref 3.5–5.1)
Sodium: 134 mmol/L — ABNORMAL LOW (ref 135–145)

## 2022-05-22 LAB — HEPATIC FUNCTION PANEL
ALT: 8 U/L (ref 0–44)
AST: 22 U/L (ref 15–41)
Albumin: 3.6 g/dL (ref 3.5–5.0)
Alkaline Phosphatase: 73 U/L (ref 38–126)
Bilirubin, Direct: 0.3 mg/dL — ABNORMAL HIGH (ref 0.0–0.2)
Indirect Bilirubin: 0.5 mg/dL (ref 0.3–0.9)
Total Bilirubin: 0.8 mg/dL (ref 0.3–1.2)
Total Protein: 7 g/dL (ref 6.5–8.1)

## 2022-05-22 LAB — LACTIC ACID, PLASMA
Lactic Acid, Venous: 1.3 mmol/L (ref 0.5–1.9)
Lactic Acid, Venous: 1 mmol/L (ref 0.5–1.9)

## 2022-05-22 LAB — LIPASE, BLOOD: Lipase: 47 U/L (ref 11–51)

## 2022-05-22 LAB — TROPONIN I (HIGH SENSITIVITY)
Troponin I (High Sensitivity): 7 ng/L (ref <18)
Troponin I (High Sensitivity): 6 ng/L (ref <18)

## 2022-05-22 LAB — CBG MONITORING, ED: Glucose-Capillary: 94 mg/dL (ref 70–99)

## 2022-05-22 MED ORDER — SODIUM CHLORIDE 0.9 % IV SOLN
1.0000 g | Freq: Once | INTRAVENOUS | Status: AC
  Administered 2022-05-22: 1 g via INTRAVENOUS
  Filled 2022-05-22: qty 10

## 2022-05-22 MED ORDER — SODIUM CHLORIDE 0.9 % IV BOLUS
500.0000 mL | Freq: Once | INTRAVENOUS | Status: AC
  Administered 2022-05-22: 500 mL via INTRAVENOUS

## 2022-05-22 MED ORDER — IOHEXOL 300 MG/ML  SOLN
100.0000 mL | Freq: Once | INTRAMUSCULAR | Status: AC | PRN
  Administered 2022-05-22: 100 mL via INTRAVENOUS

## 2022-05-22 MED ORDER — ONDANSETRON HCL 4 MG/2ML IJ SOLN
4.0000 mg | Freq: Once | INTRAMUSCULAR | Status: AC
  Administered 2022-05-22: 4 mg via INTRAVENOUS
  Filled 2022-05-22: qty 2

## 2022-05-22 MED ORDER — SODIUM CHLORIDE 0.9 % IV SOLN: Freq: Once | INTRAVENOUS | Status: AC

## 2022-05-22 NOTE — ED Provider Notes (Signed)
[facility] Provider Note   CSN: [id number] Arrival date & time: 06/07/2022  1521     History {Add pertinent medical, surgical, social history, OB history to HPI:1} Chief Complaint  Patient presents with   Abnormal labs   Weakness    Lisa Lucas is a 87 y.o. female.  Is brought in by her daughter for general weakness.  She has a history of kidney cancer and chronic back pain.  DVT on blood thinners.  Daughter states about a week or so ago she has been more sleepy lethargic not eating or drinking.  Saw her PCP and he started her on Cymbalta.  Went back to the PCP a few days later and he thought she might have a UTI so put her on Macrobid.  She is seeing no improvement in her activity.  She continues to endorse chronic back issues but does not seem worse.  No fevers or chills.  She has had some nausea vomiting diarrhea.  No urinary symptoms.  No cough.  She has not been able to ambulate over the last 2 days.  Normally she is ambulatory without any difficulty cooks feeds herself balances her checkbook, drives.  The history is provided by the patient and a relative.  Weakness Severity:  Severe Onset quality:  Gradual Duration:  1 week Timing:  Constant Progression:  Worsening Chronicity:  New Context: urinary tract infection   Relieved by:  Nothing Worsened by:  Activity Ineffective treatments:  Rest and medication Associated symptoms: diarrhea, difficulty walking, nausea and vomiting   Associated symptoms: no abdominal pain, no chest pain, no cough, no dysuria, no fever and no shortness of breath        Home Medications Prior to Admission medications   Medication Sig Start Date End Date Taking? Authorizing Provider  acetaminophen (TYLENOL) 500 MG tablet Take 1,000 mg by mouth every 6 (six) hours as needed for moderate pain.    [provider]  albuterol (PROVENTIL) (5 MG/ML) 0.5% nebulizer solution Take 0.5 mLs (2.5 mg  total) by nebulization every 2 (two) hours as needed for wheezing or shortness of breath. 04/20/12   Kristeen Miss, MD  amLODipine (NORVASC) 5 MG tablet Take 5 mg by mouth daily.    [provider]  apixaban (ELIQUIS) 5 MG TABS tablet Take 2 tablets (10 mg total) by mouth 2 (two) times daily. 07/10/16   Laqueta Linden, MD  apixaban (ELIQUIS) 5 MG TABS tablet Take 1 tablet (5 mg total) by mouth 2 (two) times daily. 07/17/16   Laqueta Linden, MD  Calcium Carb-Cholecalciferol (CALCIUM 600/VITAMIN D3) 600-800 MG-UNIT TABS Take 1 tablet by mouth every morning.     [provider]  cyclobenzaprine (FLEXERIL) 5 MG tablet Take 1 tablet (5 mg total) by mouth 3 (three) times daily as needed for muscle spasms. 01/10/15   Pixie Casino, MD      Allergies    Hydrocodone, Morphine and related, and Oxycodone    Review of Systems   Review of Systems  Constitutional:  Negative for fever.  Respiratory:  Negative for cough and shortness of breath.   Cardiovascular:  Negative for chest pain.  Gastrointestinal:  Positive for diarrhea, nausea and vomiting. Negative for abdominal pain.  Genitourinary:  Negative for dysuria.  Musculoskeletal:  Positive for back pain.  Neurological:  Positive for weakness.    Physical Exam Updated Vital Signs BP (!) 109/52 (BP Location: Right Arm)   Pulse Marland Kitchen)  58   Temp 97.8 F (36.6 C) (Oral)   Resp 16   Ht '5\' 5"'$  (1.651 m)   Wt 76.7 kg   SpO2 92%   BMI 28.12 kg/m  Physical Exam Vitals and nursing note reviewed.  Constitutional:      General: She is not in acute distress.    Appearance: Normal appearance. She is well-developed.     Comments: She is tired appearing but will answer some simple questions.  She knows its Monday and March.  Moving all extremities without any focal deficits.  HENT:     Head: Normocephalic and atraumatic.  Eyes:     Conjunctiva/sclera: Conjunctivae normal.  Cardiovascular:     Rate and Rhythm: Normal rate and  regular rhythm.     Heart sounds: No murmur heard. Pulmonary:     Effort: Pulmonary effort is normal. No respiratory distress.     Breath sounds: Normal breath sounds.  Abdominal:     Palpations: Abdomen is soft.     Tenderness: There is no abdominal tenderness. There is no guarding or rebound.  Musculoskeletal:        General: No deformity. Normal range of motion.     Cervical back: Neck supple.     Right lower leg: No edema.     Left lower leg: No edema.  Skin:    General: Skin is warm and dry.     Capillary Refill: Capillary refill takes less than 2 seconds.  Neurological:     General: No focal deficit present.     ED Results / Procedures / Treatments   Labs (all labs ordered are listed, but only abnormal results are displayed) Labs Reviewed  CBC - Abnormal; Notable for the following components:      Result Value   RBC 3.58 (*)    Hemoglobin 10.6 (*)    HCT 35.1 (*)    All other components within normal limits  BASIC METABOLIC PANEL  URINALYSIS, ROUTINE W REFLEX MICROSCOPIC  CBG MONITORING, ED    EKG EKG Interpretation  Date/Time:  Monday May 22 2022 15:36:44 EST Ventricular Rate:  54 PR Interval:  146 QRS Duration: 76 QT Interval:  412 QTC Calculation: 390 R Axis:   49 Text Interpretation: Sinus bradycardia Otherwise normal ECG When compared with ECG of 09-Jul-2016 15:00, rate is slower Confirmed by Aletta Edouard [id number]) on 06/06/2022 3:42:41 PM  Radiology No results found.  Procedures Procedures  {Document cardiac monitor, telemetry assessment procedure when appropriate:1}  Medications Ordered in ED Medications  sodium chloride 0.9 % bolus 500 mL (has no administration in time range)  ondansetron (ZOFRAN) injection 4 mg (has no administration in time range)    ED Course/ Medical Decision Making/ A&P   {   Click here for ABCD2, HEART and other calculatorsREFRESH Note before signing :1}                          Medical Decision Making Amount  and/or Complexity of Data Reviewed Labs: ordered. Radiology: ordered.  Risk Prescription drug management.   This patient complains of ***; this involves an extensive number of treatment Options and is a complaint that carries with it a high risk of complications and morbidity. The differential includes ***  I ordered, reviewed and interpreted labs, which included *** I ordered medication *** and reviewed PMP when indicated. I ordered imaging studies which included *** and I independently    visualized and interpreted imaging which showed ***  Additional history obtained from *** Previous records obtained and reviewed *** I consulted *** and discussed lab and imaging findings and discussed disposition.  Cardiac monitoring reviewed, *** Social determinants considered, *** Critical Interventions: ***  After the interventions stated above, I reevaluated the patient and found *** Admission and further testing considered, ***   {Document critical care time when appropriate:1} {Document review of labs and clinical decision tools ie heart score, Chads2Vasc2 etc:1}  {Document your independent review of radiology images, and any outside records:1} {Document your discussion with family members, caretakers, and with consultants:1} {Document social determinants of health affecting pt's care:1} {Document your decision making why or why not admission, treatments were needed:1} Final Clinical Impression(s) / ED Diagnoses Final diagnoses:  None    Rx / DC Orders ED Discharge Orders     None

## 2022-05-22 NOTE — ED Notes (Signed)
Patient transported to CT 

## 2022-05-22 NOTE — Assessment & Plan Note (Signed)
-   Continue hydralazine and Coreg - Holding telmisartan and chlorthalidone in the setting of AKI

## 2022-05-22 NOTE — Progress Notes (Signed)
[place] report given to receiving nurse on 5N at [facility]. [facility] here to transfer patient. Patient is in stable condition.

## 2022-05-22 NOTE — Assessment & Plan Note (Signed)
-   Secondary to UTI and dehydration - PT eval and treat - No focal deficits - Continue to monitor

## 2022-05-22 NOTE — Assessment & Plan Note (Signed)
-   Continue Eliquis - Patient has history of pulmonary emboli and DVT

## 2022-05-22 NOTE — Assessment & Plan Note (Signed)
-   Described as fatigue and somnolence - Somnolent but oriented x 4 - Most likely secondary to UTI and AKI - BUN is elevated at 133 - CT scan shows no hydronephrosis simple cyst of the left kidney, proteinaceous cyst of the right kidney - No focal deficits - Negative COVID, flu, RSV - Maintaining oxygen sats on chronic O2 supplementation 2 L nasal cannula - Continue to monitor

## 2022-05-22 NOTE — Assessment & Plan Note (Signed)
-   Continue albuterol as needed and Trelegy - Continue chronic oxygen supplementation at 2 L nasal cannula

## 2022-05-22 NOTE — Assessment & Plan Note (Signed)
-   Rocephin started in the ED - Continue Rocephin - Add on urine culture to previously collected urine - Suprapubic pain and dysuria for 4-5 days - Continue to monitor

## 2022-05-22 NOTE — Assessment & Plan Note (Addendum)
-   Creatinine increased from 2.2 in June 2023 >> 5.24 - Secondary to dehydration - BUN to creatinine ratio is 133: 5.24 - 500 mL bolus given in the ED - Continue IV fluids with normal saline - Hold nephrotoxic agents including chlorthalidone and telmisartan - Trend labs in the a.m.

## 2022-05-22 NOTE — H&P (Signed)
History and Physical    Patient: Lisa Lucas [id number] DOB: 10-29-1935 DOA: 06/06/2022 DOS: the patient was seen and examined on 06/11/2022 PCP: Celene Squibb, MD  Patient coming from: Home  Chief Complaint:  Chief Complaint  Patient presents with   Abnormal labs   Weakness   HPI: Lisa Lucas is a 87 y.o. female with medical history significant of COPD, DVT, pulmonary embolism, hyperlipidemia, hypertension, peripheral vascular disease, renal insufficiency, and more presents the ED with a chief complaint of altered mental status.  Patient reports that she just been overly fatigued and sleeping all the time.  She reports no pain.  She does report dysuria that started 4 or 5 days ago.  She denies any fever, cough, dyspnea.  Patient also complains of no appetite.  She reports she has not eaten a meal in about a week.  She has not had good p.o. liquid intake either.  Patient reports dark urine, which is confirmed with canister from pure wick.  She has complained about her fatigue to her PCP and started her on Cymbalta, but also a few days later started her on Macrobid.  These records are not available within epic.  Patient has no other complaints at this time.  Patient does not smoke, does not drink.  She is vaccinated for COVID and flu.  Patient reports that if something were to happen where her heart stopped beating she would prefer to have a natural passing. Review of Systems: As mentioned in the history of present illness. All other systems reviewed and are negative. Past Medical History:  Diagnosis Date   Arthritis    Gehl's cyst    Cancer ([facility])    kidney bil    COPD (chronic obstructive pulmonary disease) (HCC)    3L intermittent home O2   DVT (deep venous thrombosis) ([place]) 12/2010   right lower extremity and PE but also had prior DVT and had been on coumadin but converted to plavix/asa due to "reaction" until she presented with another DVT/PE in 12/2010.;  LLE DVT 02/2012    History of kidney cancer    Hypercholesteremia    Hypertension    Osteoporosis    Peripheral vascular disease ([place])    Pneumonia    recent  1 month ago   PONV (postoperative nausea and vomiting)    has difficulty breathing coming out of anesthesia,   Pulmonary embolism ([place]) 12/2010   Renal insufficiency    Spinal stenosis    Past Surgical History:  Procedure Laterality Date   ABDOMINAL HYSTERECTOMY     APPENDECTOMY     BACK SURGERY     COLONOSCOPY  12/29/2011   Procedure: COLONOSCOPY;  Surgeon: Danie Binder, MD;  Location: [facility];  Service: Endoscopy;  Laterality: N/A;  11:30   HERNIA REPAIR     KIDNEY SURGERY  2010   bilateral renal cancer s/p bilateral partial nephrectomy   LEG SURGERY     right leg for blood clot   LUMBAR LAMINECTOMY  04/16/2012   Dr Joya Salm   LUMBAR LAMINECTOMY/DECOMPRESSION MICRODISCECTOMY  01/30/2012   Procedure: LUMBAR LAMINECTOMY/DECOMPRESSION MICRODISCECTOMY 1 LEVEL;  Surgeon: Floyce Stakes, MD;  Location: [facility];  Service: Neurosurgery;  Laterality: Left;  Left Lumbar Four-Five Diskectomy   LUMBAR LAMINECTOMY/DECOMPRESSION MICRODISCECTOMY  04/16/2012   Procedure: LUMBAR LAMINECTOMY/DECOMPRESSION MICRODISCECTOMY 1 LEVEL;  Surgeon: Floyce Stakes, MD;  Location: [facility];  Service: Neurosurgery;  Laterality: Left;  Left Lumbar four-five Redo Diskectomy  Social History:  reports that she quit smoking about 11 years ago. Her smoking use included cigarettes. She has a 15.00 pack-year smoking history. She has been exposed to tobacco smoke. She has never used smokeless tobacco. She reports that she does not drink alcohol and does not use drugs.  Allergies  Allergen Reactions   Hydrocodone Nausea Only   Morphine And Related Nausea Only   Oxycodone Nausea Only    Family History  Problem Relation Age of Onset   Colon cancer Daughter        age 32s   Colon cancer Brother        age 28    Prior to Admission medications    Medication Sig Start Date End Date Taking? Authorizing Provider  acetaminophen (TYLENOL) 500 MG tablet Take 1,000 mg by mouth every 6 (six) hours as needed for moderate pain.   Yes [provider]  albuterol (PROVENTIL) (5 MG/ML) 0.5% nebulizer solution Take 0.5 mLs (2.5 mg total) by nebulization every 2 (two) hours as needed for wheezing or shortness of breath. 04/20/12  Yes Kristeen Miss, MD  allopurinol (ZYLOPRIM) 100 MG tablet Take 50 mg by mouth every other day.   Yes [provider]  apixaban (ELIQUIS) 5 MG TABS tablet Take 2 tablets (10 mg total) by mouth 2 (two) times daily. 07/10/16  Yes Laqueta Linden, MD  calcitRIOL (ROCALTROL) 0.25 MCG capsule Take 0.25 mcg by mouth 3 (three) times a week.   Yes [provider]  Calcium Carb-Cholecalciferol (CALCIUM 600/VITAMIN D3) 600-800 MG-UNIT TABS Take 1 tablet by mouth every morning.    Yes [provider]  carvedilol (COREG) 6.25 MG tablet Take 6.25 mg by mouth 2 (two) times daily with a meal. 09/04/16  Yes [provider]  chlorthalidone (HYGROTON) 25 MG tablet Take 12.5 mg by mouth daily.   Yes [provider]  DULoxetine (CYMBALTA) 30 MG capsule Take 30 mg by mouth daily. 05/12/22  Yes [provider]  hydrALAZINE (APRESOLINE) 50 MG tablet Take 50 mg by mouth 3 (three) times daily. 03/17/22 03/17/23 Yes [provider]  levocetirizine (XYZAL) 5 MG tablet Take 5 mg by mouth at bedtime as needed.   Yes [provider]  pregabalin (LYRICA) 50 MG capsule Take 50 mg by mouth 2 (two) times daily.   Yes [provider]  sodium zirconium cyclosilicate (LOKELMA) 5 g packet Take by mouth.   Yes [provider]  telmisartan (MICARDIS) 20 MG tablet Take 20 mg by mouth daily.   Yes [provider]  TRELEGY ELLIPTA 100-62.5-25 MCG/ACT AEPB Inhale 1 puff into the lungs daily. 09/26/18  Yes [provider]    Physical Exam: Vitals:    06/13/2022 1529 05/31/2022 1532 06/06/2022 1630 06/16/2022 2035  BP:  (!) 109/52 (!) 110/53 (!) 100/48  Pulse:  (!) 58 (!) 51 (!) 50  Resp:  '16 13 13  '$ Temp:  97.8 F (36.6 C)  97.6 F (36.4 C)  TempSrc:  Oral  Oral  SpO2:  92% 91% 100%  Weight: 76.7 kg     Height: '5\' 5"'$  (1.651 m)      1.  General: Patient lying supine in bed,  no acute distress   2. Psychiatric: Alert and oriented x 3, mood and behavior normal for situation, pleasant and cooperative with exam   3. Neurologic: Speech and language are normal, face is symmetric, moves all 4 extremities voluntarily, at baseline without acute deficits on limited exam  4. HEENMT:  Head is atraumatic, normocephalic, pupils reactive to light, neck is supple, trachea is midline, mucous membranes are mildly dry   5. Respiratory : Lungs are clear to auscultation bilaterally without wheezing, rhonchi, rales, no cyanosis, no increase in work of breathing or accessory muscle use   6. Cardiovascular : Heart rate bradycardic, rhythm is regular, no rubs or gallops, no peripheral edema, peripheral pulses palpated   7. Gastrointestinal:  Abdomen is soft, nondistended, exquisite redness in the suprapubic region, bowel sounds active, no masses or organomegaly palpated   8. Skin:  Skin is warm, dry and intact without rashes, acute lesions, or ulcers on limited exam   9.Musculoskeletal:  No acute deformities or trauma, no asymmetry in tone, no peripheral edema, peripheral pulses palpated, no tenderness to palpation in the extremities  Data Reviewed: In the ED Temp 97.8, heart rate 51-58, respiratory rate 13-16, blood pressure 109/52-110/53, satting 90s on chronic 2 L nasal cannula No leukocytosis with a white blood cell count of 6.5, hemoglobin 10.6, platelets 157 Chemistry reveals a slight hyperkalemia at 5.4  Trope 7, 6  Negative COVID, flu, RSV  Lactic acid 1.0  Lipase 47  UA is indicative of UTI Chest x-ray shows a 2.4 x 2 cm lesion CT chest  was done that showed a large 2.3 centimeter focal outpouching of the left aortic arch that could be a pseudoaneurysm or penetrating atherosclerotic ulcer Vascular surgery was consulted recommends admission to [facility] where they can consult in person Not likely to do surgery given her age and comorbidities CT abdomen pelvis also shows focal cystitis Patient was started on Rocephin, given Zofran, given NS 500 in the ED Admission requested for acute metabolic encephalopathy, generalized weakness, UTI/AKI  Assessment and Plan: * Acute metabolic encephalopathy - Described as fatigue and somnolence - Somnolent but oriented x 4 - Most likely secondary to UTI and AKI - BUN is elevated at 133 - CT scan shows no hydronephrosis simple cyst of the left kidney, proteinaceous cyst of the right kidney - No focal deficits - Negative COVID, flu, RSV - Maintaining oxygen sats on chronic O2 supplementation 2 L nasal cannula - Continue to monitor  Hyperkalemia - 5.4 - Likely secondary to AKI/dehydration - 500 mL bolus given in the ED - Continue IV fluids throughout the night - Recheck potassium now  COPD (chronic obstructive pulmonary disease) (HCC) - Continue albuterol as needed and Trelegy - Continue chronic oxygen supplementation at 2 L nasal cannula  Generalized weakness - Secondary to UTI and dehydration - PT eval and treat - No focal deficits - Continue to monitor  UTI (urinary tract infection) - Rocephin started in the ED - Continue Rocephin - Add on urine culture to previously collected urine - Suprapubic pain and dysuria for 4-5 days - Continue to monitor  Chronic anticoagulation - Continue Eliquis - Patient has history of pulmonary emboli and DVT  HTN (hypertension) - Continue hydralazine and Coreg - Holding telmisartan and chlorthalidone in the setting of AKI  AKI (acute kidney injury) ([place]) - Creatinine increased from 2.2 in June 2023 >> 5.24 - Secondary to  dehydration - BUN to creatinine ratio is 133: 5.24 - 500 mL bolus given in the ED - Continue IV fluids with normal saline - Hold nephrotoxic agents including chlorthalidone and telmisartan - Trend labs in the a.m.      Advance Care Planning:   Code Status: DNR  Consults: Vascular surgery will need to be consulted upon arrival at [facility]  Family Communication: No family at bedside  Severity of Illness: The appropriate patient status for this patient is INPATIENT. Inpatient status is judged to be reasonable and necessary in order to provide the required intensity of service to ensure the patient's safety. The patient's presenting symptoms, physical exam findings, and initial radiographic and laboratory data in the context of their chronic comorbidities is felt to place them at high risk for further clinical deterioration. Furthermore, it is not anticipated that the patient will be medically stable for discharge from the hospital within 2 midnights of admission.   * I certify that at the point of admission it is my clinical judgment that the patient will require inpatient hospital care spanning beyond 2 midnights from the point of admission due to high intensity of service, high risk for further deterioration and high frequency of surveillance required.*  Author: Rolla Plate, DO  10:34 PM  For on call review [email or web address].

## 2022-05-22 NOTE — ED Triage Notes (Signed)
Pt states she has been feeling bad for the past 2-3 weeks but has gotten worse the past couple of days. Pt states her stomach hurts and has pain in her left arm and right leg. Hurts when she urinates. Pt daughter in at this time and states that Thursday the patient was started on Macrobid for UTI. Pt doc called and notified them to bring her to the ED due to decreasing kidney function as well as dehydration.  Family states she hasn't eaten a full meal since 2-23. States vomites every time she tries to eat or drink anything.

## 2022-05-22 NOTE — Assessment & Plan Note (Signed)
-   5.4 - Likely secondary to AKI/dehydration - 500 mL bolus given in the ED - Continue IV fluids throughout the night - Recheck potassium now

## 2022-05-23 ENCOUNTER — Encounter (HOSPITAL_COMMUNITY): Payer: Self-pay | Admitting: Family Medicine

## 2022-05-23 ENCOUNTER — Inpatient Hospital Stay: Payer: Self-pay

## 2022-05-23 DIAGNOSIS — A419 Sepsis, unspecified organism: Secondary | ICD-10-CM | POA: Diagnosis not present

## 2022-05-23 DIAGNOSIS — G9341 Metabolic encephalopathy: Secondary | ICD-10-CM | POA: Diagnosis not present

## 2022-05-23 DIAGNOSIS — R6521 Severe sepsis with septic shock: Secondary | ICD-10-CM

## 2022-05-23 DIAGNOSIS — I714 Abdominal aortic aneurysm, without rupture, unspecified: Secondary | ICD-10-CM | POA: Diagnosis not present

## 2022-05-23 DIAGNOSIS — N179 Acute kidney failure, unspecified: Secondary | ICD-10-CM | POA: Diagnosis not present

## 2022-05-23 DIAGNOSIS — N184 Chronic kidney disease, stage 4 (severe): Secondary | ICD-10-CM

## 2022-05-23 LAB — CBC WITH DIFFERENTIAL/PLATELET
Abs Immature Granulocytes: 0.04 10*3/uL (ref 0.00–0.07)
Basophils Absolute: 0 10*3/uL (ref 0.0–0.1)
Basophils Relative: 1 %
Eosinophils Absolute: 0.1 10*3/uL (ref 0.0–0.5)
Eosinophils Relative: 1 %
HCT: 31.4 % — ABNORMAL LOW (ref 36.0–46.0)
Hemoglobin: 9.7 g/dL — ABNORMAL LOW (ref 12.0–15.0)
Immature Granulocytes: 1 %
Lymphocytes Relative: 9 %
Lymphs Abs: 0.6 10*3/uL — ABNORMAL LOW (ref 0.7–4.0)
MCH: 30.2 pg (ref 26.0–34.0)
MCHC: 30.9 g/dL (ref 30.0–36.0)
MCV: 97.8 fL (ref 80.0–100.0)
Monocytes Absolute: 0.6 10*3/uL (ref 0.1–1.0)
Monocytes Relative: 9 %
Neutro Abs: 5.3 10*3/uL (ref 1.7–7.7)
Neutrophils Relative %: 79 %
Platelets: 138 10*3/uL — ABNORMAL LOW (ref 150–400)
RBC: 3.21 MIL/uL — ABNORMAL LOW (ref 3.87–5.11)
RDW: 14.8 % (ref 11.5–15.5)
WBC: 6.6 10*3/uL (ref 4.0–10.5)
nRBC: 0 % (ref 0.0–0.2)

## 2022-05-23 LAB — COMPREHENSIVE METABOLIC PANEL
ALT: 8 U/L (ref 0–44)
AST: 18 U/L (ref 15–41)
Albumin: 2.7 g/dL — ABNORMAL LOW (ref 3.5–5.0)
Alkaline Phosphatase: 60 U/L (ref 38–126)
Anion gap: 9 (ref 5–15)
BUN: 136 mg/dL — ABNORMAL HIGH (ref 8–23)
CO2: 17 mmol/L — ABNORMAL LOW (ref 22–32)
Calcium: 8.5 mg/dL — ABNORMAL LOW (ref 8.9–10.3)
Chloride: 109 mmol/L (ref 98–111)
Creatinine, Ser: 5.31 mg/dL — ABNORMAL HIGH (ref 0.44–1.00)
GFR, Estimated: 7 mL/min — ABNORMAL LOW (ref >60)
Glucose, Bld: 144 mg/dL — ABNORMAL HIGH (ref 70–99)
Potassium: 5.1 mmol/L (ref 3.5–5.1)
Sodium: 135 mmol/L (ref 135–145)
Total Bilirubin: 0.5 mg/dL (ref 0.3–1.2)
Total Protein: 5.7 g/dL — ABNORMAL LOW (ref 6.5–8.1)

## 2022-05-23 LAB — BASIC METABOLIC PANEL
Anion gap: 11 (ref 5–15)
BUN: 125 mg/dL — ABNORMAL HIGH (ref 8–23)
CO2: 13 mmol/L — ABNORMAL LOW (ref 22–32)
Calcium: 8.1 mg/dL — ABNORMAL LOW (ref 8.9–10.3)
Chloride: 114 mmol/L — ABNORMAL HIGH (ref 98–111)
Creatinine, Ser: 5.04 mg/dL — ABNORMAL HIGH (ref 0.44–1.00)
GFR, Estimated: 8 mL/min — ABNORMAL LOW (ref >60)
Glucose, Bld: 161 mg/dL — ABNORMAL HIGH (ref 70–99)
Potassium: 5.3 mmol/L — ABNORMAL HIGH (ref 3.5–5.1)
Sodium: 138 mmol/L (ref 135–145)

## 2022-05-23 LAB — MAGNESIUM: Magnesium: 2.3 mg/dL (ref 1.7–2.4)

## 2022-05-23 LAB — GLUCOSE, CAPILLARY
Glucose-Capillary: 129 mg/dL — ABNORMAL HIGH (ref 70–99)
Glucose-Capillary: 165 mg/dL — ABNORMAL HIGH (ref 70–99)

## 2022-05-23 LAB — MRSA NEXT GEN BY PCR, NASAL: MRSA by PCR Next Gen: NOT DETECTED

## 2022-05-23 MED ORDER — PREGABALIN 25 MG PO CAPS
50.0000 mg | ORAL_CAPSULE | Freq: Two times a day (BID) | ORAL | Status: DC
  Administered 2022-05-23 (×2): 50 mg via ORAL
  Filled 2022-05-23 (×2): qty 2

## 2022-05-23 MED ORDER — NOREPINEPHRINE 4 MG/250ML-% IV SOLN
2.0000 ug/min | INTRAVENOUS | Status: DC
  Administered 2022-05-23 – 2022-05-24 (×2): 2 ug/min via INTRAVENOUS
  Filled 2022-05-23 (×2): qty 250

## 2022-05-23 MED ORDER — FLUTICASONE FUROATE-VILANTEROL 100-25 MCG/ACT IN AEPB: 1.0000 | INHALATION_SPRAY | Freq: Every day | RESPIRATORY_TRACT | Status: DC

## 2022-05-23 MED ORDER — ONDANSETRON HCL 4 MG PO TABS: 4.0000 mg | ORAL_TABLET | Freq: Four times a day (QID) | ORAL | Status: DC | PRN

## 2022-05-23 MED ORDER — VANCOMYCIN VARIABLE DOSE PER UNSTABLE RENAL FUNCTION (PHARMACIST DOSING): Status: DC

## 2022-05-23 MED ORDER — REVEFENACIN 175 MCG/3ML IN SOLN
175.0000 ug | Freq: Every day | RESPIRATORY_TRACT | Status: DC
  Filled 2022-05-23: qty 3

## 2022-05-23 MED ORDER — VANCOMYCIN HCL 1250 MG/250ML IV SOLN
1250.0000 mg | Freq: Once | INTRAVENOUS | Status: AC
  Administered 2022-05-23: 1250 mg via INTRAVENOUS
  Filled 2022-05-23: qty 250

## 2022-05-23 MED ORDER — UMECLIDINIUM BROMIDE 62.5 MCG/ACT IN AEPB
1.0000 | INHALATION_SPRAY | Freq: Every day | RESPIRATORY_TRACT | Status: DC
  Administered 2022-05-23: 1 via RESPIRATORY_TRACT
  Filled 2022-05-23: qty 7

## 2022-05-23 MED ORDER — SODIUM CHLORIDE 0.9 % IV BOLUS
250.0000 mL | Freq: Once | INTRAVENOUS | Status: AC
  Administered 2022-05-23: 250 mL via INTRAVENOUS

## 2022-05-23 MED ORDER — CARVEDILOL 6.25 MG PO TABS: 6.2500 mg | ORAL_TABLET | Freq: Two times a day (BID) | ORAL | Status: DC

## 2022-05-23 MED ORDER — ACETAMINOPHEN 325 MG PO TABS
650.0000 mg | ORAL_TABLET | Freq: Four times a day (QID) | ORAL | Status: DC | PRN
  Administered 2022-05-23: 650 mg via ORAL
  Filled 2022-05-23: qty 2

## 2022-05-23 MED ORDER — PREGABALIN 25 MG PO CAPS
25.0000 mg | ORAL_CAPSULE | Freq: Two times a day (BID) | ORAL | Status: DC
  Administered 2022-05-23: 25 mg via ORAL
  Filled 2022-05-23: qty 1

## 2022-05-23 MED ORDER — OXYCODONE HCL 5 MG PO TABS: 5.0000 mg | ORAL_TABLET | ORAL | Status: DC | PRN

## 2022-05-23 MED ORDER — MIDODRINE HCL 5 MG PO TABS
10.0000 mg | ORAL_TABLET | Freq: Three times a day (TID) | ORAL | Status: DC
  Administered 2022-05-23 (×2): 10 mg via ORAL
  Filled 2022-05-23 (×2): qty 2

## 2022-05-23 MED ORDER — CARVEDILOL 3.125 MG PO TABS: 3.1250 mg | ORAL_TABLET | Freq: Two times a day (BID) | ORAL | Status: DC

## 2022-05-23 MED ORDER — ARFORMOTEROL TARTRATE 15 MCG/2ML IN NEBU
15.0000 ug | INHALATION_SOLUTION | Freq: Two times a day (BID) | RESPIRATORY_TRACT | Status: DC
  Administered 2022-05-23: 15 ug via RESPIRATORY_TRACT
  Filled 2022-05-23 (×3): qty 2

## 2022-05-23 MED ORDER — ALBUTEROL SULFATE (2.5 MG/3ML) 0.083% IN NEBU: 2.5000 mg | INHALATION_SOLUTION | RESPIRATORY_TRACT | Status: DC | PRN

## 2022-05-23 MED ORDER — ONDANSETRON HCL 4 MG/2ML IJ SOLN
4.0000 mg | Freq: Four times a day (QID) | INTRAMUSCULAR | Status: DC | PRN
  Administered 2022-05-24: 4 mg via INTRAVENOUS
  Filled 2022-05-23: qty 2

## 2022-05-23 MED ORDER — HYDRALAZINE HCL 50 MG PO TABS: 50.0000 mg | ORAL_TABLET | Freq: Three times a day (TID) | ORAL | Status: DC

## 2022-05-23 MED ORDER — APIXABAN 5 MG PO TABS
10.0000 mg | ORAL_TABLET | Freq: Two times a day (BID) | ORAL | Status: DC
  Administered 2022-05-23 (×2): 10 mg via ORAL
  Filled 2022-05-23 (×2): qty 2

## 2022-05-23 MED ORDER — DULOXETINE HCL 30 MG PO CPEP
30.0000 mg | ORAL_CAPSULE | Freq: Every day | ORAL | Status: DC
  Administered 2022-05-23: 30 mg via ORAL
  Filled 2022-05-23: qty 1

## 2022-05-23 MED ORDER — BUDESONIDE 0.5 MG/2ML IN SUSP
0.5000 mg | Freq: Two times a day (BID) | RESPIRATORY_TRACT | Status: DC
  Administered 2022-05-23: 0.5 mg via RESPIRATORY_TRACT
  Filled 2022-05-23 (×2): qty 2

## 2022-05-23 MED ORDER — SODIUM CHLORIDE 0.9% FLUSH: 10.0000 mL | INTRAVENOUS | Status: DC | PRN

## 2022-05-23 MED ORDER — ACETAMINOPHEN 650 MG RE SUPP: 650.0000 mg | Freq: Four times a day (QID) | RECTAL | Status: DC | PRN

## 2022-05-23 MED ORDER — SODIUM CHLORIDE 0.9 % IV BOLUS
1000.0000 mL | Freq: Once | INTRAVENOUS | Status: AC
  Administered 2022-05-23: 1000 mL via INTRAVENOUS

## 2022-05-23 MED ORDER — APIXABAN 5 MG PO TABS: 5.0000 mg | ORAL_TABLET | Freq: Two times a day (BID) | ORAL | Status: DC

## 2022-05-23 MED ORDER — SODIUM CHLORIDE 0.45 % IV BOLUS: 1000.0000 mL | Freq: Once | INTRAVENOUS | Status: DC

## 2022-05-23 MED ORDER — HEPARIN (PORCINE) 25000 UT/250ML-% IV SOLN
700.0000 [IU]/h | INTRAVENOUS | Status: DC
  Administered 2022-05-23: 850 [IU]/h via INTRAVENOUS
  Filled 2022-05-23: qty 250

## 2022-05-23 MED ORDER — SODIUM CHLORIDE 0.9 % IV SOLN: INTRAVENOUS | Status: DC

## 2022-05-23 MED ORDER — SODIUM CHLORIDE 0.9 % IV SOLN
250.0000 mL | INTRAVENOUS | Status: DC
  Administered 2022-05-23: 250 mL via INTRAVENOUS

## 2022-05-23 MED ORDER — CHLORHEXIDINE GLUCONATE CLOTH 2 % EX PADS
6.0000 | MEDICATED_PAD | Freq: Every day | CUTANEOUS | Status: DC
  Administered 2022-05-23: 6 via TOPICAL

## 2022-05-23 MED ORDER — SODIUM CHLORIDE 0.9% FLUSH
10.0000 mL | Freq: Two times a day (BID) | INTRAVENOUS | Status: DC
  Administered 2022-05-23 – 2022-05-24 (×2): 10 mL

## 2022-05-23 MED ORDER — SODIUM CHLORIDE 0.9 % IV SOLN
1.0000 g | INTRAVENOUS | Status: DC
  Administered 2022-05-23: 1 g via INTRAVENOUS
  Filled 2022-05-23 (×3): qty 10

## 2022-05-23 MED ORDER — STERILE WATER FOR INJECTION IV SOLN
INTRAVENOUS | Status: DC
  Filled 2022-05-23: qty 150
  Filled 2022-05-23: qty 1000

## 2022-05-23 MED ORDER — SODIUM CHLORIDE 0.9 % IV SOLN: 1.0000 g | INTRAVENOUS | Status: DC

## 2022-05-23 NOTE — Progress Notes (Signed)
This nurse reached out to MD due to low BP 63/34,Pt. Drowsy but able to answer question, orientated x3 Pt. Placed in trendelenburg bolus of IV fluids given per MD. Rapid response engaged

## 2022-05-23 NOTE — Progress Notes (Signed)
[facility] Progress Note Patient Name: Lisa Lucas DOB: 1935/07/18 MRN: [id number]   Date of Service  05/23/2022  HPI/Events of Note  Unable to obtain oral temperature, obtain rectal temp instead  Suspected severe hypothermia  eICU Interventions  Bair hugger ordered  Rectal temps as needed.   0432 -called by bedside nursing staff that the patient has had a reduced level of consciousness for at least 1 to 2 hours.  At that point patient has received no new medications, no opiates including as needed oxycodone on the profile.  Patient has azotemia with BUN in the 120s, acidosis, and a history of hypercapnia.  ABG was obtained which showed improvement in pH and no evidence of hypercapnia.  Morning labs resulted which showed improvement in bicarb in the setting of ongoing bicarb infusion and stable renal function overall.  0.4 mg of Narcan was administered with little to no effect.  I discussed the case with patient's family at bedside.  She is already DNR and given that her encephalopathy is not able to be reversed, the concern is this is representative of her eventual decline.  Additional family members will present at bedside and if this is truly secondary to her underlying azotemia, I recommended the family consider comfort measures.  Will return to the room to discuss when all family members are at bedside. 0634 -discussed with 3 additional family members at bedside including medical power of attorney.  Patient appears to have worsening mentation in the setting of renal failure complicated by septic shock from likely urinary tract infection.  Although some degree of this may be reversible, her overall health and comorbidities make it difficult for her to prevail without a prolonged recovery.  It seems that this would be inconsistent with Lisa Lucas's wishes.  Family will talk it over, spent some time at bedside with Lisa Lucas, and try to readdress this with the morning team.   Recommendation was given for potential comfort care and the family will think this over.  Otherwise continue current care until Lisa Lucas changes.  Intervention Category Minor Interventions: Routine modifications to care plan (e.g. PRN medications for pain, fever)  Lisa Lucas 05/23/2022, 9:58 PM

## 2022-05-23 NOTE — Consult Note (Signed)
NAME:  Lisa Lucas, MRN:  [id number], DOB:  Aug 25, 1935, LOS: 1 ADMISSION DATE:  05/20/2022, CONSULTATION DATE:  3/5 REFERRING MD:  Dr. Renne Crigler, CHIEF COMPLAINT:  Shock   History of Present Illness:  87 year old female with past medical history as below, which is significant for COPD on home oxygen, DVT/PE on Eliquis, hypertension, hyperlipidemia, and CKD stage IV.  Family also reports an additional history of dementia.  Patient lives at home.  Patient presented to [facility] emergency department on 3/4 with complaints of fatigue and malaise for the preceding 2 weeks and dysuria for the preceding 5 days.  She also reported poor p.o. intake for 1 week.  She was seen by PCP 2 days prior to presentation and started on Macrobid for UTI.  She was started on empiric antibiotics for UTI.  CT of the chest and abdomen demonstrated a focal outpouching of the left aortic arch.  She was transferred to [facility] for evaluation by vascular surgery.  3/5 she was noted to be hypotensive, which was refractory to 4+ liters of IV fluid resuscitation.  PCCM was consulted for further evaluation and potential ICU transfer.  Pertinent  Medical History   has a past medical history of Arthritis, Coplen's cyst, Cancer ([place]), COPD (chronic obstructive pulmonary disease) ([place]), DVT (deep venous thrombosis) ([place]) (12/2010), History of kidney cancer, Hypercholesteremia, Hypertension, Osteoporosis, Peripheral vascular disease ([place]), Pneumonia, PONV (postoperative nausea and vomiting), Pulmonary embolism ([place]) (12/2010), Renal insufficiency, and Spinal stenosis.   Significant Hospital Events: Including procedures, antibiotic start and stop dates in addition to other pertinent events   3/4 present to [facility] ED with c/o fatigue, dysuria, weakness, Tx to [facility] for vascular eval of aortic aneurysms.  3/5 Hypotensive despite IVF, lethargic, tx to ICU for pressors.  Interim History / Subjective:    Objective   Blood pressure (!) 97/40,  pulse (!) 57, temperature (!) 97.5 F (36.4 C), temperature source Oral, resp. rate 14, height '5\' 5"'$  (1.651 m), weight 76.7 kg, SpO2 99 %.        Intake/Output Summary (Last 24 hours) at 05/23/2022 1506 Last data filed at 05/23/2022 1315 Gross per 24 hour  Intake 400 ml  Output --  Net 400 ml   Filed Weights   05/28/2022 1529  Weight: 76.7 kg    Examination: General: critically ill appearing elderly female HENT: [place]/AT, PERRL, no JVD Lungs: Clear bilateral breath sounds. Paradoxical respirations at times.  Cardiovascular: brady, regular, no mrg. Cap refill ~ 3 seconds Abdomen: Soft, diffusely tender, normoactive.  Extremities: No acute deformity or ROM limitation.  Neuro: Somnolent, but will arouse to verbal stimuli and answers orientation questions appropriately.   Resolved Hospital Problem list     Assessment & Plan:   Septic shock secondary to UTI - Transfer to ICU - Broaden ABX to ctx, vanco - s/p 4 L IVF - Start NE for MAP goal 65 mmHg - Urine culture pending - Echocardiogram - PICC  AKI on CKD IV - Creat 5.3 and BUN 136 at the time of ICU transfer - Patient has been clear she would never want HD - Hopefully this can improve with adequate hydration and perfusion, otherwise would need to consider palliation.  - Bicarb infusion  Acute metabolic encephalopathy: sepsis, shock, uremia.  - Supportive care - BP support  COPD without acute exacerbation Chronic hypoxemic respiratory failure - Budesonide, brovana, yupelri in leiu of home Trelegy.  - PRN albuterol - Supplemental O2 to keep sats 88-96%  HTN -  holding home carvedilol, chlorthalidone, telmisartan  Left aortic arch aneurysm  - evaluated by vascular surgery. Too unstable for further evaluation or intervention at this time. Will plan to re-involve if she is able to recover. The need for IV contrast will remain a barrier.   Hx DVT/PE - last identified as active in 2018. Previous reports in 2013 as well so I  assume she has be prescribed lifetime anticoagulation.  - Hold home eliquis while NPO - heparin infusion   Best Practice (right click and "Reselect all SmartList Selections" daily)   Diet/type: NPO DVT prophylaxis: systemic heparin GI prophylaxis: N/A Lines: Central line Foley:  Yes, and it is still needed Code Status:  DNR Last date of multidisciplinary goals of care discussion [ 3/5 confirmed code status with Daughter via phone.]  Labs   CBC: Recent Labs  Lab 05/20/2022 1551 05/23/22 0359  WBC 6.5 6.6  NEUTROABS  --  5.3  HGB 10.6* 9.7*  HCT 35.1* 31.4*  MCV 98.0 97.8  PLT 157 138*    Basic Metabolic Panel: Recent Labs  Lab 06/04/2022 1551  1858 05/23/22 0359  NA 134*  --  135  K 5.4* 5.5* 5.1  CL 105  --  109  CO2 16*  --  17*  GLUCOSE 108*  --  144*  BUN 133*  --  136*  CREATININE 5.24*  --  5.31*  CALCIUM 8.9  --  8.5*  MG  --   --  2.3   GFR: Estimated Creatinine Clearance: 8.1 mL/min (A) (by C-G formula based on SCr of 5.31 mg/dL (H)). Recent Labs  Lab 06/17/2022 1551 05/27/2022 1638 06/13/2022 1858 05/23/22 0359  WBC 6.5  --   --  6.6  LATICACIDVEN  --  1.3 1.0  --     Liver Function Tests: Recent Labs  Lab 05/21/2022 1639 05/23/22 0359  AST 22 18  ALT 8 8  ALKPHOS 73 60  BILITOT 0.8 0.5  PROT 7.0 5.7*  ALBUMIN 3.6 2.7*   Recent Labs  Lab 05/23/2022 1639  LIPASE 47   No results for input(s): "AMMONIA" in the last 168 hours.  ABG    Component Value Date/Time   TCO2 24 04/03/2012 1253     Coagulation Profile: No results for input(s): "INR", "PROTIME" in the last 168 hours.  Cardiac Enzymes: No results for input(s): "CKTOTAL", "CKMB", "CKMBINDEX", "TROPONINI" in the last 168 hours.  HbA1C: No results found for: "HGBA1C"  CBG: Recent Labs  Lab 06/07/2022 1538 05/23/22 1454  GLUCAP 94 129*    Review of Systems:   Patient is encephalopathic and/or intubated. Therefore history has been obtained from chart review.    Past  Medical History:  She,  has a past medical history of Arthritis, Slight's cyst, Cancer ([place]), COPD (chronic obstructive pulmonary disease) ([place]), DVT (deep venous thrombosis) ([place]) (12/2010), History of kidney cancer, Hypercholesteremia, Hypertension, Osteoporosis, Peripheral vascular disease ([place]), Pneumonia, PONV (postoperative nausea and vomiting), Pulmonary embolism ([place]) (12/2010), Renal insufficiency, and Spinal stenosis.   Surgical History:   Past Surgical History:  Procedure Laterality Date   ABDOMINAL HYSTERECTOMY     APPENDECTOMY     BACK SURGERY     COLONOSCOPY  12/29/2011   Procedure: COLONOSCOPY;  Surgeon: Danie Binder, MD;  Location: [facility] ENDO SUITE;  Service: Endoscopy;  Laterality: N/A;  11:30   HERNIA REPAIR     KIDNEY SURGERY  2010   bilateral renal cancer s/p bilateral partial nephrectomy   LEG SURGERY  right leg for blood clot   LUMBAR LAMINECTOMY  04/16/2012   Dr Joya Salm   LUMBAR LAMINECTOMY/DECOMPRESSION MICRODISCECTOMY  01/30/2012   Procedure: LUMBAR LAMINECTOMY/DECOMPRESSION MICRODISCECTOMY 1 LEVEL;  Surgeon: Floyce Stakes, MD;  Location: [facility] NEURO ORS;  Service: Neurosurgery;  Laterality: Left;  Left Lumbar Four-Five Diskectomy   LUMBAR LAMINECTOMY/DECOMPRESSION MICRODISCECTOMY  04/16/2012   Procedure: LUMBAR LAMINECTOMY/DECOMPRESSION MICRODISCECTOMY 1 LEVEL;  Surgeon: Floyce Stakes, MD;  Location: [facility] NEURO ORS;  Service: Neurosurgery;  Laterality: Left;  Left Lumbar four-five Redo Diskectomy     Social History:   reports that she quit smoking about 11 years ago. Her smoking use included cigarettes. She has a 15.00 pack-year smoking history. She has been exposed to tobacco smoke. She has never used smokeless tobacco. She reports that she does not drink alcohol and does not use drugs.   Family History:  Her family history includes Colon cancer in her brother and daughter.   Allergies Allergies  Allergen Reactions   Hydrocodone Nausea Only   Morphine And  Related Nausea Only   Oxycodone Nausea Only     Home Medications  Prior to Admission medications   Medication Sig Start Date End Date Taking? Authorizing Provider  acetaminophen (TYLENOL) 500 MG tablet Take 1,000 mg by mouth every 6 (six) hours as needed for moderate pain.   Yes [provider]  albuterol (PROVENTIL) (5 MG/ML) 0.5% nebulizer solution Take 0.5 mLs (2.5 mg total) by nebulization every 2 (two) hours as needed for wheezing or shortness of breath. 04/20/12  Yes Kristeen Miss, MD  allopurinol (ZYLOPRIM) 100 MG tablet Take 50 mg by mouth every other day.   Yes [provider]  apixaban (ELIQUIS) 5 MG TABS tablet Take 2 tablets (10 mg total) by mouth 2 (two) times daily. 07/10/16  Yes Laqueta Linden, MD  calcitRIOL (ROCALTROL) 0.25 MCG capsule Take 0.25 mcg by mouth 3 (three) times a week.   Yes [provider]  Calcium Carb-Cholecalciferol (CALCIUM 600/VITAMIN D3) 600-800 MG-UNIT TABS Take 1 tablet by mouth every morning.    Yes [provider]  carvedilol (COREG) 6.25 MG tablet Take 6.25 mg by mouth 2 (two) times daily with a meal. 09/04/16  Yes [provider]  chlorthalidone (HYGROTON) 25 MG tablet Take 12.5 mg by mouth daily.   Yes [provider]  DULoxetine (CYMBALTA) 30 MG capsule Take 30 mg by mouth daily. 05/12/22  Yes [provider]  hydrALAZINE (APRESOLINE) 50 MG tablet Take 50 mg by mouth 3 (three) times daily. 03/17/22 03/17/23 Yes [provider]  levocetirizine (XYZAL) 5 MG tablet Take 5 mg by mouth at bedtime as needed.   Yes [provider]  pregabalin (LYRICA) 50 MG capsule Take 50 mg by mouth 2 (two) times daily.   Yes [provider]  sodium zirconium cyclosilicate (LOKELMA) 5 g packet Take by mouth.   Yes [provider]  telmisartan (MICARDIS) 20 MG tablet Take 20 mg by mouth daily.   Yes [provider]  TRELEGY ELLIPTA 100-62.5-25 MCG/ACT AEPB Inhale 1  puff into the lungs daily. 09/26/18  Yes [provider]     Critical care time: 49 minutes    Georgann Housekeeper, AGACNP-BC [facility]  See Amion for personal pager PCCM on call pager [phone] until 7pm. Please call Elink 7p-7a. [phone]  05/23/2022 4:10 PM

## 2022-05-23 NOTE — Progress Notes (Addendum)
Pt. Drowsy oriented x 4, MD notified at bedside of yellow mews Temp- 97.5, BP-81/36, HR-47,RR-13, O2-94% 2L [place], see chart for new orders

## 2022-05-23 NOTE — Progress Notes (Signed)
Patient seen and examined. Transferring to ICU. Currently on NE 5 mcg with 1:1 nursing monitoring until she gets to the ICU. Vanc added to cover possible enterococcus. PICC requested-- daughter confirmed patient has always been adamant about not wanting dialysis.   Full consult note to follow.  Julian Hy, DO 05/23/22 3:39 PM [facility]  For contact information, see Amion. If no response to pager, please call PCCM consult pager. After hours, 7PM- 7AM, please call Elink.

## 2022-05-23 NOTE — Progress Notes (Signed)
ANTICOAGULATION CONSULT NOTE - Initial Consult  Pharmacy Consult for IV heparin Indication: DVT  Allergies  Allergen Reactions   Hydrocodone Nausea Only   Morphine And Related Nausea Only   Oxycodone Nausea Only    Patient Measurements: Height: '5\' 5"'$  (165.1 cm) Weight: 76.7 kg (169 lb) IBW/kg (Calculated) : 57 Heparin Dosing Weight: 72 kg  Vital Signs: Temp: 97.3 F (36.3 C) (03/05 1512) Temp Source: Oral (03/05 1512) BP: 106/45 (03/05 1613) Pulse Rate: 62 (03/05 1613)  Labs: Recent Labs    05/21/2022 1551 05/21/2022 1639 05/23/2022 1858 05/23/22 0359  HGB 10.6*  --   --  9.7*  HCT 35.1*  --   --  31.4*  PLT 157  --   --  138*  CREATININE 5.24*  --   --  5.31*  TROPONINIHS  --  7 6  --     Estimated Creatinine Clearance: 8.1 mL/min (A) (by C-G formula based on SCr of 5.31 mg/dL (H)).   Medical History: Past Medical History:  Diagnosis Date   Arthritis    Kerin's cyst    Cancer ([place])    kidney bil    COPD (chronic obstructive pulmonary disease) ([place])    3L intermittent home O2   DVT (deep venous thrombosis) ([place]) 12/2010   right lower extremity and PE but also had prior DVT and had been on coumadin but converted to plavix/asa due to "reaction" until she presented with another DVT/PE in 12/2010.;  LLE DVT 02/2012   History of kidney cancer    Hypercholesteremia    Hypertension    Osteoporosis    Peripheral vascular disease ([place])    Pneumonia    recent  1 month ago   PONV (postoperative nausea and vomiting)    has difficulty breathing coming out of anesthesia,   Pulmonary embolism ([place]) 12/2010   Renal insufficiency    Spinal stenosis     Medications:  Infusions:   sodium chloride     ceFEPime (MAXIPIME) IV     heparin     norepinephrine (LEVOPHED) Adult infusion 5 mcg/min (05/23/22 1534)   sodium bicarbonate 150 mEq in sterile water 1,150 mL infusion     sodium chloride     vancomycin      Assessment: 87 yo female on chronic Eliquis for hx DVT  now NPO and transferring to ICU.  Pharmacy asked to switch anticoagulation to IV heparin.  Last dose of Eliquis was 10 mg this AM at 0900.  No overt bleeding or complications noted.   Goal of Therapy:  Heparin level 0.3-0.7 units/ml aPTT 66-102 Monitor platelets by anticoagulation protocol: Yes   Plan:  Start IV Heparin tonight at 850 units/hr at 9 pm. Check aPTT 8 hrs after gtt started.  Heparin levels will be falsely elevated from recent Eliquis. Daily heparin level, aPTT and CBC.  [place] Crane, Roylene Reason, BCCP Clinical Pharmacist  05/23/2022 4:49 PM   [facility] pharmacy phone numbers are listed on [email or web address]

## 2022-05-23 NOTE — Plan of Care (Signed)

## 2022-05-23 NOTE — Progress Notes (Signed)
PROGRESS NOTE  Lisa Lucas [id number] DOB: [birth year] DOA: 06/07/2022 PCP: Celene Squibb, MD   LOS: 1 day   Brief Narrative / Interim history: 87 year old female with history of COPD, prior DVT and PE, HTN, HLD, peripheral vascular disease, chronic kidney disease stage IV comes into the hospital with increased fatigue, sleepiness, poor p.o. intake for the last 2 weeks.  She also reports dysuria that started 4 to 5 days prior to admission.  She also reports dark urine.  As an outpatient she apparently was started on Macrobid by PCP.  In the ED she was found to have acute kidney injury.  CT scan of the abdomen and pelvis showed a large 2.3 cm focal outpouching of the left aortic arch concerning for pseudoaneurysm or penetrating atherosclerotic ulcer.  Vascular surgery was consulted and she was admitted to the hospital.  Subjective / 24h Interval events: She is feeling tired this morning.  Alert and oriented x 4, answers all questions appropriately.  No specific complaints this morning, no chest pain, no shortness of breath, no abdominal pain  Assesement and Plan: Principal Problem:   Acute metabolic encephalopathy Active Problems:   AKI (acute kidney injury) ([place])   HTN (hypertension)   Chronic anticoagulation   UTI (urinary tract infection)   Generalized weakness   COPD (chronic obstructive pulmonary disease) ([facility])   Hyperkalemia   Principal problem Acute kidney injury on chronic kidney disease stage IV -most recent creatinine available was 2.2, comes in with a creatinine in the 5 range.  This is likely a combination of poor p.o. intake, possible UTI, and also at home she is on chlorthalidone and telmisartan, and also has been having soft blood pressures here.  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  Continue supportive care  Acute UTI -CT scan concerning for focal cystitis, no evidence of hydronephrosis or retention.  Urinalysis positive for many bacteria and pyuria.  She was started on ceftriaxone and urine cultures were sent.  COPD with chronic hypoxic respiratory failure -on 2 L at home, no wheezing  History of DVT/PE on chronic anticoagulation -continue Eliquis  Essential hypertension -hypotensive this morning, continue Coreg at half of the home dose with holding parameters, stop hydralazine in the setting of acute kidney injury and soft blood pressures.  Monitor closely not to allow too high pressures given the pseudoaneurysm -Bolus 1 L this morning  Hyperkalemia -due to her AKI.  Potassium better this morning after fluids  Scheduled Meds:  apixaban  10 mg Oral BID   carvedilol  6.25 mg Oral BID WC   DULoxetine  30 mg Oral Daily   fluticasone furoate-vilanterol  1 puff Inhalation Daily   And   umeclidinium bromide  1 puff Inhalation Daily   hydrALAZINE  50 mg Oral TID   pregabalin  50 mg Oral BID   Continuous Infusions:  sodium chloride 100 mL/hr at 05/23/22 0057   cefTRIAXone (ROCEPHIN)  IV     PRN Meds:.acetaminophen **OR** acetaminophen, albuterol, ondansetron **OR** ondansetron (ZOFRAN) IV, oxyCODONE  Current Outpatient Medications  Medication Instructions   acetaminophen (TYLENOL) 1,000 mg, Oral, Every 6 hours PRN   albuterol (PROVENTIL) 2.5 mg, Nebulization, Every 2 hours PRN   allopurinol (ZYLOPRIM) 50 mg, Oral, Every other day  apixaban (ELIQUIS) 10 mg, Oral, 2 times daily   calcitRIOL (ROCALTROL) 0.25 mcg, Oral, 3 times weekly   Calcium Carb-Cholecalciferol (CALCIUM 600/VITAMIN D3) 600-800 MG-UNIT TABS 1 tablet, Oral, Every morning   carvedilol (COREG) 6.25 mg, Oral, 2 times daily with meals   chlorthalidone (HYGROTON) 12.5 mg, Oral, Daily    DULoxetine (CYMBALTA) 30 mg, Oral, Daily   hydrALAZINE (APRESOLINE) 50 mg, Oral, 3 times daily   levocetirizine (XYZAL) 5 mg, Oral, At bedtime PRN   pregabalin (LYRICA) 50 mg, Oral, 2 times daily   sodium zirconium cyclosilicate (LOKELMA) 5 g packet Oral   telmisartan (MICARDIS) 20 mg, Oral, Daily   TRELEGY ELLIPTA 100-62.5-25 MCG/ACT AEPB 1 puff, Inhalation, Daily    Diet Orders (From admission, onward)     Start     Ordered   05/23/22 0025  Diet Heart Room service appropriate? Yes; Fluid consistency: Thin  Diet effective now       Question Answer Comment  Room service appropriate? Yes   Fluid consistency: Thin      05/23/22 0024            DVT prophylaxis: SCDs Start: 05/23/22 0025 apixaban (ELIQUIS) tablet 10 mg   Lab Results  Component Value Date   PLT 138 (L) 05/23/2022      Code Status: DNR  Family Communication: No family at bedside  Status is: Inpatient  Remains inpatient appropriate because: severity of illness  Level of care: Telemetry Medical  Consultants:  Vascular surgery   Objective: Vitals:   05/23/22 0035 05/23/22 0036 05/23/22 0102 05/23/22 0546  BP:  (!) 120/107 (!) 102/58 (!) 94/59  Pulse:   (!) 52 (!) 52  Resp:  '11 13 12  '$ Temp: 97.6 F (36.4 C)   97.6 F (36.4 C)  TempSrc: Oral     SpO2:   98% 97%  Weight:      Height:       No intake or output data in the 24 hours ending 05/23/22 0625 Wt Readings from Last 3 Encounters:   76.7 kg  07/09/16 77 kg  01/10/15 81.6 kg    Examination:  Constitutional: NAD Eyes: no scleral icterus ENMT: Mucous membranes are dry.  Neck: normal, supple Respiratory: clear to auscultation bilaterally, no wheezing, no crackles. Normal respiratory effort. No accessory muscle use.  Cardiovascular: Regular rate and rhythm, no murmurs / rubs / gallops. No LE edema.  Abdomen: non distended, no tenderness. Bowel sounds positive.  Musculoskeletal: no clubbing / cyanosis.  Skin: no  rashes Neurologic: Weak throughout but non focal   Data Reviewed: I have independently reviewed following labs and imaging studies   CBC Recent Labs  Lab 06/07/2022 1551 05/23/22 0359  WBC 6.5 6.6  HGB 10.6* 9.7*  HCT 35.1* 31.4*  PLT 157 138*  MCV 98.0 97.8  MCH 29.6 30.2  MCHC 30.2 30.9  RDW 14.8 14.8  LYMPHSABS  --  0.6*  MONOABS  --  0.6  EOSABS  --  0.1  BASOSABS  --  0.0    Recent Labs  Lab 06/06/2022 1551 06/14/2022 1638 06/15/2022 1639 06/01/2022 1858 05/23/22 0359  NA 134*  --   --   --  135  K 5.4*  --   --  5.5* 5.1  CL 105  --   --   --  109  CO2 16*  --   --   --  17*  GLUCOSE 108*  --   --   --  144*  BUN 133*  --   --   --  136*  CREATININE 5.24*  --   --   --  5.31*  CALCIUM 8.9  --   --   --  8.5*  AST  --   --  22  --  18  ALT  --   --  8  --  8  ALKPHOS  --   --  73  --  60  BILITOT  --   --  0.8  --  0.5  ALBUMIN  --   --  3.6  --  2.7*  MG  --   --   --   --  2.3  LATICACIDVEN  --  1.3  --  1.0  --     ------------------------------------------------------------------------------------------------------------------ No results for input(s): "CHOL", "HDL", "LDLCALC", "TRIG", "CHOLHDL", "LDLDIRECT" in the last 72 hours.  No results found for: "HGBA1C" ------------------------------------------------------------------------------------------------------------------ No results for input(s): "TSH", "T4TOTAL", "T3FREE", "THYROIDAB" in the last 72 hours.  Invalid input(s): "FREET3"  Cardiac Enzymes No results for input(s): "CKMB", "TROPONINI", "MYOGLOBIN" in the last 168 hours.  Invalid input(s): "CK" ------------------------------------------------------------------------------------------------------------------    Component Value Date/Time   BNP 29.0 07/09/2016 1524    CBG: Recent Labs  Lab  1538  GLUCAP 94    Recent Results (from the past 240 hour(s))  Resp panel by RT-PCR (RSV, Flu A&B, Covid) Anterior Nasal Swab     Status:  None   Collection Time: 05/27/2022  4:49 PM   Specimen: Anterior Nasal Swab  Result Value Ref Range Status   SARS Coronavirus 2 by RT PCR NEGATIVE NEGATIVE Final    Comment: (NOTE) SARS-CoV-2 target nucleic acids are NOT DETECTED.  The SARS-CoV-2 RNA is generally detectable in upper respiratory specimens during the acute phase of infection. The lowest concentration of SARS-CoV-2 viral copies this assay can detect is 138 copies/mL. A negative result does not preclude SARS-Cov-2 infection and should not be used as the sole basis for treatment or other patient management decisions. A negative result may occur with  improper specimen collection/handling, submission of specimen other than nasopharyngeal swab, presence of viral mutation(s) within the areas targeted by this assay, and inadequate number of viral copies(<138 copies/mL). A negative result must be combined with clinical observations, patient history, and epidemiological information. The expected result is Negative.  Fact Sheet for Patients:  EntrepreneurPulse.com.au  Fact Sheet for Healthcare Providers:  IncredibleEmployment.be  This test is no t yet approved or cleared by the [place] FDA and  has been authorized for detection and/or diagnosis of SARS-CoV-2 by FDA under an Emergency Use Authorization (EUA). This EUA will remain  in effect (meaning this test can be used) for the duration of the COVID-19 declaration under Section 564(b)(1) of the Act, 21 U.S.C.section 360bbb-3(b)(1), unless the authorization is terminated  or revoked sooner.       Influenza A by PCR NEGATIVE NEGATIVE Final   Influenza B by PCR NEGATIVE NEGATIVE Final    Comment: (NOTE) The Xpert Xpress SARS-CoV-2/FLU/RSV plus assay is intended as an aid in the diagnosis of influenza from Nasopharyngeal swab specimens and should not be used as a sole basis for treatment. Nasal washings and aspirates are unacceptable  for Xpert Xpress SARS-CoV-2/FLU/RSV testing.  Fact Sheet for Patients: EntrepreneurPulse.com.au  Fact Sheet for Healthcare Providers: IncredibleEmployment.be  This test is not yet approved or cleared by the [place] FDA and has been authorized for detection and/or diagnosis of SARS-CoV-2 by FDA under an Emergency Use  Authorization (EUA). This EUA will remain in effect (meaning this test can be used) for the duration of the COVID-19 declaration under Section 564(b)(1) of the Act, 21 U.S.C. section 360bbb-3(b)(1), unless the authorization is terminated or revoked.     Resp Syncytial Virus by PCR NEGATIVE NEGATIVE Final    Comment: (NOTE) Fact Sheet for Patients: EntrepreneurPulse.com.au  Fact Sheet for Healthcare Providers: IncredibleEmployment.be  This test is not yet approved or cleared by the [place] FDA and has been authorized for detection and/or diagnosis of SARS-CoV-2 by FDA under an Emergency Use Authorization (EUA). This EUA will remain in effect (meaning this test can be used) for the duration of the COVID-19 declaration under Section 564(b)(1) of the Act, 21 U.S.C. section 360bbb-3(b)(1), unless the authorization is terminated or revoked.  Performed at [facility], [address] [place], [place] [address]      Radiology Studies: CT CHEST ABDOMEN PELVIS WO CONTRAST  Result Date: 06/15/2022 CLINICAL DATA:  Chest mass EXAM: CT CHEST, ABDOMEN AND PELVIS WITHOUT CONTRAST TECHNIQUE: Multidetector CT imaging of the chest, abdomen and pelvis was performed following the standard protocol without IV contrast. RADIATION DOSE REDUCTION: This exam was performed according to the departmental dose-optimization program which includes automated exposure control, adjustment of the mA and/or kV according to patient size and/or use of iterative reconstruction technique. COMPARISON:  None Available.  FINDINGS: CT CHEST FINDINGS Cardiovascular: Normal heart size. No pericardial effusion. Severe atherosclerotic disease of the thoracic aorta. Large focal outpouching of the left aortic arch measuring 2.3 x 2.3 cm on series 2 image 20. Additional small focal outpouching of the posterior margin of the descending thoracic aorta measuring 8 mm on series 2, image 43. Severe coronary artery calcifications. Mediastinum/Nodes: Esophagus and thyroid are unremarkable. No pathologically enlarged lymph nodes seen in the chest. Lungs/Pleura: Central airways are patent. Elevation of the right hemidiaphragm. Right basilar atelectasis. Severe centrilobular emphysema. No consolidation, pleural effusion or pneumothorax. Musculoskeletal: No chest wall mass or suspicious bone lesions identified. CT ABDOMEN PELVIS FINDINGS Hepatobiliary: No focal liver abnormality is seen. Gallstones with no evidence of gallbladder wall thickening. No biliary ductal dilation. Pancreas: Unremarkable. No pancreatic ductal dilatation or surrounding inflammatory changes. Spleen: Well-circumscribed exophytic lesion of the anterior spleen measuring 4 cm, finding has been present on prior exams dating back to 0000000 and is almost certainly benign. Adrenals/Urinary Tract: Unchanged thickening of the bilateral adrenal glands. No hydronephrosis. Exophytic lesion of the posterior left kidney which is consistent with a simple cysts. Hyperdense lesion of the anterior right kidney on series 2, image 54 measuring 10 mm which is consistent with a proteinaceous cyst additional bilateral renal lesions are seen which are too small to accurately characterize. No specific follow-up imaging is recommended for renal lesions. Large right bladder wall diverticulum with associated wall thickening and perivesicular fat stranding which is most pronounced about the bladder diverticulum. Stomach/Bowel: Stomach is within normal limits. Severe diverticulosis. No evidence of bowel wall  thickening, distention, or inflammatory changes. Vascular/Lymphatic: Severe aortic atherosclerosis. Stent of the right common and external iliac arteries. No enlarged abdominal or pelvic lymph nodes. Reproductive: Status post hysterectomy. No adnexal masses. Other: Multiple fat moderate to large containing ventral abdominal wall hernias. No abdominopelvic ascites. Musculoskeletal: No acute or significant osseous findings. IMPRESSION: 1. Large 2.3 cm focal outpouching of the left aortic arch correlates with finding on same day chest radiograph, differential considerations include pseudoaneurysm or penetrating atherosclerotic ulcer. Aortic protocol CTA of the chest is recommended for further evaluation. 2.  Right bladder wall diverticulum with associated wall thickening and perivesicular fat stranding which is most pronounced about the bladder diverticulum, likely due to focal cystitis of the bladder diverticulum. Recommend correlation with urinalysis. 3. Severe coronary artery calcifications. 4. Severe colonic diverticulosis with no evidence of diverticulitis. 5. Severe aortic Atherosclerosis (ICD10-I70.0) and Emphysema (ICD10-J43.9). Electronically Signed   By: Yetta Glassman M.D.   On: 06/04/2022 19:23   DG Chest Port 1 View  Result Date: 06/05/2022 CLINICAL DATA:  Weakness EXAM: PORTABLE CHEST 1 VIEW COMPARISON:  Chest x-ray 06/29/2017 FINDINGS: The heart size and mediastinal contours are within normal limits. There is a rounded density in the medial left upper lobe measuring 2.4 x 2.0 cm is new. The lungs are otherwise clear. There is no pleural effusion or pneumothorax. Cardiomediastinal silhouette is within normal limits. There are atherosclerotic calcifications of the aorta. IMPRESSION: 1. New rounded density in the medial left upper lobe measuring 2.4 x 2.0 cm. Recommend further evaluation with chest CT. 2. Otherwise no acute cardiopulmonary process. Electronically Signed   By: Ronney Asters M.D.   On:  06/05/2022 17:43     Marzetta Board, MD, PhD [facility]  Between 7 am - 7 pm I am available, please contact me via Amion (for emergencies) or Securechat (non urgent messages)  Between 7 pm - 7 am I am not available, please contact night coverage MD/APP via Amion

## 2022-05-23 NOTE — Progress Notes (Signed)
PT Cancellation Note  Patient Details Name: Lisa Lucas MRN: [id number] DOB: 1935-11-28   Cancelled Treatment:    Reason Eval/Treat Not Completed: Medical issues which prohibited therapy  Pt lethargic but responsive and oriented with delay. BP 71/42 MAP 51; not appropriate for comprehensive PT evaluation at this time. RN notified. Will continue to follow and attempt at a later time, likely tomorrow.  Candie Mile, PT, DPT Physical Therapist [facility] [place] [facility] 05/23/2022, 10:56 AM

## 2022-05-23 NOTE — Consult Note (Addendum)
[facility] NOTE   MRN : [id number]  Reason for Consult: Aortic arch aneurysm Referring Physician: ED  History of Present Illness: 87 y/o female presented to the ED with a CC of weakness.  CT scan was ordered during her work up and there was an incidental finding of  large 2.3 centimeter focal outpouching of the left aortic arch that could be a pseudoaneurysm or penetrating atherosclerotic ulcer.  We have been consult for management.  She denise spinal pain  or abdominal pain.  She feels weak all over.  Somnolent with UTI.   Past medical history  COPD, DVT, pulmonary embolism managed with Eliquis, hyperlipidemia, hypertension.        Current Facility-Administered Medications  Medication Dose Route Frequency Provider Last Rate Last Admin   0.9 %  sodium chloride infusion   Intravenous Continuous Zierle-Ghosh, Asia B, DO 100 mL/hr at 05/23/22 0057 New Bag at 05/23/22 0057   acetaminophen (TYLENOL) tablet 650 mg  650 mg Oral Q6H PRN Zierle-Ghosh, Asia B, DO   650 mg at 05/23/22 0050   Or   acetaminophen (TYLENOL) suppository 650 mg  650 mg Rectal Q6H PRN Zierle-Ghosh, Asia B, DO       albuterol (PROVENTIL) (2.5 MG/3ML) 0.083% nebulizer solution 2.5 mg  2.5 mg Nebulization Q2H PRN Zierle-Ghosh, Asia B, DO       apixaban (ELIQUIS) tablet 10 mg  10 mg Oral BID Zierle-Ghosh, Asia B, DO   10 mg at 05/23/22 0900   carvedilol (COREG) tablet 3.125 mg  3.125 mg Oral BID WC Gherghe, Costin M, MD       cefTRIAXone (ROCEPHIN) 1 g in sodium chloride 0.9 % 100 mL IVPB  1 g Intravenous Q24H Zierle-Ghosh, Asia B, DO       DULoxetine (CYMBALTA) DR capsule 30 mg  30 mg Oral Daily Zierle-Ghosh, Asia B, DO   30 mg at 05/23/22 0859   fluticasone furoate-vilanterol (BREO ELLIPTA) 100-25 MCG/ACT 1 puff  1 puff Inhalation Daily Zierle-Ghosh, Asia B, DO       And   umeclidinium bromide (INCRUSE ELLIPTA) 62.5 MCG/ACT 1 puff  1 puff Inhalation Daily Zierle-Ghosh, Asia B, DO   1 puff  at 05/23/22 0842   ondansetron (ZOFRAN) tablet 4 mg  4 mg Oral Q6H PRN Zierle-Ghosh, Asia B, DO       Or   ondansetron (ZOFRAN) injection 4 mg  4 mg Intravenous Q6H PRN Zierle-Ghosh, Asia B, DO       oxyCODONE (Oxy IR/ROXICODONE) immediate release tablet 5 mg  5 mg Oral Q4H PRN Zierle-Ghosh, Asia B, DO       pregabalin (LYRICA) capsule 50 mg  50 mg Oral BID Zierle-Ghosh, Asia B, DO   50 mg at 05/23/22 0859    Pt meds include: Statin :No Betablocker: No ASA: No Other anticoagulants/antiplatelets: Eliquis  Past Medical History:  Diagnosis Date   Arthritis    Hofman's cyst    Cancer ([place])    kidney bil    COPD (chronic obstructive pulmonary disease) ([place])    3L intermittent home O2   DVT (deep venous thrombosis) ([place]) 12/2010   right lower extremity and PE but also had prior DVT and had been on coumadin but converted to plavix/asa due to "reaction" until she presented with another DVT/PE in 12/2010.;  LLE DVT 02/2012   History of kidney cancer    Hypercholesteremia    Hypertension    Osteoporosis    Peripheral vascular disease (  [place])    Pneumonia    recent  1 month ago   PONV (postoperative nausea and vomiting)    has difficulty breathing coming out of anesthesia,   Pulmonary embolism ([place]) 12/2010   Renal insufficiency    Spinal stenosis     Past Surgical History:  Procedure Laterality Date   ABDOMINAL HYSTERECTOMY     APPENDECTOMY     BACK SURGERY     COLONOSCOPY  12/29/2011   Procedure: COLONOSCOPY;  Surgeon: Danie Binder, MD;  Location: [facility];  Service: Endoscopy;  Laterality: N/A;  11:30   HERNIA REPAIR     KIDNEY SURGERY  2010   bilateral renal cancer s/p bilateral partial nephrectomy   LEG SURGERY     right leg for blood clot   LUMBAR LAMINECTOMY  04/16/2012   Dr Joya Salm   LUMBAR LAMINECTOMY/DECOMPRESSION MICRODISCECTOMY  01/30/2012   Procedure: LUMBAR LAMINECTOMY/DECOMPRESSION MICRODISCECTOMY 1 LEVEL;  Surgeon: Floyce Stakes, MD;  Location: [facility];  Service: Neurosurgery;  Laterality: Left;  Left Lumbar Four-Five Diskectomy   LUMBAR LAMINECTOMY/DECOMPRESSION MICRODISCECTOMY  04/16/2012   Procedure: LUMBAR LAMINECTOMY/DECOMPRESSION MICRODISCECTOMY 1 LEVEL;  Surgeon: Floyce Stakes, MD;  Location: [facility];  Service: Neurosurgery;  Laterality: Left;  Left Lumbar four-five Redo Diskectomy    Social History Social History   Tobacco Use   Smoking status: Former    Packs/day: 0.25    Years: 60.00    Total pack years: 15.00    Types: Cigarettes    Quit date: 12/10/2010    Years since quitting: 11.4    Passive exposure: Past   Smokeless tobacco: Never  Vaping Use   Vaping Use: Never used  Substance Use Topics   Alcohol use: No    Comment: quit long time ago   Drug use: No    Family History Family History  Problem Relation Age of Onset   Colon cancer Daughter        age 85s   Colon cancer Brother        age 33    Allergies  Allergen Reactions   Hydrocodone Nausea Only   Morphine And Related Nausea Only   Oxycodone Nausea Only     REVIEW OF SYSTEMS  General: '[ ]'$  Weight loss, '[ ]'$  Fever, '[ ]'$  chills Neurologic: '[ ]'$  Dizziness, '[ ]'$  Blackouts, '[ ]'$  Seizure '[ ]'$  Stroke, '[ ]'$  "Mini stroke", '[ ]'$  Slurred speech, '[ ]'$  Temporary blindness; [x ] weakness in arms or legs, '[ ]'$  Hoarseness '[ ]'$  Dysphagia Cardiac: '[ ]'$  Chest pain/pressure, '[ ]'$  Shortness of breath at rest '[ ]'$  Shortness of breath with exertion, '[ ]'$  Atrial fibrillation or irregular heartbeat  Vascular: '[ ]'$  Pain in legs with walking, '[ ]'$  Pain in legs at rest, '[ ]'$  Pain in legs at night,  '[ ]'$  Non-healing ulcer, '[ ]'$  Blood clot in vein/DVT,   Pulmonary: '[ ]'$  Home oxygen, '[ ]'$  Productive cough, '[ ]'$  Coughing up blood, '[ ]'$  Asthma,  '[ ]'$  Wheezing [x ] COPD Musculoskeletal:  '[ ]'$  Arthritis, '[ ]'$  Low back pain, '[ ]'$  Joint pain Hematologic: '[ ]'$  Easy Bruising, [x ] Anemia; '[ ]'$  Hepatitis Gastrointestinal: '[ ]'$  Blood in stool, '[ ]'$  Gastroesophageal Reflux/heartburn, Urinary: [ x] chronic  Kidney disease, '[ ]'$  on HD - '[ ]'$  MWF or '[ ]'$  TTHS, '[ ]'$  Burning with urination, '[ ]'$  Difficulty urinating Skin: '[ ]'$  Rashes, '[ ]'$  Wounds Psychological: '[ ]'$  Anxiety, '[ ]'$  Depression  Physical Examination Vitals:   05/23/22 0546 05/23/22 0759 05/23/22 0808 05/23/22 0843  BP: (!) 94/59 (!) 81/36 (!) 81/44   Pulse: (!) 52 (!) 47  (!) 47  Resp: '12  13 14  '$ Temp: 97.6 F (36.4 C) (!) 97.5 F (36.4 C)    TempSrc:  Oral    SpO2: 97% 94%  94%  Weight:      Height:       Body mass index is 28.12 kg/m.  General:  WDWN in NAD Gait: Normal HENT: WNL Eyes: Pupils equal Pulmonary: normal non-labored breathing , without Rales, rhonchi,  wheezing Cardiac: RRR, without  Murmurs, rubs or gallops; No carotid bruits Abdomen: soft, NT, no masses Skin: no rashes, ulcers noted;  no Gangrene , no cellulitis; no open wounds;   Vascular Exam/Pulses:radial, femorals, and right DP No ischemic changes B LE    Musculoskeletal: no muscle wasting or atrophy; no edema  Neurologic: A&O X 3; Appropriate Affect ;  SENSATION: normal; MOTOR FUNCTION: Symmetric Speech is fluent/normal   Significant Diagnostic Studies: CBC Lab Results  Component Value Date   WBC 6.6 05/23/2022   HGB 9.7 (L) 05/23/2022   HCT 31.4 (L) 05/23/2022   MCV 97.8 05/23/2022   PLT 138 (L) 05/23/2022    BMET    Component Value Date/Time   NA 135 05/23/2022 0359   K 5.1 05/23/2022 0359   CL 109 05/23/2022 0359   CO2 17 (L) 05/23/2022 0359   GLUCOSE 144 (H) 05/23/2022 0359   BUN 136 (H) 05/23/2022 0359   CREATININE 5.31 (H) 05/23/2022 0359   CALCIUM 8.5 (L) 05/23/2022 0359   GFRNONAA 7 (L) 05/23/2022 0359   GFRAA 35 (L) 07/10/2016 0405   Estimated Creatinine Clearance: 8.1 mL/min (A) (by C-G formula based on SCr of 5.31 mg/dL (H)).  COAG Lab Results  Component Value Date   INR 1.14 07/10/2016   INR 0.99 07/09/2016   INR 2.17 (H) 01/10/2015     Non-Invasive Vascular Imaging:   CT without contrast Large  focal outpouching of the left aortic arch measuring 2.3 x 2.3 cm on series 2 image 20. Additional small focal outpouching of the posterior margin of the descending thoracic aorta measuring 8 mm on series 2, image 43. Severe coronary artery calcifications.  ASSESSMENT/PLAN:  Weakness generalize with Left aortic arch aneurysm and UTI She is sleepy, but arousal.  No ischemic skin changes on B LE. No CP/T-L spine pain Tolerating PO The Large 2.3 cm focal outpouching of the left aortic arch would be better visualized with CTA.  Her CR is currently elevated.  We hope with gentle hydration we can reduce the Cr.  History of CKD   Dr. Donzetta Matters will discuss plan of care and possible TEVAR fixation in the near future.       Roxy Horseman 05/23/2022 9:07 AM   I have independently interviewed and examined patient and agree with PA assessment and plan above.  Noncontrasted CT scan reviewed.  Unfortunately patient would require CTA prior to considering any repair but she is not stable enough at this time and also has acute kidney injury.  No current role for vascular intervention but will be available during this hospitalization as needed.  Sharmin Foulk C. Donzetta Matters, MD [facility] [place] Office: [phone] Pager: [phone]

## 2022-05-23 NOTE — Significant Event (Addendum)
Rapid Response Event Note   Reason for Call :  Hypotension 69/37  Initial Focused Assessment:  Pt lying in bed, lethargic, oriented. Lung sounds are clear. Pt c/o abdominal pain. Abdomen is round, soft. Pt having had bowel movement on my assessment. Pt denies pain beyond her abdominal discomfort. She endorses dizziness. Moving all extremities equally. Skin is warm, dry, non-tenting.   VS: T 97.64F, BP 85/48, HR 57, RR 20, SpO2 97% on 3LNC CBG: 129  Interventions:  -Second PIV established -Levophed gtt initiated  Plan of Care:  -Vasopressors -Transfer to ICU  Event Summary:  MD Notified: Dr. Cruzita Lederer Call Time: [id number] Arrival Time: 1440 End Time: 1620, pt in ICU 2M07  Casimer Bilis, RN

## 2022-05-23 NOTE — Progress Notes (Signed)
Pharmacy Antibiotic Note  Lisa Lucas is a 87 y.o. female admitted on 06/02/2022 with sepsis.  Pharmacy has been consulted for vanc/cefepime dosing.  Pt presented with suspected UTI and left aortic arch pseudoaneurysm vs penetrating atherosclerotic ulcer. She was started on ceftriaxone but it's being transition to vanc/cefepime.  Scr 5.31  Plan: Vanc 1.25g IV x1  Re-dose when level <15 Cefepime 1g IV q24  Height: '5\' 5"'$  (165.1 cm) Weight: 76.7 kg (169 lb) IBW/kg (Calculated) : 57  Temp (24hrs), Avg:97.5 F (36.4 C), Min:97.3 F (36.3 C), Max:97.8 F (36.6 C)  Recent Labs  Lab 05/29/2022 1551 06/08/2022 1638 06/18/2022 1858 05/23/22 0359  WBC 6.5  --   --  6.6  CREATININE 5.24*  --   --  5.31*  LATICACIDVEN  --  1.3 1.0  --     Estimated Creatinine Clearance: 8.1 mL/min (A) (by C-G formula based on SCr of 5.31 mg/dL (H)).    Allergies  Allergen Reactions   Hydrocodone Nausea Only   Morphine And Related Nausea Only   Oxycodone Nausea Only    Antimicrobials this admission: 3/4 ceftriaxone x1 3/5 cefepime>> 3/5 vanc>>  Dose adjustments this admission:   Microbiology results: 3/5 urine>>   Onnie Boer, PharmD, [place], AAHIVP, CPP Infectious Disease Pharmacist 05/23/2022 3:30 PM

## 2022-05-23 NOTE — Progress Notes (Signed)
Peripherally Inserted Central Catheter Placement  The IV Nurse has discussed with the patient and/or persons authorized to consent for the patient, the purpose of this procedure and the potential benefits and risks involved with this procedure.  The benefits include less needle sticks, lab draws from the catheter, and the patient may be discharged home with the catheter. Risks include, but not limited to, infection, bleeding, blood clot (thrombus formation), and puncture of an artery; nerve damage and irregular heartbeat and possibility to perform a PICC exchange if needed/ordered by physician.  Alternatives to this procedure were also discussed.  Bard Power PICC patient education guide, fact sheet on infection prevention and patient information card has been provided to patient /or left at bedside.    PICC Placement Documentation  PICC Triple Lumen [id number] Right Basilic 39 cm 0 cm (Active)  Indication for Insertion or Continuance of Line Vasoactive infusions 05/23/22 1718  Exposed Catheter (cm) 0 cm 05/23/22 1718  Site Assessment Clean, Dry, Intact 05/23/22 1718  Lumen #1 Status Flushed;Blood return noted;Saline locked 05/23/22 1718  Lumen #2 Status Flushed;Blood return noted;Saline locked 05/23/22 1718  Lumen #3 Status Flushed;Blood return noted;Saline locked 05/23/22 1718  Dressing Type Transparent 05/23/22 1718  Dressing Status Antimicrobial disc in place 05/23/22 [place] Not Applicable [id number]  Line Care Connections checked and tightened 05/23/22 1718  Line Adjustment (NICU/IV Team Only) No 05/23/22 1718  Dressing Intervention New dressing 05/23/22 1718  Dressing Change Due 05/30/22 05/23/22 1718       Scotty Court 05/23/2022, 5:21 PM

## 2022-05-24 ENCOUNTER — Other Ambulatory Visit (HOSPITAL_COMMUNITY): Payer: 59

## 2022-05-24 DIAGNOSIS — R6521 Severe sepsis with septic shock: Secondary | ICD-10-CM | POA: Diagnosis not present

## 2022-05-24 DIAGNOSIS — A419 Sepsis, unspecified organism: Secondary | ICD-10-CM | POA: Diagnosis not present

## 2022-05-24 DIAGNOSIS — G9341 Metabolic encephalopathy: Secondary | ICD-10-CM | POA: Diagnosis not present

## 2022-05-24 LAB — COMPREHENSIVE METABOLIC PANEL
ALT: 8 U/L (ref 0–44)
AST: 13 U/L — ABNORMAL LOW (ref 15–41)
Albumin: 2.2 g/dL — ABNORMAL LOW (ref 3.5–5.0)
Alkaline Phosphatase: 54 U/L (ref 38–126)
Anion gap: 11 (ref 5–15)
BUN: 103 mg/dL — ABNORMAL HIGH (ref 8–23)
CO2: 29 mmol/L (ref 22–32)
Calcium: 6.9 mg/dL — ABNORMAL LOW (ref 8.9–10.3)
Chloride: 97 mmol/L — ABNORMAL LOW (ref 98–111)
Creatinine, Ser: 4.38 mg/dL — ABNORMAL HIGH (ref 0.44–1.00)
GFR, Estimated: 9 mL/min — ABNORMAL LOW (ref >60)
Glucose, Bld: 130 mg/dL — ABNORMAL HIGH (ref 70–99)
Potassium: 4.4 mmol/L (ref 3.5–5.1)
Sodium: 137 mmol/L (ref 135–145)
Total Bilirubin: 0.4 mg/dL (ref 0.3–1.2)
Total Protein: 4.7 g/dL — ABNORMAL LOW (ref 6.5–8.1)

## 2022-05-24 LAB — POCT I-STAT 7, (LYTES, BLD GAS, ICA,H+H)
Acid-base deficit: 11 mmol/L — ABNORMAL HIGH (ref 0.0–2.0)
Acid-base deficit: 11 mmol/L — ABNORMAL HIGH (ref 0.0–2.0)
Bicarbonate: 19.6 mmol/L — ABNORMAL LOW (ref 20.0–28.0)
Bicarbonate: 17.9 mmol/L — ABNORMAL LOW (ref 20.0–28.0)
Calcium, Ion: 1.21 mmol/L (ref 1.15–1.40)
Calcium, Ion: 1.18 mmol/L (ref 1.15–1.40)
HCT: 32 % — ABNORMAL LOW (ref 36.0–46.0)
HCT: 32 % — ABNORMAL LOW (ref 36.0–46.0)
Hemoglobin: 10.9 g/dL — ABNORMAL LOW (ref 12.0–15.0)
Hemoglobin: 10.9 g/dL — ABNORMAL LOW (ref 12.0–15.0)
O2 Saturation: 88 %
O2 Saturation: 98 %
Patient temperature: 97.8
Patient temperature: 97.8
Potassium: 5.2 mmol/L — ABNORMAL HIGH (ref 3.5–5.1)
Potassium: 5.2 mmol/L — ABNORMAL HIGH (ref 3.5–5.1)
Sodium: 138 mmol/L (ref 135–145)
Sodium: 138 mmol/L (ref 135–145)
TCO2: 22 mmol/L (ref 22–32)
TCO2: 20 mmol/L — ABNORMAL LOW (ref 22–32)
pCO2 arterial: 70.1 mmHg (ref 32–48)
pCO2 arterial: 55 mmHg — ABNORMAL HIGH (ref 32–48)
pH, Arterial: 7.051 — CL (ref 7.35–7.45)
pH, Arterial: 7.119 — CL (ref 7.35–7.45)
pO2, Arterial: 78 mmHg — ABNORMAL LOW (ref 83–108)
pO2, Arterial: 145 mmHg — ABNORMAL HIGH (ref 83–108)

## 2022-05-24 LAB — CBC
HCT: 28.4 % — ABNORMAL LOW (ref 36.0–46.0)
Hemoglobin: 8.5 g/dL — ABNORMAL LOW (ref 12.0–15.0)
MCH: 29.7 pg (ref 26.0–34.0)
MCHC: 29.9 g/dL — ABNORMAL LOW (ref 30.0–36.0)
MCV: 99.3 fL (ref 80.0–100.0)
Platelets: 160 10*3/uL (ref 150–400)
RBC: 2.86 MIL/uL — ABNORMAL LOW (ref 3.87–5.11)
RDW: 14.8 % (ref 11.5–15.5)
WBC: 6.2 10*3/uL (ref 4.0–10.5)
nRBC: 0 % (ref 0.0–0.2)

## 2022-05-24 LAB — GLUCOSE, CAPILLARY: Glucose-Capillary: 134 mg/dL — ABNORMAL HIGH (ref 70–99)

## 2022-05-24 LAB — MAGNESIUM: Magnesium: 1.8 mg/dL (ref 1.7–2.4)

## 2022-05-24 LAB — HEPARIN LEVEL (UNFRACTIONATED): Heparin Unfractionated: >1.1 IU/mL — ABNORMAL HIGH (ref 0.30–0.70)

## 2022-05-24 LAB — APTT: aPTT: 117 seconds — ABNORMAL HIGH (ref 24–36)

## 2022-05-24 MED ORDER — GLYCOPYRROLATE 0.2 MG/ML IJ SOLN: 0.2000 mg | INTRAMUSCULAR | Status: DC | PRN

## 2022-05-24 MED ORDER — NALOXONE HCL 0.4 MG/ML IJ SOLN
INTRAMUSCULAR | Status: AC
  Filled 2022-05-24: qty 1

## 2022-05-24 MED ORDER — GLYCOPYRROLATE 1 MG PO TABS: 1.0000 mg | ORAL_TABLET | ORAL | Status: DC | PRN

## 2022-05-24 MED ORDER — GLYCOPYRROLATE 0.2 MG/ML IJ SOLN
0.2000 mg | INTRAMUSCULAR | Status: DC | PRN
  Administered 2022-05-24: 0.2 mg via INTRAVENOUS
  Filled 2022-05-24: qty 1

## 2022-05-24 MED ORDER — MORPHINE SULFATE (PF) 2 MG/ML IV SOLN
2.0000 mg | INTRAVENOUS | Status: DC | PRN
  Administered 2022-05-24: 2 mg via INTRAVENOUS
  Administered 2022-05-24: 4 mg via INTRAVENOUS
  Filled 2022-05-24 (×2): qty 2

## 2022-05-24 MED ORDER — NALOXONE HCL 0.4 MG/ML IJ SOLN
0.4000 mg | INTRAMUSCULAR | Status: DC | PRN
  Administered 2022-05-24: 0.4 mg via INTRAVENOUS

## 2022-05-24 MED ORDER — SODIUM CHLORIDE 0.9 % IV SOLN: INTRAVENOUS | Status: DC

## 2022-05-24 MED ORDER — POLYVINYL ALCOHOL 1.4 % OP SOLN: 1.0000 [drp] | Freq: Four times a day (QID) | OPHTHALMIC | Status: DC | PRN

## 2022-05-25 NOTE — Progress Notes (Signed)
eLink Physician-Brief Progress Note Patient Name: Lisa Lucas DOB: 1935/06/11 MRN: [id number]   Date of Service  06/17/2022  HPI/Events of Note  87 year old female with stage IV CKD, COPD on home oxygen and chronic anticoagulation for DVT/PE who presented with acute setting of pneumonia and aortic aneurysm found to have acute on chronic kidney injury.  Patient was transitioned to comfort measures earlier on 3/6.  eICU Interventions  Pt expired @ 2327     Intervention Category Evaluation Type: Other  Nohemy Koop 06/17/2022, 12:49 AM

## 2022-05-25 NOTE — Progress Notes (Signed)
Time of death 2327 called with Earney Hamburg, RN. No breath or heart sounds auscultated. Pupils fixed and dilated. E-link notified.

## 2022-06-19 NOTE — Death Summary Note (Signed)
  Name: Lisa Lucas MRN: [id number] DOB: 1935/05/26 87 y.o.  Date of Admission: 06/12/2022  4:12 PM Date of Discharge: 05/20/2022 Attending Physician: No att. providers found  Discharge Diagnosis: Principal Problem:   Acute metabolic encephalopathy Active Problems:   AKI (acute kidney injury) ([place])   HTN (hypertension)   Chronic anticoagulation   UTI (urinary tract infection)   Generalized weakness   COPD (chronic obstructive pulmonary disease) ([facility])   Hyperkalemia   Septic shock ([facility])   Stage 4 chronic kidney disease ([place])  Cause of death: Cardiac arrest Time of death: 2325-06-24  Disposition and follow-up:   Lisa Lucas was discharged from [facility] in expired condition.    Hospital Course: 87 yo F with a hx of CKD IV, COPD on home 3L O2, hx DVT/PE on chronic Eliquis, HTN, and dementia presented to [facility] for UTI and malaise, found to have a 2.3 cm focal outpouching of L aortic arch, subsequently transferred to [facility] for vascular evaluation. On arrival, patient was found hypotensive and admitted to the ICU for refractory hypotension likely in the setting of septic shock in setting of UTI. She was found to have AKI on CKD IV, thus vascular surgery held off further imaging until improvement While hospitalized, she required pressor support and was treated with broad spectrum antibiotics. Her course was complicated by acute metabolic encephalopathy, which was multifactorial, but worsened by uremia and anuria. Given patient's wishes to not undergo dialysis, GOC were discussed with HCPOA, [name], and family members at bedside. They confirmed that patient would not want to undergo dialysis. Family opted to transition patient to comfort care, withdrawing pressor support on 3/6 in the afternoon. Patient expired at 06-24-25. Family notified by night RN.  Signed: Romana Juniper, MD 05/19/2022, 8:40 AM

## 2022-06-19 NOTE — Progress Notes (Signed)
    1600  Spiritual Encounters  Type of Visit Initial  Care provided to: Family  Conversation partners present during encounter Nurse  Referral source Family  Reason for visit End-of-life  OnCall Visit No  Spiritual Framework  Community/Connection Family  Family Stress Factors  (anticipatory grief)  Interventions  Spiritual Care Interventions Made Compassionate presence;Prayer   Ch responded to request for emotional and spiritual support. Pt's family at bedside. Ch provided compassionate presence, touch, and lead the family with a word of prayer. No follow-up needed at this time.

## 2022-06-19 NOTE — Progress Notes (Signed)
ANTICOAGULATION CONSULT NOTE - Follow Up Consult  Pharmacy Consult for heparin Indication: DVT  Labs: Recent Labs    06/08/2022 1551 06/13/2022 1639 05/31/2022 1858 05/23/22 0359 05/23/22 1648  0335  0425  0432  HGB 10.6*  --   --  9.7*  --  8.5* 10.9* 10.9*  HCT 35.1*  --   --  31.4*  --  28.4* 32.0* 32.0*  PLT 157  --   --  138*  --  160  --   --   APTT  --   --   --   --   --  117*  --   --   HEPARINUNFRC  --   --   --   --   --  >1.10*  --   --   CREATININE 5.24*  --   --  5.31* 5.04* 4.38*  --   --   TROPONINIHS  --  7 6  --   --   --   --   --     Assessment: 87yo female supratherapeutic on heparin with initial dosing for DVT; no infusion issues or signs of bleeding per RN.  Goal of Therapy:  aPTT 66-102 seconds   Plan:  Will decrease heparin infusion by 2 units/kg/hr to 700 units/hr and check PTT in 8 hours.    Wynona Neat, PharmD, BCPS  ,5:25 AM

## 2022-06-19 NOTE — IPAL (Signed)
  Interdisciplinary Goals of Care Family Meeting   Date carried out:   Location of the meeting: Bedside  Member's involved: Physician and Family Member or next of kin  Durable Power of Attorney or acting medical decision maker: Lisa Lucas, daughter/HCPOA    Discussion: We discussed goals of care for Lisa Lucas .  We reviewed her clinical course including worsening mental status in the setting of renal failure. She is on broad spectrum antibiotics for her infection and on low dose pressors. Family/HCPOA acknowledges that she has had improved pressor requirement and slightly improved renal function however in the setting of her CKD and slow recovery, her mental status will likely increase risk of complications and prolong her hospitalization. After discussion with remaining family members, decision was made to transition to comfort care  Code status:   Code Status: DNR   Disposition: In-patient comfort care  Time spent for the meeting: 15 min    Lisa Khokhar Rodman Pickle, MD  , 12:00 PM

## 2022-06-19 NOTE — Progress Notes (Signed)
NAME:  Lisa Lucas, MRN:  [id number], DOB:  1935/10/14, LOS: 2 ADMISSION DATE:  06/04/2022, CONSULTATION DATE:  3/5 REFERRING MD:  Dr. Renne Crigler, CHIEF COMPLAINT:  Shock    History of Present Illness:  87 year old female with past medical history as below, which is significant for COPD on 3L home oxygen, DVT/PE on Eliquis, hypertension, hyperlipidemia, and CKD stage IV. Family also reports an additional history of dementia. Patient lives at home. Patient presented to [facility] emergency department on 3/4 with complaints of fatigue and malaise for the preceding 2 weeks and dysuria for the preceding 5 days. She also reported poor p.o. intake for 1 week. She was seen by PCP 2 days prior to presentation and started on Macrobid for UTI. She was started on empiric antibiotics for UTI. CT of the chest and abdomen demonstrated a focal outpouching of the left aortic arch. She was transferred to [facility] for evaluation by vascular surgery. 3/5 she was noted to be hypotensive, which was refractory to 4+ liters of IV fluid resuscitation. PCCM was consulted for further evaluation and potential ICU transfer   Pertinent  Medical History  has a past medical history of Arthritis, Sun's cyst, Cancer ([place]), COPD (chronic obstructive pulmonary disease) ([place]), DVT (deep venous thrombosis) ([place]) (12/2010), History of kidney cancer, Hypercholesteremia, Hypertension, Osteoporosis, Peripheral vascular disease ([place]), Pneumonia, PONV (postoperative nausea and vomiting), Pulmonary embolism ([place]) (12/2010), Renal insufficiency, and Spinal stenosis.   Significant Hospital Events: Including procedures, antibiotic start and stop dates in addition to other pertinent events   3/4 present to [facility] ED with c/o fatigue, dysuria, weakness, Tx to [facility] for vascular eval of aortic aneurysms.  3/5 Hypotensive despite IVF, lethargic, tx to ICU for pressors. Bradycardic, tachypnea, encephalopathy Placed on levo  Interim History /  Subjective:  Patient is somnolent and unable to participate in conversation Ilink physician spoke with familly about [place]; introduced the option of comfort measures given patient's desires to not pursue dialysis. She remains on [place], levophed, and BS antibiotic coverage. Continued to be anuric.  Daughter [name] and granddaughter at beside confirmed patient does not want dialysis, is DNR. They would like to wait until family, who are 3 hours away, come to the bedside to fully considering comfort measures. They are aware of the prognosis of Ms. [place]'s condition. [name] states that if patient's pressor requirements increase, they would transition to comfort care even if desired family members are not at bedside  Objective   Blood pressure (!) 113/52, pulse (!) 55, temperature 97.8 F (36.6 C), temperature source Axillary, resp. rate 10, height '5\' 5"'$  (1.651 m), weight 76.9 kg, SpO2 98 %.        Intake/Output Summary (Last 24 hours) at  0746 Last data filed at  0600 Gross per 24 hour  Intake 2907.46 ml  Output 50 ml  Net 2857.46 ml   Filed Weights   05/21/2022 1529 05/23/22 1935  Weight: 76.7 kg 76.9 kg    Examination: General: Acutely ill appearing elderly woman, laying in bed with [place] and breathing with mouth open HENT: MMM, [place] in place Lungs: agonal breathing presentCTAB, no wheezing noted on exam Cardiovascular: bradycardia, nor murmurs Abdomen: soft, non tender, non distended Extremities: Warm and dry, diminished peripheral  Neuro: encephalopathic, somnolent, arousable to verbal stimuli, unable to participate in conversation GU: [place] in place  Significant labs Wbc 6.2  Hgb 8.5  PLT 160 CMP Na137/K 4.4/Cl 97/HCO3 29/BUN103/Cr4.38 /GFR 9- AST13/ALT8 Albumin 2.2  ABG: 7.11/55/145 from 7.05/70 MRSA  PCR swab negative U/A  leukocytes, WBC, bacteria Unable to send Urine culture- patient anuric  CT chest, abd, pelvis:  L aortic arch outpouching c/f pseudoaneurysm  vs atherosclerotic ulcer, R bladder wall diverticulum, wall thickening, prevesicular fat stranding, diverticulosis without diverticulitis Resolved Hospital Problem list     Assessment & Plan:   Acute metabolic encephalopathy Secondary uremia in setting of aki on CKD4 c/b septic shock  -on Vancomycin and cefepime -continue pressor support with Levophed 2 mcg  -Discontinued centrally acting medications  Acute on chronic hypoxic hypercarbic respiratory failure, improving COPD with home 3L [place] High risk of aspiration 2/2 encephalopathy Now on 5L [place], wean as able Continue on duonebs Continue on Brovana, Pulmicort, and yupelri Sat goal 88-93%    Shock Septic likely secondary to UTI vs hypovolemic R bladder wall diverticulum, wall thickening, prevesicular fat stranding c/f cystitis on CT A/b Reduced Levo requirement to 75mg Continue on vancomycin and cefepime Unable to collect urine cultures 2/2 anuria  Focal outpouching of L aortic arch 2.3 cm Incidental finding C/f pseudoaneurysm cs atherosclerotic ulcer Vascular sx initially consulted; recommended CTA chest with Cr improvement Will continue to monitor; patient is currently being considered for comfort measures  Bradycardia On pressor support, currently stable Reviewed EKG Continue telemetry Consider TTE if patient is not progressed to comfort care  Chronic normocytic anemia Likely secondary to CKD IV -Stable -Continue to monitor in setting of uremia and heparin therapy  Hx of DVT -on heparin ggt -monitor level -monitor hgb and platelets  AKI on CKD IV Metabolic acidosis, now resolved Anuric now uremic Transient improvement Patient does not wish to have dialysis Family considering comfort care Continue pressor support for HDS Discontinued bicarbonate ggt Continue to trend BMP Currently not able to take PO meds - will resume lokelma if able to take PO -replace electrolytes as able  Nutrition Will keep patient  NPO Continue to monitor CBG with SSI if BG >180  Goals of care [name] - pt's daughter and POA at bedside. Patient's granddaughter is an RTherapist, sports Understanding of prognosis. Will re-engage family on comfort measures once whole family at bedside (they live 3 hrs away). IF patient on increased pressor support, family    Best Practice (right click and "Reselect all SmartList Selections" daily)   Diet/type: NPO DVT prophylaxis: prophylactic heparin  GI prophylaxis: PPI Lines: N/A Foley:  N/A Code Status:  DNR Last date of multidisciplinary goals of care discussion [Health care power of attorney is [name] - daughter. Grandadaughter is an antepartum RTherapist, sportsand is bedside as well.   Labs   CBC: Recent Labs  Lab 05/29/2022 1551 05/23/22 0359  0335  0425  0432  WBC 6.5 6.6 6.2  --   --   NEUTROABS  --  5.3  --   --   --   HGB 10.6* 9.7* 8.5* 10.9* 10.9*  HCT 35.1* 31.4* 28.4* 32.0* 32.0*  MCV 98.0 97.8 99.3  --   --   PLT 157 138* 160  --   --     Basic Metabolic Panel: Recent Labs  Lab 06/04/2022 1551 06/04/2022 1858 05/23/22 0359 05/23/22 1648  0335  0425  0432  NA 134*  --  135 138 137 138 138  K 5.4*   < > 5.1 5.3* 4.4 5.2* 5.2*  CL 105  --  109 114* 97*  --   --   CO2 16*  --  17* 13* 29  --   --   GLUCOSE 108*  --  144* 161* 130*  --   --   BUN 133*  --  136* 125* 103*  --   --   CREATININE 5.24*  --  5.31* 5.04* 4.38*  --   --   CALCIUM 8.9  --  8.5* 8.1* 6.9*  --   --   MG  --   --  2.3  --  1.8  --   --    < > = values in this interval not displayed.   GFR: Estimated Creatinine Clearance: 9.8 mL/min (A) (by C-G formula based on SCr of 4.38 mg/dL (H)). Recent Labs  Lab 06/02/2022 1551 05/19/2022 1638 06/02/2022 1858 05/23/22 0359  0335  WBC 6.5  --   --  6.6 6.2  LATICACIDVEN  --  1.3 1.0  --   --     Liver Function Tests: Recent Labs  Lab  1639 05/23/22 0359  0335  AST 22 18 13*  ALT '8 8 8   '$ ALKPHOS 73 60 54  BILITOT 0.8 0.5 0.4  PROT 7.0 5.7* 4.7*  ALBUMIN 3.6 2.7* 2.2*   Recent Labs  Lab 06/03/2022 1639  LIPASE 47   No results for input(s): "AMMONIA" in the last 168 hours.  ABG    Component Value Date/Time   PHART 7.119 (LL)  0432   PCO2ART 55.0 (H)  0432   PO2ART 145 (H)  0432   HCO3 17.9 (L)  0432   TCO2 20 (L)  0432   ACIDBASEDEF 11.0 (H)  0432   O2SAT 98  0432     Coagulation Profile: No results for input(s): "INR", "PROTIME" in the last 168 hours.  Cardiac Enzymes: No results for input(s): "CKTOTAL", "CKMB", "CKMBINDEX", "TROPONINI" in the last 168 hours.  HbA1C: No results found for: "HGBA1C"  CBG: Recent Labs  Lab 06/12/2022 1538 05/23/22 1454 05/23/22 1919  0401  GLUCAP 94 129* 165* 134*    Review of Systems:     Past Medical History:  She,  has a past medical history of Arthritis, [place]'s cyst, Cancer ([place]), COPD (chronic obstructive pulmonary disease) ([place]), DVT (deep venous thrombosis) ([place]) (12/2010), History of kidney cancer, Hypercholesteremia, Hypertension, Osteoporosis, Peripheral vascular disease ([place]), Pneumonia, PONV (postoperative nausea and vomiting), Pulmonary embolism ([place]) (12/2010), Renal insufficiency, and Spinal stenosis.   Surgical History:   Past Surgical History:  Procedure Laterality Date   ABDOMINAL HYSTERECTOMY     APPENDECTOMY     BACK SURGERY     COLONOSCOPY  12/29/2011   Procedure: COLONOSCOPY;  Surgeon: Danie Binder, MD;  Location: [facility];  Service: Endoscopy;  Laterality: N/A;  11:30   HERNIA REPAIR     KIDNEY SURGERY  2010   bilateral renal cancer s/p bilateral partial nephrectomy   LEG SURGERY     right leg for blood clot   LUMBAR LAMINECTOMY  04/16/2012   Dr Joya Salm   LUMBAR LAMINECTOMY/DECOMPRESSION MICRODISCECTOMY  01/30/2012   Procedure: LUMBAR LAMINECTOMY/DECOMPRESSION MICRODISCECTOMY 1 LEVEL;  Surgeon:  Floyce Stakes, MD;  Location: [facility];  Service: Neurosurgery;  Laterality: Left;  Left Lumbar Four-Five Diskectomy   LUMBAR LAMINECTOMY/DECOMPRESSION MICRODISCECTOMY  04/16/2012   Procedure: LUMBAR LAMINECTOMY/DECOMPRESSION MICRODISCECTOMY 1 LEVEL;  Surgeon: Floyce Stakes, MD;  Location: [facility];  Service: Neurosurgery;  Laterality: Left;  Left Lumbar four-five Redo Diskectomy     Social History:   reports that she quit smoking about 11 years ago. Her smoking use included cigarettes. She has a 15.00 pack-year smoking history. She  has been exposed to tobacco smoke. She has never used smokeless tobacco. She reports that she does not drink alcohol and does not use drugs.   Family History:  Her family history includes Colon cancer in her brother and daughter.   Allergies Allergies  Allergen Reactions   Hydrocodone Nausea Only   Morphine And Related Nausea Only   Oxycodone Nausea Only     Home Medications  Prior to Admission medications   Medication Sig Start Date End Date Taking? Authorizing Provider  acetaminophen (TYLENOL) 500 MG tablet Take 1,000 mg by mouth every 6 (six) hours as needed for moderate pain.   Yes [provider]  albuterol (PROVENTIL) (5 MG/ML) 0.5% nebulizer solution Take 0.5 mLs (2.5 mg total) by nebulization every 2 (two) hours as needed for wheezing or shortness of breath. 04/20/12  Yes Kristeen Miss, MD  allopurinol (ZYLOPRIM) 100 MG tablet Take 50 mg by mouth every other day.   Yes [provider]  apixaban (ELIQUIS) 5 MG TABS tablet Take 2 tablets (10 mg total) by mouth 2 (two) times daily. 07/10/16  Yes Laqueta Linden, MD  calcitRIOL (ROCALTROL) 0.25 MCG capsule Take 0.25 mcg by mouth 3 (three) times a week.   Yes [provider]  Calcium Carb-Cholecalciferol (CALCIUM 600/VITAMIN D3) 600-800 MG-UNIT TABS Take 1 tablet by mouth every morning.    Yes [provider]  carvedilol (COREG) 6.25 MG tablet Take 6.25 mg  by mouth 2 (two) times daily with a meal. 09/04/16  Yes [provider]  chlorthalidone (HYGROTON) 25 MG tablet Take 12.5 mg by mouth daily.   Yes [provider]  DULoxetine (CYMBALTA) 30 MG capsule Take 30 mg by mouth daily. 05/12/22  Yes [provider]  hydrALAZINE (APRESOLINE) 50 MG tablet Take 50 mg by mouth 3 (three) times daily. 03/17/22 03/17/23 Yes [provider]  levocetirizine (XYZAL) 5 MG tablet Take 5 mg by mouth at bedtime as needed.   Yes [provider]  pregabalin (LYRICA) 50 MG capsule Take 50 mg by mouth 2 (two) times daily.   Yes [provider]  sodium zirconium cyclosilicate (LOKELMA) 5 g packet Take by mouth.   Yes [provider]  telmisartan (MICARDIS) 20 MG tablet Take 20 mg by mouth daily.   Yes [provider]  TRELEGY ELLIPTA 100-62.5-25 MCG/ACT AEPB Inhale 1 puff into the lungs daily. 09/26/18  Yes [provider]     Critical care time:    Romana Juniper, MD [facility] Internal Medicine Program - PGY-1 , 10:50 AM

## 2022-06-19 DEATH — deceased
